# Patient Record
Sex: Female | Born: 1945
Health system: Southern US, Community
[De-identification: ages and names within clinical notes are randomized; demographics above are authoritative.]

## PROBLEM LIST (undated history)

## (undated) DIAGNOSIS — I10 Essential (primary) hypertension: Secondary | ICD-10-CM

## (undated) DIAGNOSIS — R112 Nausea with vomiting, unspecified: Secondary | ICD-10-CM

## (undated) DIAGNOSIS — M1990 Unspecified osteoarthritis, unspecified site: Secondary | ICD-10-CM

## (undated) DIAGNOSIS — H33319 Horseshoe tear of retina without detachment, unspecified eye: Secondary | ICD-10-CM

## (undated) DIAGNOSIS — E785 Hyperlipidemia, unspecified: Secondary | ICD-10-CM

## (undated) DIAGNOSIS — Z9889 Other specified postprocedural states: Secondary | ICD-10-CM

## (undated) HISTORY — PX: CATARACT EXTRACTION: SUR2

## (undated) HISTORY — DX: Hyperlipidemia, unspecified: E78.5

## (undated) HISTORY — DX: Other specified postprocedural states: Z98.890

## (undated) HISTORY — PX: RETINAL TEAR REPAIR CRYOTHERAPY: SHX5304

## (undated) HISTORY — DX: Essential (primary) hypertension: I10

## (undated) HISTORY — DX: Horseshoe tear of retina without detachment, unspecified eye: H33.319

## (undated) HISTORY — DX: Nausea with vomiting, unspecified: R11.2

## (undated) HISTORY — DX: Unspecified osteoarthritis, unspecified site: M19.90

## (undated) HISTORY — PX: POLYPECTOMY: SHX149

---
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
## 2000-06-03 ENCOUNTER — Other Ambulatory Visit: Admission: RE | Admit: 2000-06-03 | Discharge: 2000-06-03 | Payer: Self-pay | Admitting: Obstetrics and Gynecology

## 2000-06-04 ENCOUNTER — Other Ambulatory Visit: Admission: RE | Admit: 2000-06-04 | Discharge: 2000-06-04 | Payer: Self-pay | Admitting: Obstetrics and Gynecology

## 2003-02-05 ENCOUNTER — Emergency Department (HOSPITAL_COMMUNITY): Admission: EM | Admit: 2003-02-05 | Discharge: 2003-02-05 | Payer: Self-pay | Admitting: Emergency Medicine

## 2003-05-03 ENCOUNTER — Other Ambulatory Visit: Admission: RE | Admit: 2003-05-03 | Discharge: 2003-05-03 | Payer: Self-pay | Admitting: Obstetrics and Gynecology

## 2003-05-06 ENCOUNTER — Encounter: Payer: Self-pay | Admitting: Obstetrics and Gynecology

## 2003-05-06 ENCOUNTER — Ambulatory Visit (HOSPITAL_COMMUNITY): Admission: RE | Admit: 2003-05-06 | Discharge: 2003-05-06 | Payer: Self-pay | Admitting: Obstetrics and Gynecology

## 2003-06-04 ENCOUNTER — Encounter: Payer: Self-pay | Admitting: Obstetrics and Gynecology

## 2003-06-04 ENCOUNTER — Encounter: Admission: RE | Admit: 2003-06-04 | Discharge: 2003-06-04 | Payer: Self-pay | Admitting: Obstetrics and Gynecology

## 2003-06-24 ENCOUNTER — Ambulatory Visit (HOSPITAL_COMMUNITY): Admission: RE | Admit: 2003-06-24 | Discharge: 2003-06-24 | Payer: Self-pay | Admitting: Otolaryngology

## 2003-06-24 ENCOUNTER — Encounter (INDEPENDENT_AMBULATORY_CARE_PROVIDER_SITE_OTHER): Payer: Self-pay | Admitting: Specialist

## 2003-06-24 ENCOUNTER — Ambulatory Visit (HOSPITAL_BASED_OUTPATIENT_CLINIC_OR_DEPARTMENT_OTHER): Admission: RE | Admit: 2003-06-24 | Discharge: 2003-06-24 | Payer: Self-pay | Admitting: Otolaryngology

## 2004-07-07 ENCOUNTER — Encounter: Admission: RE | Admit: 2004-07-07 | Discharge: 2004-07-07 | Payer: Self-pay | Admitting: Obstetrics and Gynecology

## 2004-08-09 ENCOUNTER — Ambulatory Visit: Payer: Self-pay | Admitting: Sports Medicine

## 2004-12-20 ENCOUNTER — Ambulatory Visit: Payer: Self-pay | Admitting: Internal Medicine

## 2004-12-22 ENCOUNTER — Ambulatory Visit: Payer: Self-pay | Admitting: Internal Medicine

## 2005-01-05 ENCOUNTER — Ambulatory Visit: Payer: Self-pay | Admitting: Internal Medicine

## 2005-01-26 ENCOUNTER — Ambulatory Visit: Payer: Self-pay | Admitting: Cardiology

## 2005-06-18 ENCOUNTER — Ambulatory Visit: Payer: Self-pay | Admitting: Internal Medicine

## 2005-06-22 ENCOUNTER — Ambulatory Visit: Payer: Self-pay | Admitting: Internal Medicine

## 2005-09-27 ENCOUNTER — Ambulatory Visit: Payer: Self-pay | Admitting: Internal Medicine

## 2005-09-28 ENCOUNTER — Ambulatory Visit: Payer: Self-pay | Admitting: Internal Medicine

## 2005-10-19 ENCOUNTER — Encounter: Admission: RE | Admit: 2005-10-19 | Discharge: 2005-10-19 | Payer: Self-pay | Admitting: Obstetrics and Gynecology

## 2006-04-23 ENCOUNTER — Ambulatory Visit: Payer: Self-pay | Admitting: Internal Medicine

## 2006-04-26 ENCOUNTER — Ambulatory Visit: Payer: Self-pay | Admitting: Internal Medicine

## 2006-08-23 ENCOUNTER — Ambulatory Visit: Payer: Self-pay | Admitting: Internal Medicine

## 2006-08-23 LAB — CONVERTED CEMR LAB
ALT: 35 units/L (ref 0–40)
AST: 25 units/L (ref 0–37)
Albumin: 3.6 g/dL (ref 3.5–5.2)
Alkaline Phosphatase: 68 units/L (ref 39–117)
BUN: 22 mg/dL (ref 6–23)
CO2: 27 meq/L (ref 19–32)
Calcium: 9.2 mg/dL (ref 8.4–10.5)
Chloride: 101 meq/L (ref 96–112)
Chol/HDL Ratio, serum: 4.7
Cholesterol: 222 mg/dL (ref 0–200)
Creatinine, Ser: 0.8 mg/dL (ref 0.4–1.2)
GFR calc non Af Amer: 78 mL/min
Glomerular Filtration Rate, Af Am: 94 mL/min/{1.73_m2}
Glucose, Bld: 115 mg/dL — ABNORMAL HIGH (ref 70–99)
HDL: 47.4 mg/dL (ref >39.0)
LDL DIRECT: 148.9 mg/dL
Potassium: 4.3 meq/L (ref 3.5–5.1)
Sodium: 136 meq/L (ref 135–145)
Total Bilirubin: 0.6 mg/dL (ref 0.3–1.2)
Total Protein: 6.3 g/dL (ref 6.0–8.3)
Triglyceride fasting, serum: 161 mg/dL — ABNORMAL HIGH (ref 0–149)
VLDL: 32 mg/dL (ref 0–40)

## 2006-08-30 ENCOUNTER — Ambulatory Visit: Payer: Self-pay | Admitting: Internal Medicine

## 2006-09-17 DIAGNOSIS — H33319 Horseshoe tear of retina without detachment, unspecified eye: Secondary | ICD-10-CM

## 2006-09-17 HISTORY — DX: Horseshoe tear of retina without detachment, unspecified eye: H33.319

## 2007-01-30 ENCOUNTER — Ambulatory Visit: Payer: Self-pay | Admitting: Internal Medicine

## 2007-01-30 LAB — CONVERTED CEMR LAB
ALT: 40 units/L (ref 0–40)
AST: 23 units/L (ref 0–37)
Albumin: 3.5 g/dL (ref 3.5–5.2)
Alkaline Phosphatase: 63 units/L (ref 39–117)
BUN: 16 mg/dL (ref 6–23)
CO2: 29 meq/L (ref 19–32)
Calcium: 9 mg/dL (ref 8.4–10.5)
Chloride: 100 meq/L (ref 96–112)
Cholesterol: 233 mg/dL (ref 0–200)
Creatinine, Ser: 0.8 mg/dL (ref 0.4–1.2)
Direct LDL: 141 mg/dL
GFR calc Af Amer: 94 mL/min
GFR calc non Af Amer: 78 mL/min
Glucose, Bld: 111 mg/dL — ABNORMAL HIGH (ref 70–99)
HDL: 49.5 mg/dL (ref >39.0)
Potassium: 4.7 meq/L (ref 3.5–5.1)
Sodium: 136 meq/L (ref 135–145)
Total Bilirubin: 0.6 mg/dL (ref 0.3–1.2)
Total CHOL/HDL Ratio: 4.7
Total Protein: 6.6 g/dL (ref 6.0–8.3)
Triglycerides: 165 mg/dL — ABNORMAL HIGH (ref 0–149)
VLDL: 33 mg/dL (ref 0–40)

## 2007-01-31 ENCOUNTER — Ambulatory Visit: Payer: Self-pay | Admitting: Internal Medicine

## 2007-10-03 ENCOUNTER — Telehealth: Payer: Self-pay | Admitting: Internal Medicine

## 2007-10-04 ENCOUNTER — Telehealth: Payer: Self-pay | Admitting: Internal Medicine

## 2007-10-27 ENCOUNTER — Ambulatory Visit: Payer: Self-pay | Admitting: Internal Medicine

## 2007-10-27 DIAGNOSIS — E7800 Pure hypercholesterolemia, unspecified: Secondary | ICD-10-CM

## 2007-10-27 LAB — CONVERTED CEMR LAB: Vit D, 1,25-Dihydroxy: 35 (ref 30–89)

## 2007-10-28 ENCOUNTER — Ambulatory Visit (HOSPITAL_COMMUNITY): Admission: RE | Admit: 2007-10-28 | Discharge: 2007-10-28 | Payer: Self-pay | Admitting: Ophthalmology

## 2007-10-28 LAB — CONVERTED CEMR LAB
BUN: 16 mg/dL (ref 6–23)
CO2: 26 meq/L (ref 19–32)
Calcium: 10 mg/dL (ref 8.4–10.5)
Chloride: 103 meq/L (ref 96–112)
Cholesterol: 285 mg/dL (ref 0–200)
Creatinine, Ser: 0.7 mg/dL (ref 0.4–1.2)
Direct LDL: 196.5 mg/dL
GFR calc Af Amer: 109 mL/min
GFR calc non Af Amer: 90 mL/min
Glucose, Bld: 113 mg/dL — ABNORMAL HIGH (ref 70–99)
HDL: 53.7 mg/dL (ref >39.0)
Hgb A1c MFr Bld: 6 % (ref 4.6–6.0)
Potassium: 3.7 meq/L (ref 3.5–5.1)
Sodium: 138 meq/L (ref 135–145)
Total CHOL/HDL Ratio: 5.3
Triglycerides: 173 mg/dL — ABNORMAL HIGH (ref 0–149)
VLDL: 35 mg/dL (ref 0–40)

## 2007-10-31 ENCOUNTER — Ambulatory Visit: Payer: Self-pay | Admitting: Internal Medicine

## 2007-10-31 DIAGNOSIS — E785 Hyperlipidemia, unspecified: Secondary | ICD-10-CM

## 2007-10-31 DIAGNOSIS — I10 Essential (primary) hypertension: Secondary | ICD-10-CM | POA: Insufficient documentation

## 2007-10-31 DIAGNOSIS — H33039 Retinal detachment with giant retinal tear, unspecified eye: Secondary | ICD-10-CM | POA: Insufficient documentation

## 2008-04-28 ENCOUNTER — Ambulatory Visit: Payer: Self-pay | Admitting: Internal Medicine

## 2008-04-28 ENCOUNTER — Encounter: Payer: Self-pay | Admitting: Internal Medicine

## 2008-04-28 LAB — CONVERTED CEMR LAB
ALT: 43 units/L — ABNORMAL HIGH (ref 0–35)
AST: 32 units/L (ref 0–37)
Albumin: 3.9 g/dL (ref 3.5–5.2)
Alkaline Phosphatase: 65 units/L (ref 39–117)
BUN: 13 mg/dL (ref 6–23)
Basophils Absolute: 0 10*3/uL (ref 0.0–0.1)
Basophils Relative: 0.8 % (ref 0.0–3.0)
Bilirubin, Direct: 0.1 mg/dL (ref 0.0–0.3)
CO2: 29 meq/L (ref 19–32)
Calcium: 9.7 mg/dL (ref 8.4–10.5)
Chloride: 102 meq/L (ref 96–112)
Cholesterol: 286 mg/dL (ref 0–200)
Creatinine, Ser: 0.7 mg/dL (ref 0.4–1.2)
Direct LDL: 213.6 mg/dL
Eosinophils Absolute: 0.1 10*3/uL (ref 0.0–0.7)
Eosinophils Relative: 2 % (ref 0.0–5.0)
GFR calc Af Amer: 109 mL/min
GFR calc non Af Amer: 90 mL/min
Glucose, Bld: 110 mg/dL — ABNORMAL HIGH (ref 70–99)
HCT: 43 % (ref 36.0–46.0)
HDL: 52.6 mg/dL (ref >39.0)
Hemoglobin: 15 g/dL (ref 12.0–15.0)
Hgb A1c MFr Bld: 5.9 % (ref 4.6–6.0)
Lymphocytes Relative: 32.5 % (ref 12.0–46.0)
MCHC: 34.8 g/dL (ref 30.0–36.0)
MCV: 88 fL (ref 78.0–100.0)
Monocytes Absolute: 0.5 10*3/uL (ref 0.1–1.0)
Monocytes Relative: 12 % (ref 3.0–12.0)
Neutro Abs: 2.1 10*3/uL (ref 1.4–7.7)
Neutrophils Relative %: 52.7 % (ref 43.0–77.0)
Platelets: 234 10*3/uL (ref 150–400)
Potassium: 4.1 meq/L (ref 3.5–5.1)
RBC: 4.89 M/uL (ref 3.87–5.11)
RDW: 11.9 % (ref 11.5–14.6)
Sodium: 139 meq/L (ref 135–145)
Total Bilirubin: 0.7 mg/dL (ref 0.3–1.2)
Total CHOL/HDL Ratio: 5.4
Total Protein: 7 g/dL (ref 6.0–8.3)
Triglycerides: 122 mg/dL (ref 0–149)
VLDL: 24 mg/dL (ref 0–40)
Vitamin B-12: 294 pg/mL (ref 211–911)
WBC: 4 10*3/uL — ABNORMAL LOW (ref 4.5–10.5)

## 2008-04-30 ENCOUNTER — Ambulatory Visit: Payer: Self-pay | Admitting: Internal Medicine

## 2008-04-30 DIAGNOSIS — E559 Vitamin D deficiency, unspecified: Secondary | ICD-10-CM

## 2008-05-01 ENCOUNTER — Emergency Department (HOSPITAL_COMMUNITY): Admission: EM | Admit: 2008-05-01 | Discharge: 2008-05-01 | Payer: Self-pay | Admitting: Family Medicine

## 2008-05-03 LAB — CONVERTED CEMR LAB: Vit D, 1,25-Dihydroxy: 39 (ref 30–89)

## 2008-06-22 ENCOUNTER — Encounter: Payer: Self-pay | Admitting: Internal Medicine

## 2008-07-15 ENCOUNTER — Encounter: Payer: Self-pay | Admitting: Internal Medicine

## 2008-07-15 ENCOUNTER — Ambulatory Visit: Payer: Self-pay | Admitting: Internal Medicine

## 2008-07-15 DIAGNOSIS — J018 Other acute sinusitis: Secondary | ICD-10-CM | POA: Insufficient documentation

## 2008-07-15 DIAGNOSIS — R05 Cough: Secondary | ICD-10-CM

## 2008-08-06 ENCOUNTER — Ambulatory Visit: Payer: Self-pay | Admitting: Internal Medicine

## 2008-08-06 DIAGNOSIS — J209 Acute bronchitis, unspecified: Secondary | ICD-10-CM | POA: Insufficient documentation

## 2008-09-28 ENCOUNTER — Ambulatory Visit (HOSPITAL_COMMUNITY): Admission: RE | Admit: 2008-09-28 | Discharge: 2008-09-28 | Payer: Self-pay | Admitting: Ophthalmology

## 2008-12-06 ENCOUNTER — Ambulatory Visit: Payer: Self-pay | Admitting: Internal Medicine

## 2008-12-06 LAB — CONVERTED CEMR LAB
ALT: 28 units/L (ref 0–35)
AST: 22 units/L (ref 0–37)
Albumin: 3.6 g/dL (ref 3.5–5.2)
Alkaline Phosphatase: 47 units/L (ref 39–117)
BUN: 20 mg/dL (ref 6–23)
Basophils Absolute: 0 10*3/uL (ref 0.0–0.1)
Basophils Relative: 0.7 % (ref 0.0–3.0)
Bilirubin, Direct: 0.1 mg/dL (ref 0.0–0.3)
CO2: 29 meq/L (ref 19–32)
Calcium: 9.3 mg/dL (ref 8.4–10.5)
Chloride: 107 meq/L (ref 96–112)
Cholesterol: 272 mg/dL — ABNORMAL HIGH (ref 0–200)
Creatinine, Ser: 0.6 mg/dL (ref 0.4–1.2)
Direct LDL: 173.7 mg/dL
Eosinophils Absolute: 0 10*3/uL (ref 0.0–0.7)
Eosinophils Relative: 1 % (ref 0.0–5.0)
GFR calc non Af Amer: 107.37 mL/min (ref >60)
Glucose, Bld: 122 mg/dL — ABNORMAL HIGH (ref 70–99)
HCT: 40.5 % (ref 36.0–46.0)
HDL: 47.9 mg/dL (ref >39.00)
Hemoglobin: 14 g/dL (ref 12.0–15.0)
Hgb A1c MFr Bld: 5.9 % (ref 4.6–6.5)
Lymphocytes Relative: 33.8 % (ref 12.0–46.0)
Lymphs Abs: 1.6 10*3/uL (ref 0.7–4.0)
MCHC: 34.6 g/dL (ref 30.0–36.0)
MCV: 88.1 fL (ref 78.0–100.0)
Monocytes Absolute: 0.3 10*3/uL (ref 0.1–1.0)
Monocytes Relative: 6 % (ref 3.0–12.0)
Neutro Abs: 2.9 10*3/uL (ref 1.4–7.7)
Neutrophils Relative %: 58.5 % (ref 43.0–77.0)
Platelets: 229 10*3/uL (ref 150.0–400.0)
Potassium: 4.6 meq/L (ref 3.5–5.1)
RBC: 4.6 M/uL (ref 3.87–5.11)
RDW: 11.8 % (ref 11.5–14.6)
Sodium: 143 meq/L (ref 135–145)
Total Bilirubin: 0.5 mg/dL (ref 0.3–1.2)
Total CHOL/HDL Ratio: 6
Total Protein: 6.6 g/dL (ref 6.0–8.3)
Triglycerides: 200 mg/dL — ABNORMAL HIGH (ref 0.0–149.0)
VLDL: 40 mg/dL (ref 0.0–40.0)
WBC: 4.8 10*3/uL (ref 4.5–10.5)

## 2008-12-10 ENCOUNTER — Ambulatory Visit: Payer: Self-pay | Admitting: Internal Medicine

## 2009-07-13 ENCOUNTER — Telehealth: Payer: Self-pay | Admitting: Internal Medicine

## 2009-08-15 ENCOUNTER — Ambulatory Visit: Payer: Self-pay | Admitting: Internal Medicine

## 2009-08-15 LAB — CONVERTED CEMR LAB
ALT: 32 units/L (ref 0–35)
AST: 24 units/L (ref 0–37)
Albumin: 3.5 g/dL (ref 3.5–5.2)
Alkaline Phosphatase: 68 units/L (ref 39–117)
BUN: 17 mg/dL (ref 6–23)
Bilirubin, Direct: 0 mg/dL (ref 0.0–0.3)
CO2: 28 meq/L (ref 19–32)
Calcium: 9.1 mg/dL (ref 8.4–10.5)
Chloride: 104 meq/L (ref 96–112)
Cholesterol: 274 mg/dL — ABNORMAL HIGH (ref 0–200)
Creatinine, Ser: 0.7 mg/dL (ref 0.4–1.2)
Direct LDL: 209.1 mg/dL
GFR calc non Af Amer: 89.67 mL/min (ref >60)
Glucose, Bld: 108 mg/dL — ABNORMAL HIGH (ref 70–99)
HDL: 55.2 mg/dL (ref >39.00)
Hgb A1c MFr Bld: 5.8 % (ref 4.6–6.5)
Potassium: 4.4 meq/L (ref 3.5–5.1)
Sodium: 139 meq/L (ref 135–145)
TSH: 0.64 microintl units/mL (ref 0.35–5.50)
Total Bilirubin: 0.8 mg/dL (ref 0.3–1.2)
Total CHOL/HDL Ratio: 5
Total Protein: 6.5 g/dL (ref 6.0–8.3)
Triglycerides: 98 mg/dL (ref 0.0–149.0)
VLDL: 19.6 mg/dL (ref 0.0–40.0)

## 2009-08-19 ENCOUNTER — Ambulatory Visit: Payer: Self-pay | Admitting: Internal Medicine

## 2009-08-19 DIAGNOSIS — M79609 Pain in unspecified limb: Secondary | ICD-10-CM | POA: Insufficient documentation

## 2009-08-19 DIAGNOSIS — F4321 Adjustment disorder with depressed mood: Secondary | ICD-10-CM | POA: Insufficient documentation

## 2010-03-07 ENCOUNTER — Ambulatory Visit: Payer: Self-pay | Admitting: Internal Medicine

## 2010-03-07 LAB — CONVERTED CEMR LAB
ALT: 25 units/L (ref 0–35)
AST: 22 units/L (ref 0–37)
Albumin: 3.9 g/dL (ref 3.5–5.2)
Alkaline Phosphatase: 66 units/L (ref 39–117)
BUN: 14 mg/dL (ref 6–23)
Bilirubin, Direct: 0.1 mg/dL (ref 0.0–0.3)
CO2: 29 meq/L (ref 19–32)
Calcium: 9.1 mg/dL (ref 8.4–10.5)
Chloride: 108 meq/L (ref 96–112)
Creatinine, Ser: 0.7 mg/dL (ref 0.4–1.2)
GFR calc non Af Amer: 91.01 mL/min (ref >60)
Glucose, Bld: 102 mg/dL — ABNORMAL HIGH (ref 70–99)
Potassium: 4.5 meq/L (ref 3.5–5.1)
Sodium: 143 meq/L (ref 135–145)
TSH: 0.74 microintl units/mL (ref 0.35–5.50)
Total Bilirubin: 0.6 mg/dL (ref 0.3–1.2)
Total Protein: 6.8 g/dL (ref 6.0–8.3)

## 2010-03-10 ENCOUNTER — Ambulatory Visit: Payer: Self-pay | Admitting: Internal Medicine

## 2010-06-30 ENCOUNTER — Ambulatory Visit: Payer: Self-pay | Admitting: Internal Medicine

## 2010-10-19 NOTE — Assessment & Plan Note (Signed)
Summary: 6 mos f/u // #?cd   Vital Signs:  Patient profile:   65 year old female Height:      60 inches Weight:      139 pounds BMI:     27.24 O2 Sat:      95 % on Room air Temp:     97.6 degrees F oral Resp:     16 per minute BP sitting:   110 / 60  (left arm) Cuff size:   regular  Vitals Entered By: Lanier Prude, CMA(AAMA) (March 10, 2010 2:24 PM)  O2 Flow:  Room air CC: 6 mo f/u Comments pt is not taking Lipitor 10mg  or Pennsaid    CC:  6 mo f/u.  History of Present Illness: The patient presents for a follow up of hypertension, elev glu, hyperlipidemia   Current Medications (verified): 1)  Zestoretic 10-12.5 Mg  Tabs (Lisinopril-Hydrochlorothiazide) .Marland Kitchen.. 1 By Mouth Qd 2)  Caltrate 600+d 600-400 Mg-Unit  Tabs (Calcium Carbonate-Vitamin D) .... 2 By Mouth Qd 3)  Vitamin D 1000 Unit  Tabs (Cholecalciferol) .... Two Times A Day 4)  Fish Oil 1000 Mg  Caps (Omega-3 Fatty Acids) .... Two Times A Day 5)  B-12 250 Mcg  Tabs (Cyanocobalamin) .Marland Kitchen.. 1 By Mouth Qd 6)  Aspirin 81 Mg  Tbec (Aspirin) .... One By Mouth Every Day 7)  Lipitor 10 Mg Tabs (Atorvastatin Calcium) .Marland Kitchen.. 1 By Mouth Qd 8)  Pennsaid 1.5 % Soln (Diclofenac Sodium) .... 3-5 Gtt On Skin Three Times A Day For Pain 9)  Flax Seed Oil 1000 Mg Caps (Flaxseed (Linseed)) .Marland Kitchen.. 1 Once Daily  Allergies (verified): 1)  ! Fish Oil (Fish Oil) 2)  Mevacor 3)  Crestor (Rosuvastatin Calcium) 4)  Vytorin 5)  Lipitor  Past History:  Past Medical History: Last updated: 04/30/2008 Hyperlipidemia Hypertension Cataract R 2009 Dr Nile Riggs Retina tear B 2008 Dr Gregery Na Vit D def Low nl  Vit B12  Social History: Last updated: 07/15/2008 Occupation: Nutritionist Single Never Smoked Alcohol use-no Drug use-no Regular exercise-yes  Physical Exam  General:  Well-developed,well-nourished,in no acute distress; alert,appropriate and cooperative throughout examination Nose:  Erythematous throat mucosa and intranasal  erythema.  Mouth:  hoarse Lungs:  Normal respiratory effort, chest expands symmetrically. Lungs are clear to auscultation, no crackles or wheezes. Heart:  Normal rate and regular rhythm. S1 and S2 normal without gallop, murmur, click, rub or other extra sounds. Abdomen:  Bowel sounds positive,abdomen soft and non-tender without masses, organomegaly or hernias noted. Msk:  normal ROM.   Extremities:  No clubbing, cyanosis, edema, or deformity noted with normal full range of motion of all joints.   Neurologic:  No cranial nerve deficits noted. Station and gait are normal. Plantar reflexes are down-going bilaterally. DTRs are symmetrical throughout. Sensory, motor and coordinative functions appear intact. Skin:  turgor normal, color normal, no rashes, and no edema.   Psych:  Cognition and judgment appear intact. Alert and cooperative with normal attention span and concentration. No apparent delusions, illusions, hallucinations Not tearful.     Impression & Recommendations:  Problem # 1:  HYPERTENSION (ICD-401.9) Assessment Improved  Her updated medication list for this problem includes:    Zestoretic 10-12.5 Mg Tabs (Lisinopril-hydrochlorothiazide) .Marland Kitchen... 1 by mouth qd  BP today: 110/60 Prior BP: 144/90 (08/19/2009)  Prior 10 Yr Risk Heart Disease: Not enough information (07/15/2008)  Labs Reviewed: K+: 4.5 (03/07/2010) Creat: : 0.7 (03/07/2010)   Chol: 274 (08/15/2009)   HDL: 55.20 (08/15/2009)   LDL: DEL (  04/28/2008)   TG: 98.0 (08/15/2009)  Problem # 2:  ABNORMAL GLUCOSE NEC (ICD-790.29) Assessment: Unchanged  Problem # 3:  HYPERLIPIDEMIA (ICD-272.4) Assessment: Comment Only  Her updated medication list for this problem includes:    Lipitor 10 Mg Tabs (Atorvastatin calcium) .Marland Kitchen... 1 by mouth qd    Livalo 4 Mg Tabs (Pitavastatin calcium) .Marland Kitchen... 1 by mouth once daily to try  Complete Medication List: 1)  Zestoretic 10-12.5 Mg Tabs (Lisinopril-hydrochlorothiazide) .Marland Kitchen.. 1 by mouth  qd 2)  Caltrate 600+d 600-400 Mg-unit Tabs (Calcium carbonate-vitamin d) .... 2 by mouth qd 3)  Vitamin D 1000 Unit Tabs (Cholecalciferol) .... Two times a day 4)  Fish Oil 1000 Mg Caps (Omega-3 fatty acids) .... Two times a day 5)  B-12 250 Mcg Tabs (Cyanocobalamin) .Marland Kitchen.. 1 by mouth qd 6)  Aspirin 81 Mg Tbec (Aspirin) .... One by mouth every day 7)  Lipitor 10 Mg Tabs (Atorvastatin calcium) .Marland Kitchen.. 1 by mouth qd 8)  Pennsaid 1.5 % Soln (Diclofenac sodium) .... 3-5 gtt on skin three times a day for pain 9)  Flax Seed Oil 1000 Mg Caps (Flaxseed (linseed)) .Marland Kitchen.. 1 once daily 10)  Livalo 4 Mg Tabs (Pitavastatin calcium) .Marland Kitchen.. 1 by mouth qd  Other Orders: Zoster (Shingles) Vaccine Live [phone]) Admin 1st Vaccine (60454) Admin 1st Vaccine [facility]) [phone])  Patient Instructions: 1)  Please schedule a follow-up appointment in 4 months. 2)  BMP prior to visit, ICD-9: 3)  Hepatic Panel prior to visit, ICD-9: 4)  Lipid Panel prior to visit, ICD-9: 5)  HbgA1C prior to visit, ICD-9:790.29  272.0 Prescriptions: ZESTORETIC 10-12.5 MG  TABS (LISINOPRIL-HYDROCHLOROTHIAZIDE) 1 by mouth qd  #90 x 3   Entered and Authorized by:   Tresa Garter MD   Signed by:   Tresa Garter MD on 03/10/2010   Method used:   Electronically to        [facility]* (retail)       [address]       [address] Shipping/mailing       [place], [place]  [address]       Ph: [phone]       Fax: [phone]   RxID:   [id number]    Zostavax # 1    Vaccine Type: Zostavax    Site: left deltoid    Mfr: Merck    Dose: 0.5 ml    Route: Bison    Given by: Lanier Prude, CMA(AAMA)    Exp. Date: 02/24/2011    Lot #: [id number]    VIS given: 06/29/05 given March 10, 2010.

## 2011-01-01 LAB — BASIC METABOLIC PANEL
BUN: 16 mg/dL (ref 6–23)
CO2: 29 mEq/L (ref 19–32)
Calcium: 10 mg/dL (ref 8.4–10.5)
Chloride: 100 mEq/L (ref 96–112)
Creatinine, Ser: 0.6 mg/dL (ref 0.4–1.2)
GFR calc Af Amer: >60 mL/min (ref >60)
GFR calc non Af Amer: >60 mL/min (ref >60)
Glucose, Bld: 134 mg/dL — ABNORMAL HIGH (ref 70–99)
Potassium: 3.9 mEq/L (ref 3.5–5.1)
Sodium: 139 mEq/L (ref 135–145)

## 2011-01-01 LAB — GLUCOSE, CAPILLARY: Glucose-Capillary: 105 mg/dL — ABNORMAL HIGH (ref 70–99)

## 2011-01-01 LAB — HEMOGLOBIN AND HEMATOCRIT, BLOOD
HCT: 42.5 % (ref 36.0–46.0)
Hemoglobin: 14.3 g/dL (ref 12.0–15.0)

## 2011-02-02 NOTE — H&P (Signed)
Lisa [name], Lisa [name]                            ACCOUNT NO.:  [id number]   MEDICAL RECORD NO.:  [id number]                   PATIENT TYPE:  AMB   LOCATION:  DSC                                  FACILITY:  [facility]   PHYSICIAN:  Hermelinda Medicus, M.D.                DATE OF BIRTH:  02-09-1946   DATE OF ADMISSION:  06/24/2003  DATE OF DISCHARGE:                                HISTORY & PHYSICAL   HISTORY OF PRESENT ILLNESS:  This patient is a 65 year old female who has  had difficulty breathing through her nose for several years.  She has a  septal deviation.  She has also had associated epistaxis.  She has had some  easy bruising.  She has been evaluated and found to be in excellent  condition.  Her coagulation status showed a pro time of 12, an INR of 0.8,  and a PTT of 31.  With this bleeding, she would have crusting within her  nose.  I think that the angulation of the septum was aggravating this  considerably.  She now enters for a septum reconstruction and turbinate  reduction in an effort to minimize the bleeding from her nose and also to  maximize her breathing.   MEDICATIONS:  1. Lotensin HC 10/12.5 mg daily.  2. Crestor 20 mg daily.  3. Calcium 600 mg daily.  4. Vitamin C.   ALLERGIES:  She has an allergy to DENTAL ANESTHETIC, TOPICAL, causing  blisters.   PAST SURGICAL HISTORY:  1. Polypectomy of her cervix.  2. Appendectomy.   PHYSICAL EXAMINATION:  GENERAL APPEARANCE:  She is a well-nourished, well-  developed female in no acute distress.  VITAL SIGNS:  Blood pressure 117/70.  WEIGHT:  131 pounds.  HEIGHT:  5 feet 1 inch.  HEENT:  The ears are clear.  The oropharynx is clear.  The nose shows a  septal deviation where the septum was pushed over to her right and then as a  severe spur more posteriorly in the vomerine region, but has also the high  ethmoid septal deviation.  The turbinates are also hypertrophied.  The  larynx is clear.  The true cords, false cords,  epiglottis, and base of  tongue are clear.  True cord mobility, gag reflex, tongue, EOMs, and facial  nerve are symmetrical.  No salivary gland abnormalities.  The teeth and gums  are within normal limits.  NECK:  Free of any thyromegaly, cervical adenopathy, or mass.  CHEST:  Clear.  No rales, rhonchi, or wheezes.  CARDIOVASCULAR:  No murmurs, rubs, or gallops.  She does have a questionable  infarction on EKG, age undetermined.  ABDOMEN:  Unremarkable.  EXTREMITIES:  Unremarkable.   INITIAL DIAGNOSES:  1. Septal deviation.  2. Turbinate hypertrophy with nasal obstruction.  3. History of epistaxis.  4. History of undetermined timeframe for a septal infarction.  5. History of appendectomy.  6.  History of cervical polypectomy.  7. History of allergy to dental topical medication.                                                Hermelinda Medicus, M.D.    JC/MEDQ  D:  06/24/2003  T:  06/24/2003  Job:  [id number]   cc:   Georgina Quint. [name], M.D. [facility]

## 2011-02-02 NOTE — Op Note (Signed)
   [name], [name]                            ACCOUNT NO.:  [id number]   MEDICAL RECORD NO.:  [id number]                   PATIENT TYPE:  AMB   LOCATION:  DSC                                  FACILITY:  [facility]   PHYSICIAN:  Hermelinda Medicus, M.D.                DATE OF BIRTH:  1946/04/18   DATE OF PROCEDURE:  06/24/2003  DATE OF DISCHARGE:                                 OPERATIVE REPORT   PREOPERATIVE DIAGNOSIS:  Septal deviation, turbinate hypertrophy with  epistaxis with nasal obstruction.   POSTOPERATIVE DIAGNOSIS:  Septal deviation, turbinate hypertrophy with  epistaxis with nasal obstruction.   PROCEDURE:  Septal reconstruction, turbinate reduction.   SURGEON:  Hermelinda Medicus, M.D.   ANESTHESIA:  Local with MAC.   DESCRIPTION OF PROCEDURE:  The patient was placed in the supine position and  under local anesthesia (1% Xylocaine with epinephrine) injectable, and  topical cocaine 200 mg, the hemitransfixion incision was made on the left  side, carried around to the right side and carried back along the  quadrilateral cartilage to the high ethmoid septal deviation which was  corrected by removing a strip of cartilage.  A posterior aspect cartilage  was also taken and then the ethmoid septal deviation was also taken using  the open and closed Hansen-Middletons and Takahashi forceps.  There was a  severe vomerine septal deviation that was also taken down using the  [facility] forceps.  Once this was brought back into position, the septum was  established in the midline and then the closure was begun using 5-0 plain  catgut and the through and through septal suture using 4-0 plain x2 as a  hemostatic stitch as well as to minimize swelling.  The patient tolerated  the procedure very well and is doing well postoperatively.  Intranasal  dressing was placed using Maricell pack 8 cm and she is to return to see me  in the office tomorrow, then Monday, then in 2-1/2 weeks, and 6  weeks.                                               Hermelinda Medicus, M.D.    JC/MEDQ  D:  06/24/2003  T:  06/24/2003  Job:  [id number]   cc:   Georgina Quint. [name], M.D. [facility]

## 2011-02-05 ENCOUNTER — Other Ambulatory Visit: Payer: Self-pay | Admitting: Internal Medicine

## 2011-02-05 ENCOUNTER — Other Ambulatory Visit (INDEPENDENT_AMBULATORY_CARE_PROVIDER_SITE_OTHER): Payer: Commercial Managed Care - PPO | Admitting: Internal Medicine

## 2011-02-05 ENCOUNTER — Other Ambulatory Visit (INDEPENDENT_AMBULATORY_CARE_PROVIDER_SITE_OTHER): Payer: Commercial Managed Care - PPO

## 2011-02-05 DIAGNOSIS — Z0000 Encounter for general adult medical examination without abnormal findings: Secondary | ICD-10-CM

## 2011-02-05 DIAGNOSIS — Z13220 Encounter for screening for lipoid disorders: Secondary | ICD-10-CM

## 2011-02-05 LAB — BASIC METABOLIC PANEL
BUN: 16 mg/dL (ref 6–23)
CO2: 28 mEq/L (ref 19–32)
Calcium: 9.3 mg/dL (ref 8.4–10.5)
Chloride: 99 mEq/L (ref 96–112)
Creatinine, Ser: 0.6 mg/dL (ref 0.4–1.2)
GFR: 110.89 mL/min (ref >60.00)
Glucose, Bld: 95 mg/dL (ref 70–99)
Potassium: 4.5 mEq/L (ref 3.5–5.1)
Sodium: 136 mEq/L (ref 135–145)

## 2011-02-05 LAB — TSH: TSH: 0.9 u[IU]/mL (ref 0.35–5.50)

## 2011-02-05 LAB — URINALYSIS
Bilirubin Urine: NEGATIVE
Hgb urine dipstick: NEGATIVE
Ketones, ur: NEGATIVE
Leukocytes, UA: NEGATIVE
Nitrite: NEGATIVE
Specific Gravity, Urine: <=1.005 (ref 1.000–1.030)
Total Protein, Urine: NEGATIVE
Urine Glucose: NEGATIVE
Urobilinogen, UA: 0.2 (ref 0.0–1.0)
pH: 7.5 (ref 5.0–8.0)

## 2011-02-05 LAB — CBC WITH DIFFERENTIAL/PLATELET
Basophils Absolute: 0 10*3/uL (ref 0.0–0.1)
Basophils Relative: 0.2 % (ref 0.0–3.0)
Eosinophils Absolute: 0.1 10*3/uL (ref 0.0–0.7)
Eosinophils Relative: 1.3 % (ref 0.0–5.0)
HCT: 42 % (ref 36.0–46.0)
Hemoglobin: 14.3 g/dL (ref 12.0–15.0)
Lymphocytes Relative: 28.1 % (ref 12.0–46.0)
Lymphs Abs: 1.6 10*3/uL (ref 0.7–4.0)
MCHC: 34.1 g/dL (ref 30.0–36.0)
MCV: 89.4 fl (ref 78.0–100.0)
Monocytes Absolute: 0.5 10*3/uL (ref 0.1–1.0)
Monocytes Relative: 8 % (ref 3.0–12.0)
Neutro Abs: 3.6 10*3/uL (ref 1.4–7.7)
Neutrophils Relative %: 62.4 % (ref 43.0–77.0)
Platelets: 257 10*3/uL (ref 150.0–400.0)
RBC: 4.7 Mil/uL (ref 3.87–5.11)
RDW: 12.9 % (ref 11.5–14.6)
WBC: 5.8 10*3/uL (ref 4.5–10.5)

## 2011-02-05 LAB — HEPATIC FUNCTION PANEL
ALT: 22 U/L (ref 0–35)
AST: 17 U/L (ref 0–37)
Albumin: 3.7 g/dL (ref 3.5–5.2)
Alkaline Phosphatase: 57 U/L (ref 39–117)
Bilirubin, Direct: 0.1 mg/dL (ref 0.0–0.3)
Total Bilirubin: 0.5 mg/dL (ref 0.3–1.2)
Total Protein: 6.3 g/dL (ref 6.0–8.3)

## 2011-02-05 LAB — LIPID PANEL
Cholesterol: 245 mg/dL — ABNORMAL HIGH (ref 0–200)
HDL: 59.3 mg/dL (ref >39.00)
Total CHOL/HDL Ratio: 4
Triglycerides: 168 mg/dL — ABNORMAL HIGH (ref 0.0–149.0)
VLDL: 33.6 mg/dL (ref 0.0–40.0)

## 2011-02-05 LAB — LDL CHOLESTEROL, DIRECT: Direct LDL: 183.5 mg/dL

## 2011-02-09 ENCOUNTER — Encounter: Payer: Self-pay | Admitting: Internal Medicine

## 2011-02-09 ENCOUNTER — Ambulatory Visit (INDEPENDENT_AMBULATORY_CARE_PROVIDER_SITE_OTHER): Payer: 59 | Admitting: Internal Medicine

## 2011-02-09 DIAGNOSIS — Z0000 Encounter for general adult medical examination without abnormal findings: Secondary | ICD-10-CM

## 2011-02-09 DIAGNOSIS — E559 Vitamin D deficiency, unspecified: Secondary | ICD-10-CM

## 2011-02-09 DIAGNOSIS — I10 Essential (primary) hypertension: Secondary | ICD-10-CM

## 2011-02-09 DIAGNOSIS — E538 Deficiency of other specified B group vitamins: Secondary | ICD-10-CM

## 2011-02-09 DIAGNOSIS — R7309 Other abnormal glucose: Secondary | ICD-10-CM

## 2011-02-09 DIAGNOSIS — E785 Hyperlipidemia, unspecified: Secondary | ICD-10-CM

## 2011-02-09 MED ORDER — LISINOPRIL-HYDROCHLOROTHIAZIDE 10-12.5 MG PO TABS: 1.0000 | ORAL_TABLET | Freq: Every day | ORAL | Status: DC

## 2011-02-09 MED ORDER — METFORMIN HCL 500 MG PO TABS: 500.0000 mg | ORAL_TABLET | Freq: Two times a day (BID) | ORAL | Status: DC

## 2011-02-09 NOTE — Assessment & Plan Note (Signed)
On Rx 

## 2011-02-09 NOTE — Assessment & Plan Note (Signed)
On rx 

## 2011-02-09 NOTE — Assessment & Plan Note (Signed)
Use Metformin 1/2 or 1 qd

## 2011-02-09 NOTE — Progress Notes (Signed)
  Subjective:    Patient ID: Lisa [name], female    DOB: 01-13-1946, 65 y.o.   MRN: [id number]  HPI  The patient presents for a follow-up of  chronic hypertension, chronic dyslipidemia and elev glu controlled with diet some   Review of Systems  Constitutional: Negative for appetite change.  HENT: Negative for sneezing.   Respiratory: Negative for cough and choking.   Gastrointestinal: Negative for anal bleeding.  Genitourinary: Negative for decreased urine volume.  Musculoskeletal: Negative for back pain.  Skin: Negative for color change.  Neurological: Negative for numbness.  Psychiatric/Behavioral: Negative for dysphoric mood.       Wt Readings from Last 3 Encounters:  02/09/11 133 lb (60.328 kg)  03/10/10 139 lb (63.05 kg)  08/19/09 135 lb (61.236 kg)    Objective:   Physical Exam  Constitutional: She appears well-developed and well-nourished. No distress.  HENT:  Head: Normocephalic.  Right Ear: External ear normal.  Left Ear: External ear normal.  Nose: Nose normal.  Mouth/Throat: Oropharynx is clear and moist.  Eyes: Conjunctivae are normal. Pupils are equal, round, and reactive to light. Right eye exhibits no discharge. Left eye exhibits no discharge.  Neck: Normal range of motion. Neck supple. No JVD present. No tracheal deviation present. No thyromegaly present.  Cardiovascular: Normal rate, regular rhythm and normal heart sounds.   Pulmonary/Chest: No stridor. No respiratory distress. She has no wheezes.  Abdominal: Soft. Bowel sounds are normal. She exhibits no distension and no mass. There is no tenderness. There is no rebound and no guarding.  Musculoskeletal: She exhibits no edema and no tenderness.  Lymphadenopathy:    She has no cervical adenopathy.  Neurological: She displays normal reflexes. No cranial nerve deficit. She exhibits normal muscle tone. Coordination normal.  Skin: No rash noted. No erythema.  Psychiatric: She has a normal mood and affect.  Her behavior is normal. Judgment and thought content normal.        Lab Results  Component Value Date   WBC 5.8 02/05/2011   HGB 14.3 02/05/2011   HCT 42.0 02/05/2011   PLT 257.0 02/05/2011   CHOL 245* 02/05/2011   TRIG 168.0* 02/05/2011   HDL 59.30 02/05/2011   LDLDIRECT 183.5 02/05/2011   ALT 22 02/05/2011   AST 17 02/05/2011   NA 136 02/05/2011   K 4.5 02/05/2011   CL 99 02/05/2011   CREATININE 0.6 02/05/2011   BUN 16 02/05/2011   CO2 28 02/05/2011   TSH 0.90 02/05/2011   HGBA1C 5.8 08/15/2009      Assessment & Plan:  VITAMIN D DEFICIENCY On rx  ABNORMAL GLUCOSE NEC Use Metformin 1/2 or 1 qd  HYPERTENSION On Rx

## 2011-06-08 LAB — BASIC METABOLIC PANEL
BUN: 9
Chloride: 102
GFR calc Af Amer: >60
GFR calc non Af Amer: >60
Potassium: 4.6
Sodium: 137

## 2011-06-08 LAB — HEMOGLOBIN AND HEMATOCRIT, BLOOD
HCT: 43.4
Hemoglobin: 14.8

## 2011-07-30 ENCOUNTER — Ambulatory Visit: Payer: 59 | Admitting: Internal Medicine

## 2011-09-19 ENCOUNTER — Ambulatory Visit (INDEPENDENT_AMBULATORY_CARE_PROVIDER_SITE_OTHER): Payer: 59 | Admitting: Internal Medicine

## 2011-09-19 ENCOUNTER — Encounter: Payer: Self-pay | Admitting: Internal Medicine

## 2011-09-19 VITALS — BP 136/72 | HR 101 | Temp 98.5°F

## 2011-09-19 DIAGNOSIS — J209 Acute bronchitis, unspecified: Secondary | ICD-10-CM

## 2011-09-19 MED ORDER — HYDROCOD POLST-CHLORPHEN POLST 10-8 MG/5ML PO LQCR: 5.0000 mL | Freq: Two times a day (BID) | ORAL | Status: DC | PRN

## 2011-09-19 MED ORDER — AZITHROMYCIN 250 MG PO TABS: ORAL_TABLET | ORAL | Status: AC

## 2011-09-19 NOTE — Progress Notes (Signed)
  Subjective:    HPI  complains of cold symptoms  Onset >1 week ago, wax/wane symptoms  Initially associated with rhinorrhea, sneezing, sore throat, mild headache and low grade fever Also myalgias, sinus pressure and mild-mod chest congestion No relief with OTC meds Precipitated by sick contacts  Past Medical History  Diagnosis Date  . Hyperlipidemia   . Hypertension   . Cataract 2009    right-Dr. Nile Riggs  . Retinal tear 2008    B- Dr. Gregery Na  . Vitamin D deficiency     Review of Systems Constitutional: No fever or night sweats, no unexpected weight change Pulmonary: No pleurisy or hemoptysis Cardiovascular: No chest pain or palpitations     Objective:   Physical Exam BP 136/72  Pulse 101  Temp(Src) 98.5 F (36.9 C) (Oral)  SpO2 98% GEN: mildly ill appearing and audible head/chest congestion HENT: NCAT, mild sinus tenderness bilaterally, nares with clear discharge, oropharynx mild erythema, no exudate Eyes: Vision grossly intact, no conjunctivitis Lungs: Clear to auscultation, few rhonchi  But no wheeze, no increased work of breathing Cardiovascular: Regular rate and rhythm, no bilateral edema      Assessment & Plan:  Viral URI  Acute bronchitis Cough, postnasal drip related to above   Empiric antibiotics prescribed due to symptom duration greater than 7 days Prescription cough suppression - new prescriptions done Symptomatic care with Tylenol or Advil, hydration and rest -  salt gargle advised as needed

## 2011-09-19 NOTE — Patient Instructions (Signed)
It was good to see you today. Zpak antibiotics and Tussionex syrup for nighttime cough symptoms - Your prescription(s) have been submitted to your pharmacy. Please take as directed and contact our office if you believe you are having problem(s) with the medication(s). Also use Mucinex for cough, stay hydrated and rest!

## 2011-10-05 ENCOUNTER — Ambulatory Visit: Payer: 59 | Admitting: Internal Medicine

## 2011-11-20 ENCOUNTER — Ambulatory Visit (INDEPENDENT_AMBULATORY_CARE_PROVIDER_SITE_OTHER): Payer: 59 | Admitting: Internal Medicine

## 2011-11-20 ENCOUNTER — Encounter: Payer: Self-pay | Admitting: Internal Medicine

## 2011-11-20 VITALS — BP 136/88 | HR 106 | Temp 98.4°F | Resp 16 | Wt 129.8 lb

## 2011-11-20 DIAGNOSIS — R05 Cough: Secondary | ICD-10-CM

## 2011-11-20 MED ORDER — PREDNISONE 10 MG PO TABS: 10.0000 mg | ORAL_TABLET | Freq: Every day | ORAL | Status: DC

## 2011-11-20 MED ORDER — BENZONATATE 100 MG PO CAPS: 100.0000 mg | ORAL_CAPSULE | Freq: Three times a day (TID) | ORAL | Status: AC

## 2011-11-20 NOTE — Progress Notes (Signed)
  Subjective:    Patient ID: Lisa [name], female    DOB: 06/19/1946, 66 y.o.   MRN: [id number]  HPI Lisa [name] presents for persistent cough for 5 days with minimal production of mucus/sputum. She has had no fever or chills, no SOB/DOE. She was seen in Jan '13 diagnosed by Dr. Felicity Coyer with  Viral URI and bronchitis treated with z--pak and tussionex. She did improve but had an intermittent cough. Late last week she saw a patient who was sick and soon thereafter her symptoms got a lot worse with hacking cough that interferes with sleep, sinus pressure and post nasal drainage for which she has been taking benadryl at night.   Past Medical History  Diagnosis Date  . Hyperlipidemia   . Hypertension   . Cataract 2009    right-Dr. Nile Riggs  . Retinal tear 2008    B- Dr. Gregery Na  . Vitamin d deficiency    Past Surgical History  Procedure Date  . Cataract extraction     Dr. Nile Riggs  . Retinal tear repair cryotherapy     Dr. Gregery Na   Family History  Problem Relation Age of Onset  . COPD Father   . COPD Sister   . Hyperlipidemia Other    History   Social History  . Marital Status: Single    Spouse Name: N/A    Number of Children: N/A  . Years of Education: N/A   Occupational History  . nutritionist    Social History Main Topics  . Smoking status: Never Smoker   . Smokeless tobacco: Not on file  . Alcohol Use: No  . Drug Use: No  . Sexually Active: Not on file   Other Topics Concern  . Not on file   Social History Narrative   Regular exercise- Yes       Review of Systems System review is negative for any constitutional, cardiac, pulmonary, GI or neuro symptoms or complaints other than as described in the HPI.     Objective:   Physical Exam Filed Vitals:   11/20/11 1313  BP: 136/88  Pulse: 106  Temp: 98.4 F (36.9 C)  Resp: 16  O2 sat 97% Gen'l0- WNWD white woman in no distress but with a hacking cough HEENT - TMs normal, throat clear, minimal sinus  tenderness to percussion. PUlm - no increased WOB, no wheezing or rales on careful exam, no egophony or fremitus. Cor - RRR Neuro A&O x 3, CN II-XII ok.       Assessment & Plan:  Cough - no evidence of bacterial infection. Suspect viral URI with cyclical cough.  Plan - prom/cod 1 tsp q 4-6; tessalon perles tid;  Prednisone burst and taper (she prefers to start at second step - 20 mg qd x 3, then 10 mg qd x 6); mucinex 1200 mg bid; sudafed 30 mg bid.           Call for fever, purulent drainage, SOB

## 2011-11-20 NOTE — Patient Instructions (Signed)
Persistent cough - no evidence of bacterial infection. Suspect a cyclical cough with tracheal irritation.  Plan - promethazine with codeine 1 tsp q 4-6 hrs during the day, up to 2 tsp at bedtime; sudafed 30 mg two times a day; prednisone burst and taper for the tracheal irritation, mucinex 1200 mg twice a day; tessalon perles 100 mg three times a day for irritative cough. Call for fever, bitter/metallic tasting sputum, shortness of breath.

## 2011-12-24 ENCOUNTER — Other Ambulatory Visit (INDEPENDENT_AMBULATORY_CARE_PROVIDER_SITE_OTHER): Payer: 59

## 2011-12-24 DIAGNOSIS — E538 Deficiency of other specified B group vitamins: Secondary | ICD-10-CM

## 2011-12-24 DIAGNOSIS — Z0000 Encounter for general adult medical examination without abnormal findings: Secondary | ICD-10-CM

## 2011-12-24 LAB — CBC WITH DIFFERENTIAL/PLATELET
Basophils Absolute: 0 K/uL (ref 0.0–0.1)
Basophils Relative: 0.5 % (ref 0.0–3.0)
Eosinophils Absolute: 0.1 K/uL (ref 0.0–0.7)
Eosinophils Relative: 1.7 % (ref 0.0–5.0)
HCT: 40.8 % (ref 36.0–46.0)
Hemoglobin: 13.6 g/dL (ref 12.0–15.0)
Lymphocytes Relative: 35.7 % (ref 12.0–46.0)
Lymphs Abs: 1.5 K/uL (ref 0.7–4.0)
MCHC: 33.3 g/dL (ref 30.0–36.0)
MCV: 88.3 fl (ref 78.0–100.0)
Monocytes Absolute: 0.4 K/uL (ref 0.1–1.0)
Monocytes Relative: 8.5 % (ref 3.0–12.0)
Neutro Abs: 2.3 K/uL (ref 1.4–7.7)
Neutrophils Relative %: 53.6 % (ref 43.0–77.0)
Platelets: 242 K/uL (ref 150.0–400.0)
RBC: 4.62 Mil/uL (ref 3.87–5.11)
RDW: 13.1 % (ref 11.5–14.6)
WBC: 4.3 K/uL — ABNORMAL LOW (ref 4.5–10.5)

## 2011-12-24 LAB — URINALYSIS
Bilirubin Urine: NEGATIVE
Hgb urine dipstick: NEGATIVE
Ketones, ur: NEGATIVE
Leukocytes, UA: NEGATIVE
Nitrite: NEGATIVE
Specific Gravity, Urine: <=1.005
Total Protein, Urine: NEGATIVE
Urine Glucose: NEGATIVE
Urobilinogen, UA: 0.2
pH: 6 (ref 5.0–8.0)

## 2011-12-24 LAB — COMPREHENSIVE METABOLIC PANEL WITH GFR
ALT: 20 U/L (ref 0–35)
AST: 18 U/L (ref 0–37)
Albumin: 3.9 g/dL (ref 3.5–5.2)
Alkaline Phosphatase: 60 U/L (ref 39–117)
BUN: 17 mg/dL (ref 6–23)
CO2: 26 meq/L (ref 19–32)
Calcium: 9.2 mg/dL (ref 8.4–10.5)
Chloride: 105 meq/L (ref 96–112)
Creatinine, Ser: 0.6 mg/dL (ref 0.4–1.2)
GFR: 100.52 mL/min
Glucose, Bld: 93 mg/dL (ref 70–99)
Potassium: 4.2 meq/L (ref 3.5–5.1)
Sodium: 139 meq/L (ref 135–145)
Total Bilirubin: 0.2 mg/dL — ABNORMAL LOW (ref 0.3–1.2)
Total Protein: 6.8 g/dL (ref 6.0–8.3)

## 2011-12-24 LAB — LIPID PANEL
Cholesterol: 254 mg/dL — ABNORMAL HIGH (ref 0–200)
HDL: 57.3 mg/dL
Total CHOL/HDL Ratio: 4
Triglycerides: 182 mg/dL — ABNORMAL HIGH (ref 0.0–149.0)
VLDL: 36.4 mg/dL (ref 0.0–40.0)

## 2011-12-24 LAB — LDL CHOLESTEROL, DIRECT: Direct LDL: 168.7 mg/dL

## 2011-12-24 LAB — VITAMIN B12: Vitamin B-12: 587 pg/mL (ref 211–911)

## 2011-12-25 LAB — VITAMIN D 25 HYDROXY (VIT D DEFICIENCY, FRACTURES): Vit D, 25-Hydroxy: 49 ng/mL (ref 30–89)

## 2011-12-27 LAB — HEMOGLOBIN A1C: Hgb A1c MFr Bld: 6 % (ref 4.6–6.5)

## 2011-12-28 ENCOUNTER — Ambulatory Visit (INDEPENDENT_AMBULATORY_CARE_PROVIDER_SITE_OTHER): Payer: 59 | Admitting: Internal Medicine

## 2011-12-28 ENCOUNTER — Encounter: Payer: Self-pay | Admitting: Internal Medicine

## 2011-12-28 VITALS — BP 130/72 | HR 88 | Temp 98.2°F | Resp 16 | Wt 130.0 lb

## 2011-12-28 DIAGNOSIS — R7309 Other abnormal glucose: Secondary | ICD-10-CM

## 2011-12-28 DIAGNOSIS — I10 Essential (primary) hypertension: Secondary | ICD-10-CM

## 2011-12-28 DIAGNOSIS — E785 Hyperlipidemia, unspecified: Secondary | ICD-10-CM

## 2011-12-28 DIAGNOSIS — F4321 Adjustment disorder with depressed mood: Secondary | ICD-10-CM

## 2011-12-28 MED ORDER — LISINOPRIL-HYDROCHLOROTHIAZIDE 10-12.5 MG PO TABS: 1.0000 | ORAL_TABLET | Freq: Every day | ORAL | Status: DC

## 2011-12-28 NOTE — Assessment & Plan Note (Signed)
Continue with current prescription therapy as reflected on the Med list.  

## 2011-12-28 NOTE — Progress Notes (Signed)
Patient ID: Lisa [name], female   DOB: 01-Aug-1946, 66 y.o.   MRN: [id number]  Subjective:    Patient ID: Lisa [name], female    DOB: 06-09-1946, 66 y.o.   MRN: [id number]  HPI  The patient presents for a follow-up of  chronic hypertension, chronic dyslipidemia and elev glu controlled with diet some   Review of Systems  Constitutional: Negative for appetite change.  HENT: Negative for sneezing.   Respiratory: Negative for cough and choking.   Gastrointestinal: Negative for anal bleeding.  Genitourinary: Negative for decreased urine volume.  Musculoskeletal: Negative for back pain.  Skin: Negative for color change.  Neurological: Negative for numbness.  Psychiatric/Behavioral: Negative for dysphoric mood.       Wt Readings from Last 3 Encounters:  12/28/11 130 lb (58.968 kg)  11/20/11 129 lb 12 oz (58.854 kg)  02/09/11 133 lb (60.328 kg)   BP Readings from Last 3 Encounters:  12/28/11 130/72  11/20/11 136/88  09/19/11 136/72     Objective:   Physical Exam  Constitutional: She appears well-developed and well-nourished. No distress.  HENT:  Head: Normocephalic.  Right Ear: External ear normal.  Left Ear: External ear normal.  Nose: Nose normal.  Mouth/Throat: Oropharynx is clear and moist.  Eyes: Conjunctivae are normal. Pupils are equal, round, and reactive to light. Right eye exhibits no discharge. Left eye exhibits no discharge.  Neck: Normal range of motion. Neck supple. No JVD present. No tracheal deviation present. No thyromegaly present.  Cardiovascular: Normal rate, regular rhythm and normal heart sounds.   Pulmonary/Chest: No stridor. No respiratory distress. She has no wheezes.  Abdominal: Soft. Bowel sounds are normal. She exhibits no distension and no mass. There is no tenderness. There is no rebound and no guarding.  Musculoskeletal: She exhibits no edema and no tenderness.  Lymphadenopathy:    She has no cervical adenopathy.  Neurological: She displays  normal reflexes. No cranial nerve deficit. She exhibits normal muscle tone. Coordination normal.  Skin: No rash noted. No erythema.  Psychiatric: She has a normal mood and affect. Her behavior is normal. Judgment and thought content normal.        Lab Results  Component Value Date   WBC 4.3* 12/24/2011   HGB 13.6 12/24/2011   HCT 40.8 12/24/2011   PLT 242.0 12/24/2011   CHOL 254* 12/24/2011   TRIG 182.0* 12/24/2011   HDL 57.30 12/24/2011   LDLDIRECT 168.7 12/24/2011   ALT 20 12/24/2011   AST 18 12/24/2011   NA 139 12/24/2011   K 4.2 12/24/2011   CL 105 12/24/2011   CREATININE 0.6 12/24/2011   BUN 17 12/24/2011   CO2 26 12/24/2011   TSH 0.79 12/24/2011   HGBA1C 6.0 12/24/2011      Assessment & Plan:

## 2011-12-28 NOTE — Assessment & Plan Note (Signed)
Discussed.

## 2011-12-28 NOTE — Assessment & Plan Note (Signed)
Flax oil

## 2012-01-11 ENCOUNTER — Telehealth: Payer: Self-pay | Admitting: *Deleted

## 2012-01-11 DIAGNOSIS — Z0000 Encounter for general adult medical examination without abnormal findings: Secondary | ICD-10-CM

## 2012-01-11 NOTE — Telephone Encounter (Signed)
06/2012 CPE labs entered.  

## 2012-02-28 DIAGNOSIS — H35349 Macular cyst, hole, or pseudohole, unspecified eye: Secondary | ICD-10-CM | POA: Insufficient documentation

## 2012-02-28 DIAGNOSIS — Z961 Presence of intraocular lens: Secondary | ICD-10-CM | POA: Insufficient documentation

## 2012-03-19 ENCOUNTER — Ambulatory Visit (INDEPENDENT_AMBULATORY_CARE_PROVIDER_SITE_OTHER): Payer: 59 | Admitting: Internal Medicine

## 2012-03-19 ENCOUNTER — Encounter: Payer: Self-pay | Admitting: Internal Medicine

## 2012-03-19 VITALS — BP 140/72 | HR 76 | Temp 98.2°F | Resp 16 | Wt 135.0 lb

## 2012-03-19 DIAGNOSIS — M79609 Pain in unspecified limb: Secondary | ICD-10-CM

## 2012-03-19 DIAGNOSIS — M79605 Pain in left leg: Secondary | ICD-10-CM | POA: Insufficient documentation

## 2012-03-19 MED ORDER — PREDNISONE 10 MG PO TABS: 10.0000 mg | ORAL_TABLET | Freq: Every day | ORAL | Status: DC

## 2012-03-19 MED ORDER — TRAMADOL HCL 50 MG PO TABS: 50.0000 mg | ORAL_TABLET | Freq: Two times a day (BID) | ORAL | Status: AC | PRN

## 2012-03-19 NOTE — Assessment & Plan Note (Signed)
Prednisone 10 mg: take 4 tabs a day x 3 days; then 3 tabs a day x 4 days; then 2 tabs a day x 4 days, then 1 tab a day x 6 days, then stop. Take pc. Tramadol ROM exercises Xray if not better

## 2012-03-19 NOTE — Patient Instructions (Addendum)
ROM exersises

## 2012-03-19 NOTE — Progress Notes (Signed)
   Subjective:    Patient ID: Lisa [name], female    DOB: 12-22-1945, 66 y.o.   MRN: [id number]  Hip Pain  The incident occurred more than 1 week ago. The incident occurred at work. Injury mechanism: lifted a 5 gal bottle at work. The pain is present in the left leg, left hip and left thigh. The quality of the pain is described as aching. The pain is at a severity of 7/10. The pain is severe. The pain has been constant since onset. Pertinent negatives include no loss of sensation, muscle weakness, numbness or tingling. The symptoms are aggravated by movement. She has tried NSAIDs for the symptoms. The treatment provided mild relief.    The patient presents for a follow-up of  chronic hypertension, chronic dyslipidemia and elev glu controlled with diet some   Review of Systems  Constitutional: Negative for appetite change.  HENT: Negative for sneezing.   Respiratory: Negative for cough and choking.   Gastrointestinal: Negative for anal bleeding.  Genitourinary: Negative for decreased urine volume.  Musculoskeletal: Negative for back pain.  Skin: Negative for color change.  Neurological: Negative for tingling and numbness.  Psychiatric/Behavioral: Negative for dysphoric mood.       Wt Readings from Last 3 Encounters:  03/19/12 135 lb (61.236 kg)  12/28/11 130 lb (58.968 kg)  11/20/11 129 lb 12 oz (58.854 kg)   BP Readings from Last 3 Encounters:  03/19/12 140/72  12/28/11 130/72  11/20/11 136/88     Objective:   Physical Exam  Constitutional: She appears well-developed and well-nourished. No distress.  HENT:  Head: Normocephalic.  Right Ear: External ear normal.  Left Ear: External ear normal.  Nose: Nose normal.  Mouth/Throat: Oropharynx is clear and moist.  Eyes: Conjunctivae are normal. Pupils are equal, round, and reactive to light. Right eye exhibits no discharge. Left eye exhibits no discharge.  Neck: Normal range of motion. Neck supple. No JVD present. No tracheal  deviation present. No thyromegaly present.  Cardiovascular: Normal rate, regular rhythm and normal heart sounds.   Pulmonary/Chest: No stridor. No respiratory distress. She has no wheezes.  Abdominal: Soft. Bowel sounds are normal. She exhibits no distension and no mass. There is no tenderness. There is no rebound and no guarding.  Musculoskeletal: She exhibits no edema and no tenderness.  Lymphadenopathy:    She has no cervical adenopathy.  Neurological: She displays normal reflexes. No cranial nerve deficit. She exhibits normal muscle tone. Coordination normal.  Skin: No rash noted. No erythema.  Psychiatric: She has a normal mood and affect. Her behavior is normal. Judgment and thought content normal.  LS is tender w/ROM Str leg is +/- B      Lab Results  Component Value Date   WBC 4.3* 12/24/2011   HGB 13.6 12/24/2011   HCT 40.8 12/24/2011   PLT 242.0 12/24/2011   CHOL 254* 12/24/2011   TRIG 182.0* 12/24/2011   HDL 57.30 12/24/2011   LDLDIRECT 168.7 12/24/2011   ALT 20 12/24/2011   AST 18 12/24/2011   NA 139 12/24/2011   K 4.2 12/24/2011   CL 105 12/24/2011   CREATININE 0.6 12/24/2011   BUN 17 12/24/2011   CO2 26 12/24/2011   TSH 0.79 12/24/2011   HGBA1C 6.0 12/24/2011      Assessment & Plan:

## 2012-03-26 ENCOUNTER — Encounter: Payer: Self-pay | Admitting: Internal Medicine

## 2012-04-08 ENCOUNTER — Ambulatory Visit (INDEPENDENT_AMBULATORY_CARE_PROVIDER_SITE_OTHER): Payer: 59 | Admitting: Internal Medicine

## 2012-04-08 ENCOUNTER — Encounter: Payer: Self-pay | Admitting: Internal Medicine

## 2012-04-08 VITALS — BP 140/80 | HR 65 | Temp 98.4°F | Ht 61.5 in | Wt 135.1 lb

## 2012-04-08 DIAGNOSIS — R7309 Other abnormal glucose: Secondary | ICD-10-CM

## 2012-04-08 DIAGNOSIS — M5416 Radiculopathy, lumbar region: Secondary | ICD-10-CM | POA: Insufficient documentation

## 2012-04-08 DIAGNOSIS — IMO0002 Reserved for concepts with insufficient information to code with codable children: Secondary | ICD-10-CM

## 2012-04-08 DIAGNOSIS — I10 Essential (primary) hypertension: Secondary | ICD-10-CM

## 2012-04-08 MED ORDER — PREDNISONE 10 MG PO TABS: 10.0000 mg | ORAL_TABLET | Freq: Every day | ORAL | Status: DC

## 2012-04-08 NOTE — Assessment & Plan Note (Signed)
stable overall by hx and exam, most recent data reviewed with pt, and pt to continue medical treatment as before BP Readings from Last 3 Encounters:  04/08/12 140/80  03/19/12 140/72  12/28/11 130/72

## 2012-04-08 NOTE — Assessment & Plan Note (Signed)
stable overall by hx and exam, most recent data reviewed with pt, and pt to continue medical treatment as before Lab Results  Component Value Date   HGBA1C 6.0 12/24/2011   To call for onset polys or cbg> 200 on predpack

## 2012-04-08 NOTE — Assessment & Plan Note (Signed)
New onset, for predpack asd, cont ibuprofen prn per pt preference, MRI LS spine and NS referral,  to f/u any worsening symptoms or concerns

## 2012-04-08 NOTE — Progress Notes (Addendum)
Subjective:    Patient ID: Lisa [name], female    DOB: 10-15-1945, 66 y.o.   MRN: [id number]  HPI  Here after lifting a large object with pain to the back left > right with small pain to the left hip and prox left leg, tx with prednisone, ibuprofen, but unfortunately in the last 2-3 days pain is worse toothache like, ? Related to elevator out at work so walked down and fell down the last 6 steps 8 days ago, had mult bruising, went back to ibuprofen since then up to 600 mg qid prn, but often pain wakes her up at 4 am; pain now 6/10 - 7/10, better with heat and rest , but still with radiating pain now occas to left ankle, with intermittent numbness of left great toe, and ? Distal weakness, but no bowel or bladder change, fever, wt loss, gait change or falls. No prior MRI, no xray, surgury or PT.  Recent PT like excercises seemed to make worse, has been doing all household chores like laundry as well.  Tramadol did not seem to help, felt more comfortable with taking the ibuprofen. Pt denies chest pain, increased sob or doe, wheezing, orthopnea, PND, increased LE swelling, palpitations, dizziness or syncope.   Pt denies polydipsia, polyuria. Past Medical History  Diagnosis Date  . Hyperlipidemia   . Hypertension   . Cataract 2009    right-Dr. Nile Riggs  . Retinal tear 2008    B- Dr. Gregery Na  . Vitamin d deficiency    Past Surgical History  Procedure Date  . Cataract extraction     Dr. Nile Riggs  . Retinal tear repair cryotherapy     Dr. Gregery Na    reports that she has never smoked. She does not have any smokeless tobacco history on file. She reports that she does not drink alcohol or use illicit drugs. family history includes COPD in her father and sister and Hyperlipidemia in her other. Allergies  Allergen Reactions  . Atorvastatin     REACTION: achy  . Ezetimibe-Simvastatin     REACTION: myalgia  . Fish Oil     REACTION: bruising  . Livalo (Pitavastatin Calcium)     pain  . Lovastatin      REACTION: myalgias  . Rosuvastatin     REACTION: myalgia  . Zetia (Ezetimibe)     Side effects   Current Outpatient Prescriptions on File Prior to Visit  Medication Sig Dispense Refill  . aspirin 81 MG tablet Take 81 mg by mouth daily. 2-3/week      . Calcium Carbonate-Vitamin D (CALTRATE 600+D) 600-400 MG-UNIT per tablet Take 2 tablets by mouth daily. 2-3/week      . cholecalciferol (VITAMIN D) 1000 UNITS tablet Take 1,000 Units by mouth 2 (two) times daily.        . Diclofenac Sodium (PENNSAID) 1.5 % SOLN Place 3-5 drops onto the skin 3 (three) times daily.        Marland Kitchen lisinopril-hydrochlorothiazide (PRINZIDE,ZESTORETIC) 10-12.5 MG per tablet Take 1 tablet by mouth daily.  90 tablet  3  . Omega-3 Fatty Acids (FISH OIL) 1000 MG CAPS Take 1 capsule by mouth 2 (two) times daily.        . vitamin B-12 (CYANOCOBALAMIN) 250 MCG tablet Take 250 mcg by mouth daily. 2-3/week      . Flaxseed, Linseed, (FLAX SEED OIL) 1000 MG CAPS Take 1 capsule by mouth daily.        . metFORMIN (GLUCOPHAGE) 500 MG tablet Take 500  mg by mouth 2 (two) times daily with a meal.       Review of Systems Review of Systems  Constitutional: Negative for diaphoresis and unexpected weight change.  HENT: Negative for tinnitus.   Eyes: Negative for photophobia and visual disturbance.  Respiratory: Negative for choking and stridor.   Gastrointestinal: Negative for vomiting and blood in stool.  Genitourinary: Negative for hematuria and decreased urine volume.  Musculoskeletal: Negative for gait problem.  Skin: Negative for color change and wound.  Neurological: Negative for tremors    Objective:   Physical Exam BP 140/80  Pulse 65  Temp 98.4 F (36.9 C) (Oral)  Ht 5' 1.5" (1.562 m)  Wt 135 lb 2 oz (61.292 kg)  BMI 25.12 kg/m2  SpO2 97% Physical Exam  VS noted Constitutional: Pt appears well-developed and well-nourished.  HENT: Head: Normocephalic.  Right Ear: External ear normal.  Left Ear: External ear  normal.  Eyes: Conjunctivae and EOM are normal. Pupils are equal, round, and reactive to light.  Neck: Normal range of motion. Neck supple.  Cardiovascular: Normal rate and regular rhythm.   Pulmonary/Chest: Effort normal and breath sounds normal.  Abd:  Soft, NT, non-distended, + BS Neurological: Pt is alert. Not confused.  Motor/sens/dtr intact except for 4+/5 LLE distal weakness, decreased sens to LT, and diminished patellar and s1 reflex  Skin: Skin is warm. No erythema. No rash Spine nontender; has tender area over left SI joint Psychiatric: Pt behavior is normal. Thought content normal. 1+ nervous    Assessment & Plan:

## 2012-04-08 NOTE — Patient Instructions (Addendum)
You have a left lumbar radiculopathy by examination today Take all new medications as prescribed - the prednisone Continue all other medications as before, but you should take prilosec 20 mg per day with the ibuprofen You will be contacted regarding the referral for: MRI, and Neurosurgury

## 2012-04-08 NOTE — Progress Notes (Deleted)
  Subjective:    Patient ID: Lisa [name], female    DOB: 19-Feb-1946, 67 y.o.   MRN: [id number]  HPI    Review of Systems     Objective:   Physical Exam        Assessment & Plan:

## 2012-04-11 ENCOUNTER — Ambulatory Visit (HOSPITAL_COMMUNITY)
Admission: RE | Admit: 2012-04-11 | Discharge: 2012-04-11 | Disposition: A | Discharge status: Home / Self Care | Payer: 59 | Source: Ambulatory Visit | Attending: Internal Medicine | Admitting: Internal Medicine

## 2012-04-11 ENCOUNTER — Other Ambulatory Visit: Payer: Self-pay | Admitting: *Deleted

## 2012-04-11 ENCOUNTER — Encounter: Payer: Self-pay | Admitting: Internal Medicine

## 2012-04-11 DIAGNOSIS — M5416 Radiculopathy, lumbar region: Secondary | ICD-10-CM

## 2012-04-11 DIAGNOSIS — M5124 Other intervertebral disc displacement, thoracic region: Secondary | ICD-10-CM | POA: Insufficient documentation

## 2012-04-11 DIAGNOSIS — M47817 Spondylosis without myelopathy or radiculopathy, lumbosacral region: Secondary | ICD-10-CM | POA: Insufficient documentation

## 2012-04-11 DIAGNOSIS — M8448XA Pathological fracture, other site, initial encounter for fracture: Secondary | ICD-10-CM | POA: Insufficient documentation

## 2012-04-11 DIAGNOSIS — M545 Low back pain, unspecified: Secondary | ICD-10-CM | POA: Insufficient documentation

## 2012-04-11 DIAGNOSIS — R29898 Other symptoms and signs involving the musculoskeletal system: Secondary | ICD-10-CM | POA: Insufficient documentation

## 2012-04-11 DIAGNOSIS — Q7649 Other congenital malformations of spine, not associated with scoliosis: Secondary | ICD-10-CM | POA: Insufficient documentation

## 2012-04-11 MED ORDER — LISINOPRIL-HYDROCHLOROTHIAZIDE 10-12.5 MG PO TABS: 1.0000 | ORAL_TABLET | Freq: Every day | ORAL | Status: DC

## 2012-05-16 ENCOUNTER — Other Ambulatory Visit: Payer: Self-pay | Admitting: Internal Medicine

## 2012-07-09 ENCOUNTER — Other Ambulatory Visit (INDEPENDENT_AMBULATORY_CARE_PROVIDER_SITE_OTHER): Payer: 59

## 2012-07-09 DIAGNOSIS — Z0000 Encounter for general adult medical examination without abnormal findings: Secondary | ICD-10-CM

## 2012-07-09 LAB — HEPATIC FUNCTION PANEL
ALT: 24 U/L (ref 0–35)
AST: 19 U/L (ref 0–37)
Albumin: 3.5 g/dL (ref 3.5–5.2)

## 2012-07-09 LAB — URINALYSIS, ROUTINE W REFLEX MICROSCOPIC
Bilirubin Urine: NEGATIVE
Hgb urine dipstick: NEGATIVE
Leukocytes, UA: NEGATIVE
Nitrite: NEGATIVE
pH: 7.5 (ref 5.0–8.0)

## 2012-07-09 LAB — BASIC METABOLIC PANEL
BUN: 15 mg/dL (ref 6–23)
GFR: 86.02 mL/min (ref >60.00)
Glucose, Bld: 98 mg/dL (ref 70–99)
Potassium: 4.3 mEq/L (ref 3.5–5.1)

## 2012-07-09 LAB — LIPID PANEL
Cholesterol: 284 mg/dL — ABNORMAL HIGH (ref 0–200)
HDL: 49.1 mg/dL (ref >39.00)

## 2012-07-09 LAB — CBC WITH DIFFERENTIAL/PLATELET
Basophils Absolute: 0 10*3/uL (ref 0.0–0.1)
Eosinophils Absolute: 0.1 10*3/uL (ref 0.0–0.7)
Hemoglobin: 14.3 g/dL (ref 12.0–15.0)
Lymphocytes Relative: 34.8 % (ref 12.0–46.0)
MCHC: 33.4 g/dL (ref 30.0–36.0)
Neutro Abs: 2.7 10*3/uL (ref 1.4–7.7)
Neutrophils Relative %: 55.1 % (ref 43.0–77.0)
Platelets: 267 10*3/uL (ref 150.0–400.0)
RDW: 12.7 % (ref 11.5–14.6)

## 2012-07-11 ENCOUNTER — Encounter: Payer: Self-pay | Admitting: Internal Medicine

## 2012-07-11 ENCOUNTER — Ambulatory Visit (INDEPENDENT_AMBULATORY_CARE_PROVIDER_SITE_OTHER): Payer: 59 | Admitting: Internal Medicine

## 2012-07-11 VITALS — BP 140/90 | HR 84 | Temp 97.0°F | Resp 16 | Ht 61.5 in | Wt 135.0 lb

## 2012-07-11 DIAGNOSIS — I10 Essential (primary) hypertension: Secondary | ICD-10-CM

## 2012-07-11 DIAGNOSIS — Z23 Encounter for immunization: Secondary | ICD-10-CM

## 2012-07-11 DIAGNOSIS — E785 Hyperlipidemia, unspecified: Secondary | ICD-10-CM

## 2012-07-11 DIAGNOSIS — Z0000 Encounter for general adult medical examination without abnormal findings: Secondary | ICD-10-CM

## 2012-07-11 NOTE — Progress Notes (Signed)
   Subjective:    Patient ID: Lisa [name], female    DOB: October 12, 1945, 66 y.o.   MRN: [id number]  HPI The patient is here for a wellness exam. The patient has been doing well overall without major physical or psychological issues going on lately.  The patient presents for a follow-up of  chronic hypertension, chronic dyslipidemia and elev glu controlled with diet some   Review of Systems  Constitutional: Negative for appetite change.  HENT: Negative for sneezing.   Respiratory: Negative for cough and choking.   Gastrointestinal: Negative for anal bleeding.  Genitourinary: Negative for decreased urine volume.  Musculoskeletal: Negative for back pain.  Skin: Negative for color change.  Psychiatric/Behavioral: Negative for dysphoric mood.       Wt Readings from Last 3 Encounters:  07/11/12 135 lb (61.236 kg)  04/08/12 135 lb 2 oz (61.292 kg)  03/19/12 135 lb (61.236 kg)   BP Readings from Last 3 Encounters:  07/11/12 140/90  04/08/12 140/80  03/19/12 140/72     Objective:   Physical Exam  Constitutional: She appears well-developed and well-nourished. No distress.  HENT:  Head: Normocephalic.  Right Ear: External ear normal.  Left Ear: External ear normal.  Nose: Nose normal.  Mouth/Throat: Oropharynx is clear and moist.  Eyes: Conjunctivae normal are normal. Pupils are equal, round, and reactive to light. Right eye exhibits no discharge. Left eye exhibits no discharge.  Neck: Normal range of motion. Neck supple. No JVD present. No tracheal deviation present. No thyromegaly present.  Cardiovascular: Normal rate, regular rhythm and normal heart sounds.   Pulmonary/Chest: No stridor. No respiratory distress. She has no wheezes.  Abdominal: Soft. Bowel sounds are normal. She exhibits no distension and no mass. There is no tenderness. There is no rebound and no guarding.  Musculoskeletal: She exhibits no edema and no tenderness.  Lymphadenopathy:    She has no cervical  adenopathy.  Neurological: She displays normal reflexes. No cranial nerve deficit. She exhibits normal muscle tone. Coordination normal.  Skin: No rash noted. No erythema.  Psychiatric: She has a normal mood and affect. Her behavior is normal. Judgment and thought content normal.  LS is tender w/ROM Str leg is (-) B      Lab Results  Component Value Date   WBC 4.9 07/09/2012   HGB 14.3 07/09/2012   HCT 42.7 07/09/2012   PLT 267.0 07/09/2012   CHOL 284* 07/09/2012   TRIG 146.0 07/09/2012   HDL 49.10 07/09/2012   LDLDIRECT 211.6 07/09/2012   ALT 24 07/09/2012   AST 19 07/09/2012   NA 138 07/09/2012   K 4.3 07/09/2012   CL 104 07/09/2012   CREATININE 0.7 07/09/2012   BUN 15 07/09/2012   CO2 29 07/09/2012   TSH 0.79 12/24/2011   HGBA1C 6.0 12/24/2011      Assessment & Plan:

## 2012-07-13 ENCOUNTER — Encounter: Payer: Self-pay | Admitting: Internal Medicine

## 2012-07-13 DIAGNOSIS — Z0000 Encounter for general adult medical examination without abnormal findings: Secondary | ICD-10-CM | POA: Insufficient documentation

## 2012-07-13 NOTE — Assessment & Plan Note (Signed)
Continue with current prescription therapy as reflected on the Med list.  

## 2012-07-13 NOTE — Assessment & Plan Note (Signed)
Worse. Improve diet.

## 2012-07-13 NOTE — Assessment & Plan Note (Signed)
We discussed age appropriate health related issues, including available/recomended screening tests and vaccinations. We discussed a need for adhering to healthy diet and exercise. Labs/EKG were reviewed/ordered. All questions were answered.   

## 2013-01-08 ENCOUNTER — Other Ambulatory Visit: Payer: Self-pay | Admitting: Internal Medicine

## 2013-01-09 ENCOUNTER — Ambulatory Visit (INDEPENDENT_AMBULATORY_CARE_PROVIDER_SITE_OTHER): Payer: 59 | Admitting: Internal Medicine

## 2013-01-09 ENCOUNTER — Encounter: Payer: Self-pay | Admitting: Internal Medicine

## 2013-01-09 VITALS — BP 140/82 | HR 76 | Temp 97.9°F | Resp 16 | Wt 132.0 lb

## 2013-01-09 DIAGNOSIS — I10 Essential (primary) hypertension: Secondary | ICD-10-CM

## 2013-01-09 DIAGNOSIS — E785 Hyperlipidemia, unspecified: Secondary | ICD-10-CM

## 2013-01-09 NOTE — Progress Notes (Signed)
   Subjective:    HPI  The patient has been doing well overall without major physical or psychological issues going on lately.  The patient presents for a follow-up of  chronic hypertension, chronic dyslipidemia and elev glu controlled with diet some   Review of Systems  Constitutional: Negative for appetite change.  HENT: Negative for sneezing.   Respiratory: Negative for cough and choking.   Gastrointestinal: Negative for anal bleeding.  Genitourinary: Negative for decreased urine volume.  Musculoskeletal: Negative for back pain.  Skin: Negative for color change.  Psychiatric/Behavioral: Negative for dysphoric mood.       Wt Readings from Last 3 Encounters:  01/09/13 132 lb (59.875 kg)  07/11/12 135 lb (61.236 kg)  04/08/12 135 lb 2 oz (61.292 kg)   BP Readings from Last 3 Encounters:  01/09/13 140/82  07/11/12 140/90  04/08/12 140/80     Objective:   Physical Exam  Constitutional: She appears well-developed and well-nourished. No distress.  HENT:  Head: Normocephalic.  Right Ear: External ear normal.  Left Ear: External ear normal.  Nose: Nose normal.  Mouth/Throat: Oropharynx is clear and moist.  Eyes: Conjunctivae are normal. Pupils are equal, round, and reactive to light. Right eye exhibits no discharge. Left eye exhibits no discharge.  Neck: Normal range of motion. Neck supple. No JVD present. No tracheal deviation present. No thyromegaly present.  Cardiovascular: Normal rate, regular rhythm and normal heart sounds.   Pulmonary/Chest: No stridor. No respiratory distress. She has no wheezes.  Abdominal: Soft. Bowel sounds are normal. She exhibits no distension and no mass. There is no tenderness. There is no rebound and no guarding.  Musculoskeletal: She exhibits no edema and no tenderness.  Lymphadenopathy:    She has no cervical adenopathy.  Neurological: She displays normal reflexes. No cranial nerve deficit. She exhibits normal muscle tone. Coordination  normal.  Skin: No rash noted. No erythema.  Psychiatric: She has a normal mood and affect. Her behavior is normal. Judgment and thought content normal.       Lab Results  Component Value Date   WBC 4.9 07/09/2012   HGB 14.3 07/09/2012   HCT 42.7 07/09/2012   PLT 267.0 07/09/2012   CHOL 284* 07/09/2012   TRIG 146.0 07/09/2012   HDL 49.10 07/09/2012   LDLDIRECT 211.6 07/09/2012   ALT 24 07/09/2012   AST 19 07/09/2012   NA 138 07/09/2012   K 4.3 07/09/2012   CL 104 07/09/2012   CREATININE 0.7 07/09/2012   BUN 15 07/09/2012   CO2 29 07/09/2012   TSH 0.62 07/09/2012   HGBA1C 6.0 12/24/2011      Assessment & Plan:

## 2013-01-09 NOTE — Assessment & Plan Note (Signed)
Continue with current prescription therapy as reflected on the Med list.  

## 2013-01-09 NOTE — Assessment & Plan Note (Signed)
Chronic refractory

## 2013-01-14 ENCOUNTER — Telehealth: Payer: Self-pay | Admitting: *Deleted

## 2013-01-14 DIAGNOSIS — Z0000 Encounter for general adult medical examination without abnormal findings: Secondary | ICD-10-CM

## 2013-01-14 NOTE — Telephone Encounter (Signed)
Labs entered.

## 2013-01-14 NOTE — Telephone Encounter (Signed)
Message copied by Merrilyn Puma on Wed Jan 14, 2013  3:02 PM ------      Message from: Etheleen Sia      Created: Fri Jan 09, 2013  2:00 PM      Regarding: LAB       PHYSICAL LABS IN OCT  ------

## 2013-02-19 DIAGNOSIS — E119 Type 2 diabetes mellitus without complications: Secondary | ICD-10-CM | POA: Insufficient documentation

## 2013-07-14 ENCOUNTER — Encounter: Payer: 59 | Admitting: Internal Medicine

## 2013-09-01 ENCOUNTER — Encounter: Payer: 59 | Admitting: Internal Medicine

## 2013-10-14 ENCOUNTER — Telehealth: Payer: Self-pay | Admitting: *Deleted

## 2013-10-14 NOTE — Telephone Encounter (Signed)
What should I say in it? Thx

## 2013-10-14 NOTE — Telephone Encounter (Signed)
Pt left vm requesting a signed statement. Pt is being screened for employment at [employer]. [employer] needs patient's lift restrictions. Pt requested that you send a signed statement stating what her lift limits are, due to her back problems. Fax statement to Employee Health at [employer].  [phone]

## 2013-10-15 ENCOUNTER — Encounter: Payer: Self-pay | Admitting: Internal Medicine

## 2013-10-15 NOTE — Telephone Encounter (Signed)
Patient stated that letter most say what the heaviest amount that she can lift is.

## 2013-10-15 NOTE — Telephone Encounter (Signed)
Done. Pls fax Thx

## 2013-10-16 NOTE — Telephone Encounter (Signed)
Letter printed and faxed

## 2013-10-27 ENCOUNTER — Other Ambulatory Visit: Payer: Self-pay | Admitting: *Deleted

## 2013-10-27 ENCOUNTER — Encounter: Payer: Self-pay | Admitting: Internal Medicine

## 2013-10-27 ENCOUNTER — Other Ambulatory Visit (INDEPENDENT_AMBULATORY_CARE_PROVIDER_SITE_OTHER): Payer: Medicare Other

## 2013-10-27 ENCOUNTER — Ambulatory Visit (INDEPENDENT_AMBULATORY_CARE_PROVIDER_SITE_OTHER): Payer: Medicare Other | Admitting: Internal Medicine

## 2013-10-27 VITALS — BP 140/86 | HR 76 | Resp 16 | Ht 61.0 in | Wt 130.0 lb

## 2013-10-27 DIAGNOSIS — Z0000 Encounter for general adult medical examination without abnormal findings: Secondary | ICD-10-CM

## 2013-10-27 DIAGNOSIS — R739 Hyperglycemia, unspecified: Secondary | ICD-10-CM

## 2013-10-27 DIAGNOSIS — R7309 Other abnormal glucose: Secondary | ICD-10-CM | POA: Diagnosis not present

## 2013-10-27 LAB — HEPATIC FUNCTION PANEL
ALBUMIN: 4 g/dL (ref 3.5–5.2)
ALT: 27 U/L (ref 0–35)
AST: 21 U/L (ref 0–37)
Alkaline Phosphatase: 60 U/L (ref 39–117)
Bilirubin, Direct: 0 mg/dL (ref 0.0–0.3)
TOTAL PROTEIN: 7.3 g/dL (ref 6.0–8.3)
Total Bilirubin: 0.5 mg/dL (ref 0.3–1.2)

## 2013-10-27 LAB — BASIC METABOLIC PANEL
BUN: 12 mg/dL (ref 6–23)
CALCIUM: 9.5 mg/dL (ref 8.4–10.5)
CHLORIDE: 107 meq/L (ref 96–112)
CO2: 27 meq/L (ref 19–32)
CREATININE: 0.6 mg/dL (ref 0.4–1.2)
GFR: 103.75 mL/min (ref >60.00)
GLUCOSE: 99 mg/dL (ref 70–99)
Potassium: 4.3 mEq/L (ref 3.5–5.1)
Sodium: 141 mEq/L (ref 135–145)

## 2013-10-27 LAB — HEMOGLOBIN A1C: HEMOGLOBIN A1C: 5.9 % (ref 4.6–6.5)

## 2013-10-27 MED ORDER — LISINOPRIL-HYDROCHLOROTHIAZIDE 10-12.5 MG PO TABS: 1.0000 | ORAL_TABLET | Freq: Every day | ORAL | Status: DC

## 2013-10-27 NOTE — Progress Notes (Signed)
   Subjective:    HPI  The patient is here for a wellness exam. The patient has been doing well overall without major physical or psychological issues going on lately.   The patient has been doing well overall without major physical or psychological issues going on lately.  The patient presents for a follow-up of  chronic hypertension, chronic dyslipidemia and elev glu controlled with diet some   Review of Systems  Constitutional: Negative for appetite change.  HENT: Negative for sneezing.   Respiratory: Negative for cough and choking.   Gastrointestinal: Negative for anal bleeding.  Genitourinary: Negative for decreased urine volume.  Musculoskeletal: Negative for back pain.  Skin: Negative for color change.  Psychiatric/Behavioral: Negative for dysphoric mood.       Wt Readings from Last 3 Encounters:  10/27/13 130 lb (58.968 kg)  01/09/13 132 lb (59.875 kg)  07/11/12 135 lb (61.236 kg)   BP Readings from Last 3 Encounters:  10/27/13 140/86  01/09/13 140/82  07/11/12 140/90     Objective:   Physical Exam  Constitutional: She appears well-developed and well-nourished. No distress.  HENT:  Head: Normocephalic.  Right Ear: External ear normal.  Left Ear: External ear normal.  Nose: Nose normal.  Mouth/Throat: Oropharynx is clear and moist.  Eyes: Conjunctivae are normal. Pupils are equal, round, and reactive to light. Right eye exhibits no discharge. Left eye exhibits no discharge.  Neck: Normal range of motion. Neck supple. No JVD present. No tracheal deviation present. No thyromegaly present.  Cardiovascular: Normal rate, regular rhythm and normal heart sounds.   Pulmonary/Chest: No stridor. No respiratory distress. She has no wheezes.  Abdominal: Soft. Bowel sounds are normal. She exhibits no distension and no mass. There is no tenderness. There is no rebound and no guarding.  Musculoskeletal: She exhibits no edema and no tenderness.  Lymphadenopathy:    She has  no cervical adenopathy.  Neurological: She displays normal reflexes. No cranial nerve deficit. She exhibits normal muscle tone. Coordination normal.  Skin: No rash noted. No erythema.  Psychiatric: She has a normal mood and affect. Her behavior is normal. Judgment and thought content normal.       Lab Results  Component Value Date   WBC 4.9 07/09/2012   HGB 14.3 07/09/2012   HCT 42.7 07/09/2012   PLT 267.0 07/09/2012   CHOL 284* 07/09/2012   TRIG 146.0 07/09/2012   HDL 49.10 07/09/2012   LDLDIRECT 211.6 07/09/2012   ALT 24 07/09/2012   AST 19 07/09/2012   NA 138 07/09/2012   K 4.3 07/09/2012   CL 104 07/09/2012   CREATININE 0.7 07/09/2012   BUN 15 07/09/2012   CO2 29 07/09/2012   TSH 0.62 07/09/2012   HGBA1C 6.0 12/24/2011      Assessment & Plan:

## 2013-10-27 NOTE — Progress Notes (Signed)
Pre visit review using our clinic review tool, if applicable. No additional management support is needed unless otherwise documented below in the visit note.,  

## 2013-10-27 NOTE — Assessment & Plan Note (Signed)
Here for medicare wellness/physical  Diet: heart healthy  Physical activity: not sedentary  Depression/mood screen: negative  Hearing: intact to whispered voice  Visual acuity: grossly normal, performs annual eye exam  ADLs: capable  Fall risk: none  Home safety: good  Cognitive evaluation: intact to orientation, naming, recall and repetition  EOL planning: adv directives, full code/ I agree  I have personally reviewed and have noted  1. The patient's medical and social history  2. Their use of alcohol, tobacco or illicit drugs  3. Their current medications and supplements  4. The patient's functional ability including ADL's, fall risks, home safety risks and hearing or visual impairment.  5. Diet and physical activities  6. Evidence for depression or mood disorders    Today patient counseled on age appropriate routine health concerns for screening and prevention, each reviewed and up to date or declined. Immunizations reviewed and up to date or declined. Labs ordered and reviewed. Risk factors for depression reviewed and negative. Hearing function and visual acuity are intact. ADLs screened and addressed as needed. Functional ability and level of safety reviewed and appropriate. Education, counseling and referrals performed based on assessed risks today. Patient provided with a copy of personalized plan for preventive services.    Working part time

## 2014-02-09 DIAGNOSIS — S99919A Unspecified injury of unspecified ankle, initial encounter: Secondary | ICD-10-CM | POA: Diagnosis not present

## 2014-02-09 DIAGNOSIS — S8990XA Unspecified injury of unspecified lower leg, initial encounter: Secondary | ICD-10-CM | POA: Diagnosis not present

## 2014-02-09 DIAGNOSIS — S61409A Unspecified open wound of unspecified hand, initial encounter: Secondary | ICD-10-CM | POA: Diagnosis not present

## 2014-02-09 DIAGNOSIS — W268XXA Contact with other sharp object(s), not elsewhere classified, initial encounter: Secondary | ICD-10-CM | POA: Diagnosis not present

## 2014-02-09 DIAGNOSIS — W1809XA Striking against other object with subsequent fall, initial encounter: Secondary | ICD-10-CM | POA: Diagnosis not present

## 2014-02-09 DIAGNOSIS — I10 Essential (primary) hypertension: Secondary | ICD-10-CM | POA: Diagnosis not present

## 2014-02-09 DIAGNOSIS — S93409A Sprain of unspecified ligament of unspecified ankle, initial encounter: Secondary | ICD-10-CM | POA: Diagnosis not present

## 2014-02-09 DIAGNOSIS — S61209A Unspecified open wound of unspecified finger without damage to nail, initial encounter: Secondary | ICD-10-CM | POA: Diagnosis not present

## 2014-02-23 ENCOUNTER — Other Ambulatory Visit: Payer: Self-pay

## 2014-02-23 MED ORDER — LISINOPRIL-HYDROCHLOROTHIAZIDE 10-12.5 MG PO TABS: 1.0000 | ORAL_TABLET | Freq: Every day | ORAL | Status: DC

## 2014-02-23 NOTE — Telephone Encounter (Signed)
Patient called [facility] stating that she now has new insurance and need a refill for lisinopril HCTZ sen to [facility]. RX sent as requested to new pharmacy

## 2014-03-26 ENCOUNTER — Encounter (HOSPITAL_COMMUNITY): Payer: Self-pay | Admitting: Emergency Medicine

## 2014-03-26 ENCOUNTER — Emergency Department (HOSPITAL_COMMUNITY)
Admission: EM | Admit: 2014-03-26 | Discharge: 2014-03-27 | Disposition: A | Discharge status: Home / Self Care | Payer: Medicare Other | Attending: Emergency Medicine | Admitting: Emergency Medicine

## 2014-03-26 DIAGNOSIS — S61219A Laceration without foreign body of unspecified finger without damage to nail, initial encounter: Secondary | ICD-10-CM

## 2014-03-26 DIAGNOSIS — S61209A Unspecified open wound of unspecified finger without damage to nail, initial encounter: Secondary | ICD-10-CM | POA: Diagnosis not present

## 2014-03-26 DIAGNOSIS — S0990XA Unspecified injury of head, initial encounter: Secondary | ICD-10-CM | POA: Diagnosis not present

## 2014-03-26 DIAGNOSIS — S62609B Fracture of unspecified phalanx of unspecified finger, initial encounter for open fracture: Secondary | ICD-10-CM

## 2014-03-26 DIAGNOSIS — H05231 Hemorrhage of right orbit: Secondary | ICD-10-CM

## 2014-03-26 DIAGNOSIS — E559 Vitamin D deficiency, unspecified: Secondary | ICD-10-CM | POA: Insufficient documentation

## 2014-03-26 DIAGNOSIS — Z8639 Personal history of other endocrine, nutritional and metabolic disease: Secondary | ICD-10-CM | POA: Insufficient documentation

## 2014-03-26 DIAGNOSIS — Y9389 Activity, other specified: Secondary | ICD-10-CM | POA: Insufficient documentation

## 2014-03-26 DIAGNOSIS — Z862 Personal history of diseases of the blood and blood-forming organs and certain disorders involving the immune mechanism: Secondary | ICD-10-CM | POA: Insufficient documentation

## 2014-03-26 DIAGNOSIS — I10 Essential (primary) hypertension: Secondary | ICD-10-CM | POA: Insufficient documentation

## 2014-03-26 DIAGNOSIS — S62639A Displaced fracture of distal phalanx of unspecified finger, initial encounter for closed fracture: Secondary | ICD-10-CM | POA: Diagnosis not present

## 2014-03-26 DIAGNOSIS — IMO0002 Reserved for concepts with insufficient information to code with codable children: Secondary | ICD-10-CM | POA: Insufficient documentation

## 2014-03-26 DIAGNOSIS — S0510XA Contusion of eyeball and orbital tissues, unspecified eye, initial encounter: Secondary | ICD-10-CM | POA: Diagnosis not present

## 2014-03-26 DIAGNOSIS — W19XXXA Unspecified fall, initial encounter: Secondary | ICD-10-CM

## 2014-03-26 DIAGNOSIS — H05239 Hemorrhage of unspecified orbit: Secondary | ICD-10-CM | POA: Insufficient documentation

## 2014-03-26 DIAGNOSIS — W010XXA Fall on same level from slipping, tripping and stumbling without subsequent striking against object, initial encounter: Secondary | ICD-10-CM | POA: Insufficient documentation

## 2014-03-26 DIAGNOSIS — Y9289 Other specified places as the place of occurrence of the external cause: Secondary | ICD-10-CM | POA: Insufficient documentation

## 2014-03-26 DIAGNOSIS — S62609A Fracture of unspecified phalanx of unspecified finger, initial encounter for closed fracture: Secondary | ICD-10-CM | POA: Diagnosis not present

## 2014-03-26 NOTE — ED Notes (Signed)
The pt fell approx 30 minutes and struck her head on a drawer then caught her rt ring finger on  Some type object. Also.  No loc.  Bleeding controlled

## 2014-03-27 ENCOUNTER — Emergency Department (HOSPITAL_COMMUNITY): Payer: Medicare Other

## 2014-03-27 DIAGNOSIS — S62609A Fracture of unspecified phalanx of unspecified finger, initial encounter for closed fracture: Secondary | ICD-10-CM | POA: Diagnosis not present

## 2014-03-27 MED ORDER — CEPHALEXIN 500 MG PO CAPS: 500.0000 mg | ORAL_CAPSULE | Freq: Four times a day (QID) | ORAL | Status: DC

## 2014-03-27 NOTE — ED Notes (Signed)
MD/PA at bedside. 

## 2014-03-27 NOTE — ED Provider Notes (Signed)
CSN: [id number]     Arrival date & time 03/26/14  2307 History   First MD Initiated Contact with Patient 03/26/14 2324     Chief Complaint  Patient presents with  . Fall     (Consider location/radiation/quality/duration/timing/severity/associated sxs/prior Treatment) HPI  Patient presents with laceration to right index finger. States that she was in her "junk room" and slipped on some dried leaves that were on the wooden floor. States that she fell forward, striking her head on a file cabinet in her right hand got stuck in the file cabinet, cutting her finger. She has a small bruise over her right eyelid. Patient states that this does not hurt at all. Patient denies any difficulty with extraocular movements denies any change in her vision or eye pain. Patient denies headache, neck pain, back pain, any other symptoms. States that the only pain she has is in her finger. Denies weakness or numbness of the extremities.  She is not on blood thinners.  Pt is right handed.  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    Past Medical History  Diagnosis Date  . Hyperlipidemia   . Hypertension   . Cataract 2009    right-Dr. Gershon Crane  . Retinal tear 2008    B- Dr. Charolette Forward  . Vitamin D deficiency    Past Surgical History  Procedure Laterality Date  . Cataract extraction      Dr. Gershon Crane  . Retinal tear repair cryotherapy      Dr. Charolette Forward   Family History  Problem Relation Age of Onset  . COPD Father   . COPD Sister   . Hyperlipidemia Other    History  Substance Use Topics  . Smoking status: Never Smoker   . Smokeless tobacco: Not on file  . Alcohol Use: No   OB History   Grav Para Term Preterm Abortions TAB SAB Ect Mult Living                 Review of Systems  All other systems reviewed and are negative.     Allergies  Atorvastatin; Ezetimibe-simvastatin; Fish oil; Livalo; Lovastatin; Rosuvastatin; and Zetia  Home Medications   Prior to Admission medications    Medication Sig Start Date End Date Taking? Authorizing Provider  ascorbic acid (VITAMIN C) 500 MG tablet Take 500 mg by mouth daily.    Historical Provider, MD  aspirin 325 MG tablet Take 325 mg by mouth 2 (two) times a week.    Historical Provider, MD  Calcium Carbonate-Vitamin D (CALTRATE 600+D) 600-400 MG-UNIT per tablet Take 2 tablets by mouth daily. 2-3/week    Historical Provider, MD  calcium elemental as carbonate (BARIATRIC TUMS ULTRA) 400 MG tablet Chew 1,000 mg by mouth 3 (three) times daily.    Historical Provider, MD  cholecalciferol (VITAMIN D) 1000 UNITS tablet Take 1,000 Units by mouth 2 (two) times daily.      Historical Provider, MD  cyanocobalamin 1000 MCG tablet Take 1,000 mcg by mouth daily.    Historical Provider, MD  Diclofenac Sodium (PENNSAID) 1.5 % SOLN Place 3-5 drops onto the skin 3 (three) times daily.      Historical Provider, MD  Flaxseed, Linseed, (FLAX SEED OIL) 1000 MG CAPS Take 1 capsule by mouth daily.      Historical Provider, MD  lisinopril-hydrochlorothiazide (PRINZIDE,ZESTORETIC) 10-12.5 MG per tablet Take 1 tablet by mouth daily. 02/23/14   Aleksei [name] V, MD  metFORMIN (GLUCOPHAGE) 500 MG tablet TAKE 1 TABLET (500 MG  TOTAL) BY MOUTH 2 (TWO) TIMES DAILY WITH A MEAL. 05/16/12   Aleksei [name] V, MD  Omega-3 Fatty Acids (FISH OIL) 1000 MG CAPS Take 1 capsule by mouth 2 (two) times daily.      Historical Provider, MD   BP 166/68  Pulse 82  Temp(Src) 98.3 F (36.8 C)  Resp 18  Ht 5\' 1"  (1.549 m)  Wt 132 lb (59.875 kg)  BMI 24.95 kg/m2  SpO2 98% Physical Exam  Nursing note and vitals reviewed. Constitutional: She appears well-developed and well-nourished. No distress.  HENT:  Head: Normocephalic and atraumatic.    Neck: Neck supple.  Pulmonary/Chest: Effort normal.  Musculoskeletal:  Spine nontender, no crepitus, or stepoffs.   Neurological: She is alert. She has normal strength. No cranial nerve deficit or sensory deficit. Gait normal. GCS  eye subscore is 4. GCS verbal subscore is 5. GCS motor subscore is 6.  Skin: She is not diaphoretic.  Right 4th finger with laceration of volar aspect/tuft.  Sensation intact.  Light bleeding.  Full AROM of all fingers.      ED Course  Procedures (including critical care time) Labs Review Labs Reviewed - No data to display  Imaging Review Dg Hand Complete Right  03/27/2014   CLINICAL DATA:  Laceration to the ring finger on the right hand after injury.  EXAM: RIGHT HAND - COMPLETE 3+ VIEW  COMPARISON:  None.  FINDINGS: Displaced dorsal plate fracture off of the distal phalanx of the fourth finger with associated angulation of the distal interphalangeal joint. Overlying gauze obscures soft tissue detail but there appears to be a large laceration along the volar surface of the distal fourth finger. No additional fractures are soft tissue changes are demonstrated. Mild degenerative changes.  IMPRESSION: Volar plate fracture along the distal phalanx of the fourth finger with large soft tissue laceration suggested.   Electronically Signed   By: Lucienne Capers M.D.   On: 03/27/2014 01:01     EKG Interpretation None      Dr Sharol Given made aware of the patient.   1:49 AM Discussed pt with Dr Sharol Given.  Will consult hand surgery.   LACERATION REPAIR Performed by: Clayton Bibles Authorized by: Clayton Bibles Consent: Verbal consent obtained. Risks and benefits: risks, benefits and alternatives were discussed Consent given by: patient Patient identity confirmed: provided demographic data Prepped and Draped in normal sterile fashion Wound explored  Laceration Location: 4th finger, right hand   Laceration Length: 4cm  No Foreign Bodies seen or palpated  Anesthesia: digital block  Local anesthetic: lidocaine 2% no epinephrine  Anesthetic total: 4 ml  Irrigation method: syringe Amount of cleaning: standard  Skin closure: 5-0 ethilon  Number of sutures: 11  Technique: simple interrupted,  loosely approximated  Patient tolerance: Patient tolerated the procedure well with no immediate complications.    MDM   Final diagnoses:  Finger laceration, initial encounter  Phalanx (hand) fracture, open, initial encounter  Periorbital hematoma, right  Fall, initial encounter    Pt with mechanical slip and fall with small hematoma to right eye.  No eye pain, visual changes, headache, pain anywhere other than the finger.  Neurologically intact.  Full AROM of finger and sensation intact.  Found to have dorsal plate fracture off distal phalanx.  Consulted hand surgery, concern for open fracture.  Pt to be seen by Dr Burney Gauze in the office on Tuesday.  Laceration approximated in ED, placed in splint in full extension per Dr Burney Gauze.  D/C home with keflex.  Pt declined pain medication.  Discussed result, findings, treatment, and follow up  with patient.  Pt given return precautions.  Pt verbalizes understanding and agrees with plan.        Clayton Bibles, PA-C 03/27/14 0230

## 2014-03-27 NOTE — ED Provider Notes (Signed)
Medical screening examination/treatment/procedure(s) were conducted as a shared visit with non-physician practitioner(s) and myself.  I personally evaluated the patient during the encounter.   EKG Interpretation None     Pt seen with PA.  Pt s/p mechanical fall, has right middle finger fingertip laceration and dorsal plate fracture.  Consult to hand completed, has f/u on Tuesday.  Wound cleaned thoroughly and repaired per PA West.    Kalman Drape, MD 03/27/14 [phone]

## 2014-03-27 NOTE — Discharge Instructions (Signed)
Read the information below.  Use the prescribed medication as directed.  Please discuss all new medications with your pharmacist.  You Regala return to the Emergency Department at any time for worsening condition or any new symptoms that concern you.  Keep your wound clean and covered and in complete extension in the brace.  If you develop redness, swelling, pus draining from the wound, or fevers greater than 100.4, return to the ER immediately for a recheck.      Laceration Care, Adult A laceration is a cut or lesion that goes through all layers of the skin and into the tissue just beneath the skin. TREATMENT  Some lacerations Belfiore not require closure. Some lacerations Crites not be able to be closed due to an increased risk of infection. It is important to see your caregiver as soon as possible after an injury to minimize the risk of infection and maximize the opportunity for successful closure. If closure is appropriate, pain medicines Millett be given, if needed. The wound will be cleaned to help prevent infection. Your caregiver will use stitches (sutures), staples, wound glue (adhesive), or skin adhesive strips to repair the laceration. These tools bring the skin edges together to allow for faster healing and a better cosmetic outcome. However, all wounds will heal with a scar. Once the wound has healed, scarring can be minimized by covering the wound with sunscreen during the day for 1 full year. HOME CARE INSTRUCTIONS  For sutures or staples:  Keep the wound clean and dry.  If you were given a bandage (dressing), you should change it at least once a day. Also, change the dressing if it becomes wet or dirty, or as directed by your caregiver.  Wash the wound with soap and water 2 times a day. Rinse the wound off with water to remove all soap. Pat the wound dry with a clean towel.  After cleaning, apply a thin layer of the antibiotic ointment as recommended by your caregiver. This will help prevent  infection and keep the dressing from sticking.  You Custer shower as usual after the first 24 hours. Do not soak the wound in water until the sutures are removed.  Only take over-the-counter or prescription medicines for pain, discomfort, or fever as directed by your caregiver.  Get your sutures or staples removed as directed by your caregiver. For skin adhesive strips:  Keep the wound clean and dry.  Do not get the skin adhesive strips wet. You Hubbs bathe carefully, using caution to keep the wound dry.  If the wound gets wet, pat it dry with a clean towel.  Skin adhesive strips will fall off on their own. You Nocera trim the strips as the wound heals. Do not remove skin adhesive strips that are still stuck to the wound. They will fall off in time. For wound adhesive:  You Silber briefly wet your wound in the shower or bath. Do not soak or scrub the wound. Do not swim. Avoid periods of heavy perspiration until the skin adhesive has fallen off on its own. After showering or bathing, gently pat the wound dry with a clean towel.  Do not apply liquid medicine, cream medicine, or ointment medicine to your wound while the skin adhesive is in place. This Burcher loosen the film before your wound is healed.  If a dressing is placed over the wound, be careful not to apply tape directly over the skin adhesive. This Myles cause the adhesive to be pulled off before  the wound is healed.  Avoid prolonged exposure to sunlight or tanning lamps while the skin adhesive is in place. Exposure to ultraviolet light in the first year will darken the scar.  The skin adhesive will usually remain in place for 5 to 10 days, then naturally fall off the skin. Do not pick at the adhesive film. You Bona need a tetanus shot if:  You cannot remember when you had your last tetanus shot.  You have never had a tetanus shot. If you get a tetanus shot, your arm Billig swell, get red, and feel warm to the touch. This is common and not a  problem. If you need a tetanus shot and you choose not to have one, there is a rare chance of getting tetanus. Sickness from tetanus can be serious. SEEK MEDICAL CARE IF:   You have redness, swelling, or increasing pain in the wound.  You see a red line that goes away from the wound.  You have yellowish-white fluid (pus) coming from the wound.  You have a fever.  You notice a bad smell coming from the wound or dressing.  Your wound breaks open before or after sutures have been removed.  You notice something coming out of the wound such as wood or glass.  Your wound is on your hand or foot and you cannot move a finger or toe. SEEK IMMEDIATE MEDICAL CARE IF:   Your pain is not controlled with prescribed medicine.  You have severe swelling around the wound causing pain and numbness or a change in color in your arm, hand, leg, or foot.  Your wound splits open and starts bleeding.  You have worsening numbness, weakness, or loss of function of any joint around or beyond the wound.  You develop painful lumps near the wound or on the skin anywhere on your body. MAKE SURE YOU:   Understand these instructions.  Will watch your condition.  Will get help right away if you are not doing well or get worse. Document Released: 09/03/2005 Document Revised: 11/26/2011 Document Reviewed: 02/27/2011 [facility] Patient Information 2015 [place], [place]. This information is not intended to replace advice given to you by your health care provider. Make sure you discuss any questions you have with your health care provider.  Finger Fracture Fractures of fingers are breaks in the bones of the fingers. There are many types of fractures. There are different ways of treating these fractures. Your health care provider will discuss the best way to treat your fracture. CAUSES Traumatic injury is the main cause of broken fingers. These include:  Injuries while playing sports.  Workplace  injuries.  Falls. RISK FACTORS Activities that can increase your risk of finger fractures include:  Sports.  Workplace activities that involve machinery.  A condition called osteoporosis, which can make your bones less dense and cause them to fracture more easily. SIGNS AND SYMPTOMS The main symptoms of a broken finger are pain and swelling within 15 minutes after the injury. Other symptoms include:  Bruising of your finger.  Stiffness of your finger.  Numbness of your finger.  Exposed bones (compound fracture) if the fracture is severe. DIAGNOSIS  The best way to diagnose a broken bone is with X-ray imaging. Additionally, your health care provider will use this X-ray image to evaluate the position of the broken finger bones.  TREATMENT  Finger fractures can be treated with:   Nonreduction--This means the bones are in place. The finger is splinted without changing the positions of  the bone pieces. The splint is usually left on for about a week to 10 days. This will depend on your fracture and what your health care provider thinks.  Closed reduction--The bones are put back into position without using surgery. The finger is then splinted.  Open reduction and internal fixation--The fracture site is opened. Then the bone pieces are fixed into place with pins or some type of hardware. This is seldom required. It depends on the severity of the fracture. HOME CARE INSTRUCTIONS   Follow your health care provider's instructions regarding activities, exercises, and physical therapy.  Only take over-the-counter or prescription medicines for pain, discomfort, or fever as directed by your health care provider. SEEK MEDICAL CARE IF: You have pain or swelling that limits the motion or use of your fingers. SEEK IMMEDIATE MEDICAL CARE IF:  Your finger becomes numb. MAKE SURE YOU:   Understand these instructions.  Will watch your condition.  Will get help right away if you are not doing  well or get worse. Document Released: 12/16/2000 Document Revised: 06/24/2013 Document Reviewed: 04/15/2013 [facility] Patient Information 2015 [place], [place]. This information is not intended to replace advice given to you by your health care provider. Make sure you discuss any questions you have with your health care provider.

## 2014-03-30 DIAGNOSIS — M20019 Mallet finger of unspecified finger(s): Secondary | ICD-10-CM | POA: Diagnosis not present

## 2014-04-08 DIAGNOSIS — M20019 Mallet finger of unspecified finger(s): Secondary | ICD-10-CM | POA: Diagnosis not present

## 2014-04-14 DIAGNOSIS — M20019 Mallet finger of unspecified finger(s): Secondary | ICD-10-CM | POA: Diagnosis not present

## 2014-04-20 ENCOUNTER — Ambulatory Visit (INDEPENDENT_AMBULATORY_CARE_PROVIDER_SITE_OTHER): Payer: Medicare Other | Admitting: Internal Medicine

## 2014-04-20 ENCOUNTER — Encounter: Payer: Self-pay | Admitting: Internal Medicine

## 2014-04-20 VITALS — BP 144/80 | HR 69 | Temp 98.3°F | Ht 61.0 in | Wt 131.8 lb

## 2014-04-20 DIAGNOSIS — M2420 Disorder of ligament, unspecified site: Secondary | ICD-10-CM

## 2014-04-20 DIAGNOSIS — G5700 Lesion of sciatic nerve, unspecified lower limb: Secondary | ICD-10-CM

## 2014-04-20 DIAGNOSIS — M7632 Iliotibial band syndrome, left leg: Secondary | ICD-10-CM

## 2014-04-20 DIAGNOSIS — R296 Repeated falls: Secondary | ICD-10-CM

## 2014-04-20 DIAGNOSIS — G5702 Lesion of sciatic nerve, left lower limb: Secondary | ICD-10-CM

## 2014-04-20 DIAGNOSIS — I10 Essential (primary) hypertension: Secondary | ICD-10-CM

## 2014-04-20 DIAGNOSIS — Z9181 History of falling: Secondary | ICD-10-CM | POA: Diagnosis not present

## 2014-04-20 DIAGNOSIS — M629 Disorder of muscle, unspecified: Secondary | ICD-10-CM

## 2014-04-20 DIAGNOSIS — M7630 Iliotibial band syndrome, unspecified leg: Secondary | ICD-10-CM | POA: Insufficient documentation

## 2014-04-20 NOTE — Progress Notes (Signed)
Pre visit review using our clinic review tool, if applicable. No additional management support is needed unless otherwise documented below in the visit note. 

## 2014-04-20 NOTE — Patient Instructions (Signed)
Spiky ball

## 2014-04-20 NOTE — Assessment & Plan Note (Signed)
8/15 LLE Masage

## 2014-04-20 NOTE — Assessment & Plan Note (Signed)
Continue with current prescription therapy as reflected on the Med list.  

## 2014-04-20 NOTE — Assessment & Plan Note (Signed)
Summer 2015 The patient fell trice this summer landing on LLE - all accidental. No LOC. Says she has a good balance, doing yoga. Denies any neurologic sx's. No ETOH.  Sports Med Ref

## 2014-04-20 NOTE — Progress Notes (Signed)
   Subjective:    HPI  The patient fell trice this summer landing on LLE - all accidental. No LOC. Says she has a good balance, doing yoga. Denies any neurologic sx's. No ETOH.  The patient presents for a follow-up of  chronic hypertension, chronic dyslipidemia and elev glu controlled with diet some   Review of Systems  Constitutional: Negative for appetite change.  HENT: Negative for sneezing.   Respiratory: Negative for cough and choking.   Gastrointestinal: Negative for anal bleeding.  Genitourinary: Negative for decreased urine volume.  Musculoskeletal: Negative for back pain.  Skin: Negative for color change.  Psychiatric/Behavioral: Negative for dysphoric mood.       Wt Readings from Last 3 Encounters:  04/20/14 131 lb 12.8 oz (59.784 kg)  03/26/14 132 lb (59.875 kg)  10/27/13 130 lb (58.968 kg)   BP Readings from Last 3 Encounters:  04/20/14 144/80  03/27/14 141/80  10/27/13 140/86     Objective:   Physical Exam  Constitutional: She appears well-developed and well-nourished. No distress.  HENT:  Head: Normocephalic.  Right Ear: External ear normal.  Left Ear: External ear normal.  Nose: Nose normal.  Mouth/Throat: Oropharynx is clear and moist.  Eyes: Conjunctivae are normal. Pupils are equal, round, and reactive to light. Right eye exhibits no discharge. Left eye exhibits no discharge.  Neck: Normal range of motion. Neck supple. No JVD present. No tracheal deviation present. No thyromegaly present.  Cardiovascular: Normal rate, regular rhythm and normal heart sounds.   Pulmonary/Chest: No stridor. No respiratory distress. She has no wheezes.  Abdominal: Soft. Bowel sounds are normal. She exhibits no distension and no mass. There is no tenderness. There is no rebound and no guarding.  Musculoskeletal: She exhibits no edema and no tenderness.  Lymphadenopathy:    She has no cervical adenopathy.  Neurological: She displays normal reflexes. No cranial nerve  deficit. She exhibits normal muscle tone. Coordination normal.  Skin: No rash noted. No erythema.  Psychiatric: She has a normal mood and affect. Her behavior is normal. Judgment and thought content normal.  Str leg elev neg B Hips WNL L IT is a little tender L buttock palp is painful, sx's are reproducible      Lab Results  Component Value Date   WBC 4.9 07/09/2012   HGB 14.3 07/09/2012   HCT 42.7 07/09/2012   PLT 267.0 07/09/2012   CHOL 284* 07/09/2012   TRIG 146.0 07/09/2012   HDL 49.10 07/09/2012   LDLDIRECT 211.6 07/09/2012   ALT 27 10/27/2013   AST 21 10/27/2013   NA 141 10/27/2013   K 4.3 10/27/2013   CL 107 10/27/2013   CREATININE 0.6 10/27/2013   BUN 12 10/27/2013   CO2 27 10/27/2013   TSH 0.62 07/09/2012   HGBA1C 5.9 10/27/2013      Assessment & Plan:

## 2014-04-20 NOTE — Assessment & Plan Note (Addendum)
8/15 s/p repeat falls. The patient fell trice this summer landing on LLE - all accidental. No LOC. Says she has a good balance, doing yoga. Denies any neurologic sx's. No ETOH.  Will consult Dr Tamala Julian Massage, Ibuprofen prn, stretch

## 2014-04-21 ENCOUNTER — Telehealth: Payer: Self-pay | Admitting: Internal Medicine

## 2014-04-21 NOTE — Telephone Encounter (Signed)
Relevant patient education assigned to patient using Emmi. ° °

## 2014-04-22 DIAGNOSIS — M20019 Mallet finger of unspecified finger(s): Secondary | ICD-10-CM | POA: Diagnosis not present

## 2014-04-27 ENCOUNTER — Ambulatory Visit (INDEPENDENT_AMBULATORY_CARE_PROVIDER_SITE_OTHER): Payer: Medicare Other | Admitting: Internal Medicine

## 2014-04-27 ENCOUNTER — Encounter: Payer: Self-pay | Admitting: Internal Medicine

## 2014-04-27 ENCOUNTER — Other Ambulatory Visit (INDEPENDENT_AMBULATORY_CARE_PROVIDER_SITE_OTHER): Payer: Medicare Other

## 2014-04-27 VITALS — BP 156/74 | HR 72 | Temp 97.8°F | Wt 134.0 lb

## 2014-04-27 DIAGNOSIS — G5700 Lesion of sciatic nerve, unspecified lower limb: Secondary | ICD-10-CM

## 2014-04-27 DIAGNOSIS — E785 Hyperlipidemia, unspecified: Secondary | ICD-10-CM

## 2014-04-27 DIAGNOSIS — M79605 Pain in left leg: Secondary | ICD-10-CM

## 2014-04-27 DIAGNOSIS — I10 Essential (primary) hypertension: Secondary | ICD-10-CM | POA: Diagnosis not present

## 2014-04-27 DIAGNOSIS — R7309 Other abnormal glucose: Secondary | ICD-10-CM | POA: Diagnosis not present

## 2014-04-27 DIAGNOSIS — M629 Disorder of muscle, unspecified: Secondary | ICD-10-CM | POA: Diagnosis not present

## 2014-04-27 DIAGNOSIS — M7632 Iliotibial band syndrome, left leg: Secondary | ICD-10-CM

## 2014-04-27 DIAGNOSIS — M79609 Pain in unspecified limb: Secondary | ICD-10-CM

## 2014-04-27 DIAGNOSIS — G5702 Lesion of sciatic nerve, left lower limb: Secondary | ICD-10-CM

## 2014-04-27 LAB — LIPID PANEL
CHOL/HDL RATIO: 5
Cholesterol: 289 mg/dL — ABNORMAL HIGH (ref 0–200)
HDL: 54.5 mg/dL (ref >39.00)
NONHDL: 234.5
Triglycerides: 231 mg/dL — ABNORMAL HIGH (ref 0.0–149.0)
VLDL: 46.2 mg/dL — ABNORMAL HIGH (ref 0.0–40.0)

## 2014-04-27 LAB — BASIC METABOLIC PANEL
BUN: 12 mg/dL (ref 6–23)
CHLORIDE: 102 meq/L (ref 96–112)
CO2: 24 meq/L (ref 19–32)
Calcium: 9.3 mg/dL (ref 8.4–10.5)
Creatinine, Ser: 0.6 mg/dL (ref 0.4–1.2)
GFR: 116.74 mL/min (ref >60.00)
Glucose, Bld: 91 mg/dL (ref 70–99)
POTASSIUM: 3.7 meq/L (ref 3.5–5.1)
Sodium: 138 mEq/L (ref 135–145)

## 2014-04-27 LAB — CBC WITH DIFFERENTIAL/PLATELET
BASOS PCT: 0.7 % (ref 0.0–3.0)
Basophils Absolute: 0 10*3/uL (ref 0.0–0.1)
Eosinophils Absolute: 0.1 10*3/uL (ref 0.0–0.7)
Eosinophils Relative: 2 % (ref 0.0–5.0)
HEMATOCRIT: 43.3 % (ref 36.0–46.0)
Hemoglobin: 14.6 g/dL (ref 12.0–15.0)
LYMPHS ABS: 1.8 10*3/uL (ref 0.7–4.0)
Lymphocytes Relative: 31.6 % (ref 12.0–46.0)
MCHC: 33.6 g/dL (ref 30.0–36.0)
MCV: 88.3 fl (ref 78.0–100.0)
MONO ABS: 0.4 10*3/uL (ref 0.1–1.0)
Monocytes Relative: 7 % (ref 3.0–12.0)
NEUTROS ABS: 3.3 10*3/uL (ref 1.4–7.7)
Neutrophils Relative %: 58.7 % (ref 43.0–77.0)
Platelets: 290 10*3/uL (ref 150.0–400.0)
RBC: 4.91 Mil/uL (ref 3.87–5.11)
RDW: 12.5 % (ref 11.5–15.5)
WBC: 5.6 10*3/uL (ref 4.0–10.5)

## 2014-04-27 LAB — LDL CHOLESTEROL, DIRECT: LDL DIRECT: 214.3 mg/dL

## 2014-04-27 LAB — HEMOGLOBIN A1C: HEMOGLOBIN A1C: 5.9 % (ref 4.6–6.5)

## 2014-04-27 NOTE — Assessment & Plan Note (Signed)
Cont w/stretching

## 2014-04-27 NOTE — Assessment & Plan Note (Signed)
Continue with current prescription therapy as reflected on the Med list.  

## 2014-04-27 NOTE — Assessment & Plan Note (Signed)
Better  F/u w/Dr Tamala Julian is pending

## 2014-04-27 NOTE — Progress Notes (Signed)
   Subjective:    HPI  F/u L buttock and L leg pain - better   The patient presents for a follow-up of  chronic hypertension, chronic dyslipidemia and elev glu controlled with diet some   Review of Systems  Constitutional: Negative for appetite change.  HENT: Negative for sneezing.   Respiratory: Negative for cough and choking.   Gastrointestinal: Negative for anal bleeding.  Genitourinary: Negative for decreased urine volume.  Musculoskeletal: Negative for back pain.  Skin: Negative for color change.  Psychiatric/Behavioral: Negative for dysphoric mood.       Wt Readings from Last 3 Encounters:  04/27/14 134 lb (60.782 kg)  04/20/14 131 lb 12.8 oz (59.784 kg)  03/26/14 132 lb (59.875 kg)   BP Readings from Last 3 Encounters:  04/27/14 156/74  04/20/14 144/80  03/27/14 141/80     Objective:   Physical Exam  Constitutional: She appears well-developed and well-nourished. No distress.  HENT:  Head: Normocephalic.  Right Ear: External ear normal.  Left Ear: External ear normal.  Nose: Nose normal.  Mouth/Throat: Oropharynx is clear and moist.  Eyes: Conjunctivae are normal. Pupils are equal, round, and reactive to light. Right eye exhibits no discharge. Left eye exhibits no discharge.  Neck: Normal range of motion. Neck supple. No JVD present. No tracheal deviation present. No thyromegaly present.  Cardiovascular: Normal rate, regular rhythm and normal heart sounds.   Pulmonary/Chest: No stridor. No respiratory distress. She has no wheezes.  Abdominal: Soft. Bowel sounds are normal. She exhibits no distension and no mass. There is no tenderness. There is no rebound and no guarding.  Musculoskeletal: She exhibits no edema and no tenderness.  Lymphadenopathy:    She has no cervical adenopathy.  Neurological: She displays normal reflexes. No cranial nerve deficit. She exhibits normal muscle tone. Coordination normal.  Skin: No rash noted. No erythema.  Psychiatric: She  has a normal mood and affect. Her behavior is normal. Judgment and thought content normal.       Lab Results  Component Value Date   WBC 4.9 07/09/2012   HGB 14.3 07/09/2012   HCT 42.7 07/09/2012   PLT 267.0 07/09/2012   CHOL 284* 07/09/2012   TRIG 146.0 07/09/2012   HDL 49.10 07/09/2012   LDLDIRECT 211.6 07/09/2012   ALT 27 10/27/2013   AST 21 10/27/2013   NA 141 10/27/2013   K 4.3 10/27/2013   CL 107 10/27/2013   CREATININE 0.6 10/27/2013   BUN 12 10/27/2013   CO2 27 10/27/2013   TSH 0.62 07/09/2012   HGBA1C 5.9 10/27/2013      Assessment & Plan:

## 2014-04-27 NOTE — Progress Notes (Signed)
Pre visit review using our clinic review tool, if applicable. No additional management support is needed unless otherwise documented below in the visit note. 

## 2014-04-27 NOTE — Assessment & Plan Note (Signed)
A1c

## 2014-04-29 ENCOUNTER — Encounter: Payer: Self-pay | Admitting: Family Medicine

## 2014-04-29 ENCOUNTER — Ambulatory Visit (INDEPENDENT_AMBULATORY_CARE_PROVIDER_SITE_OTHER): Payer: Medicare Other | Admitting: Family Medicine

## 2014-04-29 VITALS — BP 132/82 | HR 63 | Ht 61.0 in | Wt 133.0 lb

## 2014-04-29 DIAGNOSIS — G5700 Lesion of sciatic nerve, unspecified lower limb: Secondary | ICD-10-CM | POA: Diagnosis not present

## 2014-04-29 DIAGNOSIS — IMO0002 Reserved for concepts with insufficient information to code with codable children: Secondary | ICD-10-CM

## 2014-04-29 DIAGNOSIS — G5702 Lesion of sciatic nerve, left lower limb: Secondary | ICD-10-CM

## 2014-04-29 DIAGNOSIS — M5416 Radiculopathy, lumbar region: Secondary | ICD-10-CM

## 2014-04-29 MED ORDER — PREDNISONE 20 MG PO TABS: 40.0000 mg | ORAL_TABLET | Freq: Every day | ORAL | Status: DC

## 2014-04-29 NOTE — Assessment & Plan Note (Signed)
I'm concerned and patient's back pain is actually secondary to lumbar radiculopathy more than the piriformis syndrome itself. We are going to treat with prednisone for five-day burs. 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 We discussed an icing regimen and was given home exercise program that she will alternate with the piriformis exercises. We discussed the importance of core strengthening and hip abductors too. Patient will try these interventions and come back and see me again in 3 weeks. Patient continues to have trouble we Hirt need to consider further imaging such as a repeat of the MRI as well as x-rays.

## 2014-04-29 NOTE — Patient Instructions (Addendum)
Good to meet you Ice 20 minutes 2 times daily. Usually after activity and before bed. Exercises 3 times a week. Alternate between back and piriformis.  Tennis  Ball in back left pocket with sitting Prednisone 40mg  daily for 5 days.  Exercises on wall.  Heel and butt touching.  Raise leg 6 inches and hold 2 seconds.  Down slow for count of 4 seconds.  1 set of 30 reps daily on both sides.  Vitamin D 2000 IU daily.  Turmeric 500mg  twice daily.  Fish oil 2 grams daily.  Come back in 3 weeks.

## 2014-04-29 NOTE — Assessment & Plan Note (Signed)

## 2014-04-29 NOTE — Progress Notes (Signed)
[facility] [place], [place] [address] Phone: [phone] Subjective:    I'm seeing this patient by the request  of:  Walker Kehr, MD   CC: Left hip and buttocks pain  [id number] Lisa [name] is a 68 y.o. female coming in with complaint of left hip and buttocks pain. 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 Patient states she is having more of a left-sided buttocks pain for multiple months. Patient has actually had 3 falls over the course of the last 3 months. Patient states that it is a dull aching pain that can radiate towards lateral aspect of her leg. Patient was seen by primary care provider and was given some stretches as well as going to physical therapy with some mild improvement. Patient is able to do daily activities but certain range of motion and twisting sensations can cause severe pain. Patient describes the pain as more of a dull aching and a burning sensation when it occurs. Braces severity a 7/10. Patient states that the pain can wake her up at night.     Past medical history, social, surgical and family history all reviewed in electronic medical record.   Review of Systems: No headache, visual changes, nausea, vomiting, diarrhea, constipation, dizziness, abdominal pain, skin rash, fevers, chills, night sweats, weight loss, swollen lymph nodes, body aches, joint swelling, muscle aches, chest pain, shortness of breath, mood changes.   Objective Blood pressure 132/82, pulse 63, height 5\' 1"  (1.549 m), weight 133 lb (60.328 kg), SpO2 98.00%.  General: No apparent distress alert and oriented x3 mood and affect normal, dressed appropriately.  HEENT: Pupils equal, extraocular movements intact  Respiratory: Patient's speak in full sentences and does not appear short of breath  Cardiovascular: No lower extremity edema, non tender, no erythema    Skin: Warm dry intact with no signs of infection or rash on extremities or on axial skeleton.  Abdomen: Soft nontender  Neuro: Cranial nerves II through XII are intact, neurovascularly intact in all extremities with 2+ DTRs and 2+ pulses.  Lymph: No lymphadenopathy of posterior or anterior cervical chain or axillae bilaterally.  Gait normal with good balance and coordination.  MSK:  Non tender with full range of motion and good stability and symmetric strength and tone of shoulders, elbows, wrist,  knee and ankles bilaterally.  Back Exam:  Inspection: Unremarkable  Motion: Flexion 45 deg, Extension 25 deg, Side Bending to 35 deg bilaterally,  Rotation to 35 deg bilaterally  SLR laying: Positive left side  XSLR laying: Negative  Palpable tenderness: Positive piriformis.  FABER: Positive left Sensory change: Gross sensation intact to all lumbar and sacral dermatomes.  Reflexes: 2+ at both patellar tendons, 2+ at achilles tendons, Babinski's downgoing.  Strength at foot  Plantar-flexion: 5/5 Dorsi-flexion: 5/5 Eversion: 5/5 Inversion: 5/5  Leg strength  Quad: 5/5 Hamstring: 5/5 Hip flexor: 5/5 Hip abductors: 3+/5  Gait unremarkable. Hip: Left ROM IR: 25 Deg, ER: 45 Deg, Flexion: 120 Deg, Extension: 100 Deg, Abduction: 45 Deg, Adduction: 45 Deg Strength IR: 5/5, ER: 5/5, Flexion: 5/5, Extension: 5/5, Abduction: 5/5, Adduction: 5/5 Pelvic alignment unremarkable to inspection and palpation. Standing hip rotation and gait without trendelenburg sign / unsteadiness. Greater trochanter with moderate tenderness No pain  FADIR. No SI joint tenderness and normal minimal SI movement. Contralateral hip unremarkable    Impression and Recommendations:  This case required medical decision making of moderate complexity.

## 2014-05-02 ENCOUNTER — Encounter: Payer: Self-pay | Admitting: Internal Medicine

## 2014-05-02 MED ORDER — METHYLPREDNISOLONE ACETATE 40 MG/ML IJ SUSP: 40.0000 mg | Freq: Once | INTRAMUSCULAR | Status: DC

## 2014-05-06 DIAGNOSIS — M20019 Mallet finger of unspecified finger(s): Secondary | ICD-10-CM | POA: Diagnosis not present

## 2014-05-10 DIAGNOSIS — M20019 Mallet finger of unspecified finger(s): Secondary | ICD-10-CM | POA: Diagnosis not present

## 2014-05-13 DIAGNOSIS — M20019 Mallet finger of unspecified finger(s): Secondary | ICD-10-CM | POA: Diagnosis not present

## 2014-05-20 ENCOUNTER — Encounter: Payer: Self-pay | Admitting: Family Medicine

## 2014-05-20 ENCOUNTER — Ambulatory Visit (INDEPENDENT_AMBULATORY_CARE_PROVIDER_SITE_OTHER): Payer: Medicare Other | Admitting: Family Medicine

## 2014-05-20 ENCOUNTER — Ambulatory Visit (INDEPENDENT_AMBULATORY_CARE_PROVIDER_SITE_OTHER)
Admission: RE | Admit: 2014-05-20 | Discharge: 2014-05-20 | Disposition: A | Discharge status: Home / Self Care | Payer: Medicare Other | Source: Ambulatory Visit | Attending: Family Medicine | Admitting: Family Medicine

## 2014-05-20 VITALS — BP 134/74 | HR 84 | Ht 61.0 in | Wt 131.0 lb

## 2014-05-20 DIAGNOSIS — M5137 Other intervertebral disc degeneration, lumbosacral region: Secondary | ICD-10-CM | POA: Diagnosis not present

## 2014-05-20 DIAGNOSIS — M5416 Radiculopathy, lumbar region: Secondary | ICD-10-CM

## 2014-05-20 DIAGNOSIS — IMO0002 Reserved for concepts with insufficient information to code with codable children: Secondary | ICD-10-CM

## 2014-05-20 DIAGNOSIS — M4310 Spondylolisthesis, site unspecified: Secondary | ICD-10-CM | POA: Diagnosis not present

## 2014-05-20 MED ORDER — MELOXICAM 7.5 MG PO TABS: 7.5000 mg | ORAL_TABLET | Freq: Every day | ORAL | Status: DC

## 2014-05-20 NOTE — Progress Notes (Signed)
[name] [name], [place] [address] Phone: [phone] Subjective:     CC: Left hip and buttocks pain followup  [id number] Lisa [name] is a 68 y.o. female coming in with complaint of left hip and buttocks pain. 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   Patient did have more  piriformis syndrome but there was a concern for possible lumbar radiculopathy. Patient was given prednisone as well as home exercises for these 2 problems. Patient states unfortunately she has not made any significant improvement. Patient states that actually if anything the pain has started to worsen over the course of time. Patient states that she is having some radicular symptoms also down the right leg which is new. Patient was doing more exercises as well as bowling a more frequent basis. Patient has been in pain sometimes severe enough to make her stop doing any activity. Patient denies any weakness or numbness the leg. Patient states that the pain can be so severe that it does wake her up at night. Patient is adamant that she would like to not take anything for pain medicine lines but has noticed she has been taking Aleve a more regular basis.     Past medical history, social, surgical and family history all reviewed in electronic medical record.   Review of Systems: No headache, visual changes, nausea, vomiting, diarrhea, constipation, dizziness, abdominal pain, skin rash, fevers, chills, night sweats, weight loss, swollen lymph nodes, body aches, joint swelling, muscle aches, chest pain, shortness of breath, mood changes.   Objective Blood pressure 134/74, pulse 84, height 5\' 1"  (1.549 m), weight 131 lb (59.421 kg), SpO2 97.00%.  General: No apparent distress alert and oriented x3 mood and affect normal, dressed appropriately.  HEENT: Pupils equal, extraocular movements intact    Respiratory: Patient's speak in full sentences and does not appear short of breath  Cardiovascular: No lower extremity edema, non tender, no erythema  Skin: Warm dry intact with no signs of infection or rash on extremities or on axial skeleton.  Abdomen: Soft nontender  Neuro: Cranial nerves II through XII are intact, neurovascularly intact in all extremities with 2+ DTRs and 2+ pulses.  Lymph: No lymphadenopathy of posterior or anterior cervical chain or axillae bilaterally.  Gait normal with good balance and coordination.  MSK:  Non tender with full range of motion and good stability and symmetric strength and tone of shoulders, elbows, wrist,  knee and ankles bilaterally.  Back Exam:  Inspection: Unremarkable  Motion: Flexion 35 deg, Extension 25 deg, Side Bending to 35 deg bilaterally,  Rotation to 35 deg bilaterally  SLR laying: Positive left side  XSLR laying: Negative  Palpable tenderness: Positive piriformis tenderness and moderately worse than previous exam.  FABER: Positive left Sensory change: Gross sensation intact to all lumbar and sacral dermatomes.  Reflexes: 2+ at both patellar tendons, 2+ at achilles tendons, Babinski's downgoing.  Strength at foot  Plantar-flexion: 5/5 Dorsi-flexion: 5/5 Eversion: 5/5 Inversion: 5/5  Leg strength  Quad: 5/5 Hamstring: 5/5 Hip flexor: 5/5 Hip abductors: 3+/5 with no improvement from previous exam Gait unremarkable. Hip: Left ROM IR: 25 Deg, ER: 45 Deg, Flexion: 120 Deg, Extension: 100 Deg, Abduction: 45 Deg, Adduction: 45 Deg Strength IR: 5/5, ER: 5/5, Flexion: 5/5, Extension: 5/5, Abduction: 5/5, Adduction: 5/5 Pelvic alignment unremarkable to inspection and palpation. Standing hip rotation and gait without  trendelenburg sign / unsteadiness. Greater trochanter with moderate tenderness No pain  FADIR. No SI joint tenderness and normal minimal SI movement. Contralateral hip unremarkable    Impression and Recommendations:      This case required medical decision making of moderate complexity.

## 2014-05-20 NOTE — Patient Instructions (Signed)
Good to see you We will get xrays downstairs today and I will release them to you.  Ice is always your friend.  meloxicam daily for 5 days then as needed.  Physical therapy will be calling.  Continue the exercises.  Continue the vitamin D and the Turmeric and fish oil Consider glucosamine 1500mg  daily.  Come back in 3-4 weeks to make sure you are doing better.

## 2014-05-20 NOTE — Assessment & Plan Note (Signed)
Patient continues to have lumbar radicular symptoms. I do not think that this is secondary to patient's hip. Patient does have some piriformis syndrome and we discussed the possibility of formal physical therapy which patient declined. Patient actually states that formal physical therapy worsened it previously. Patient though was in enough pain to agree to a new prescription medication was given gabapentin to try a night. Patient was given other home exercises for more range of motion exercises that I think will be beneficial. We'll start him to try a topical medication and prescription was sent in. Warned of potential side effects. Patient will try these interventions and come back again in 3-4 weeks. X-rays are pending. If patient has any worsening pain we [facility] need to consider further imaging.  Spent greater than 25 minutes with patient face-to-face and had greater than 50% of counseling including as described above in assessment and plan.

## 2014-05-27 ENCOUNTER — Ambulatory Visit: Payer: Medicare Other | Attending: Family Medicine | Admitting: Physical Therapy

## 2014-05-27 DIAGNOSIS — IMO0001 Reserved for inherently not codable concepts without codable children: Secondary | ICD-10-CM | POA: Insufficient documentation

## 2014-05-27 DIAGNOSIS — M25579 Pain in unspecified ankle and joints of unspecified foot: Secondary | ICD-10-CM | POA: Insufficient documentation

## 2014-05-27 DIAGNOSIS — M25559 Pain in unspecified hip: Secondary | ICD-10-CM | POA: Diagnosis not present

## 2014-05-27 DIAGNOSIS — R293 Abnormal posture: Secondary | ICD-10-CM | POA: Insufficient documentation

## 2014-05-27 DIAGNOSIS — M6281 Muscle weakness (generalized): Secondary | ICD-10-CM | POA: Diagnosis not present

## 2014-05-27 DIAGNOSIS — M545 Low back pain, unspecified: Secondary | ICD-10-CM | POA: Diagnosis not present

## 2014-05-31 DIAGNOSIS — M20019 Mallet finger of unspecified finger(s): Secondary | ICD-10-CM | POA: Diagnosis not present

## 2014-06-07 ENCOUNTER — Ambulatory Visit: Payer: Medicare Other | Admitting: Physical Therapy

## 2014-06-07 DIAGNOSIS — M6281 Muscle weakness (generalized): Secondary | ICD-10-CM | POA: Diagnosis not present

## 2014-06-07 DIAGNOSIS — R293 Abnormal posture: Secondary | ICD-10-CM | POA: Diagnosis not present

## 2014-06-07 DIAGNOSIS — M25579 Pain in unspecified ankle and joints of unspecified foot: Secondary | ICD-10-CM | POA: Diagnosis not present

## 2014-06-07 DIAGNOSIS — IMO0001 Reserved for inherently not codable concepts without codable children: Secondary | ICD-10-CM | POA: Diagnosis not present

## 2014-06-07 DIAGNOSIS — M25559 Pain in unspecified hip: Secondary | ICD-10-CM | POA: Diagnosis not present

## 2014-06-07 DIAGNOSIS — M545 Low back pain, unspecified: Secondary | ICD-10-CM | POA: Diagnosis not present

## 2014-06-09 ENCOUNTER — Ambulatory Visit: Payer: Medicare Other | Admitting: Physical Therapy

## 2014-06-09 DIAGNOSIS — M545 Low back pain, unspecified: Secondary | ICD-10-CM | POA: Diagnosis not present

## 2014-06-09 DIAGNOSIS — M25559 Pain in unspecified hip: Secondary | ICD-10-CM | POA: Diagnosis not present

## 2014-06-09 DIAGNOSIS — R293 Abnormal posture: Secondary | ICD-10-CM | POA: Diagnosis not present

## 2014-06-09 DIAGNOSIS — M25579 Pain in unspecified ankle and joints of unspecified foot: Secondary | ICD-10-CM | POA: Diagnosis not present

## 2014-06-09 DIAGNOSIS — IMO0001 Reserved for inherently not codable concepts without codable children: Secondary | ICD-10-CM | POA: Diagnosis not present

## 2014-06-09 DIAGNOSIS — M6281 Muscle weakness (generalized): Secondary | ICD-10-CM | POA: Diagnosis not present

## 2014-06-10 DIAGNOSIS — M20019 Mallet finger of unspecified finger(s): Secondary | ICD-10-CM | POA: Diagnosis not present

## 2014-06-14 ENCOUNTER — Ambulatory Visit: Payer: Medicare Other | Admitting: Physical Therapy

## 2014-06-14 DIAGNOSIS — IMO0001 Reserved for inherently not codable concepts without codable children: Secondary | ICD-10-CM | POA: Diagnosis not present

## 2014-06-14 DIAGNOSIS — M25579 Pain in unspecified ankle and joints of unspecified foot: Secondary | ICD-10-CM | POA: Diagnosis not present

## 2014-06-14 DIAGNOSIS — M25559 Pain in unspecified hip: Secondary | ICD-10-CM | POA: Diagnosis not present

## 2014-06-14 DIAGNOSIS — M545 Low back pain, unspecified: Secondary | ICD-10-CM | POA: Diagnosis not present

## 2014-06-14 DIAGNOSIS — M6281 Muscle weakness (generalized): Secondary | ICD-10-CM | POA: Diagnosis not present

## 2014-06-14 DIAGNOSIS — R293 Abnormal posture: Secondary | ICD-10-CM | POA: Diagnosis not present

## 2014-06-16 ENCOUNTER — Ambulatory Visit (INDEPENDENT_AMBULATORY_CARE_PROVIDER_SITE_OTHER): Payer: Medicare Other | Admitting: Family Medicine

## 2014-06-16 ENCOUNTER — Encounter: Payer: Self-pay | Admitting: Family Medicine

## 2014-06-16 VITALS — BP 140/80 | HR 69 | Temp 97.9°F | Wt 132.1 lb

## 2014-06-16 DIAGNOSIS — IMO0002 Reserved for concepts with insufficient information to code with codable children: Secondary | ICD-10-CM

## 2014-06-16 DIAGNOSIS — M5416 Radiculopathy, lumbar region: Secondary | ICD-10-CM

## 2014-06-16 MED ORDER — MELOXICAM 7.5 MG PO TABS: 7.5000 mg | ORAL_TABLET | Freq: Every day | ORAL | Status: DC

## 2014-06-16 NOTE — Progress Notes (Signed)
Pre visit review using our clinic review tool, if applicable. No additional management support is needed unless otherwise documented below in the visit note. 

## 2014-06-16 NOTE — Patient Instructions (Addendum)
Great to see you and you are doing great Conitnue with the therapy  Ice is still your friend.  Continue the medications except the meloxicam which is now as needed.  Continue the vitamins Continue my exercises 3 times a week Work on lifting position See me again in 6 weeks.

## 2014-06-16 NOTE — Assessment & Plan Note (Signed)
Patient does have some lumbar radiculopathy and I do think that patient's MRI with the L3-L4 nerve root compression could be consistent with her findings. Patient though is responding well to conservative therapy including formal physical therapy at this time. We discussed continuing the supplementation as well as home exercises that she is doing. Patient was given phase II exercises today that I think will also be beneficial for strengthening. Encourage her to avoid significant extension as well as we discussed proper lifting techniques. Patient still has some weakness in the legs that she needs to try to monitor. Patient is then work with physical therapy at this time and then follow up with me again in 6 weeks for further evaluation and treatment.  Spent greater than 25 minutes with patient face-to-face and had greater than 50% of counseling including as described above in assessment and plan.

## 2014-06-16 NOTE — Progress Notes (Signed)
  [facility] [place] [place], [place] [address] Phone: [phone] Subjective:     CC: Left hip and buttocks pain followup  [id number] Lisa [name] is a 68 y.o. female coming in with complaint of left hip and buttocks pain. 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 Patient states that she continues with conservative therapy as well as the over-the-counter medications. Patient states that overall she continues to improve. Discussed icing protocol. Patient also states that she is working with a therapist for a regular basis and has noticed some improvement as well.       Past medical history, social, surgical and family history all reviewed in electronic medical record.   Review of Systems: No headache, visual changes, nausea, vomiting, diarrhea, constipation, dizziness, abdominal pain, skin rash, fevers, chills, night sweats, weight loss, swollen lymph nodes, body aches, joint swelling, muscle aches, chest pain, shortness of breath, mood changes.   Objective Blood pressure 140/80, pulse 69, temperature 97.9 F (36.6 C), temperature source Oral, weight 132 lb 1.9 oz (59.929 kg), SpO2 97.00%.  General: No apparent distress alert and oriented x3 mood and affect normal, dressed appropriately.  HEENT: Pupils equal, extraocular movements intact  Respiratory: Patient's speak in full sentences and does not appear short of breath  Cardiovascular: No lower extremity edema, non tender, no erythema  Skin: Warm dry intact with no signs of infection or rash on extremities or on axial skeleton.  Abdomen: Soft nontender  Neuro: Cranial nerves II through XII are intact, neurovascularly intact in all extremities with 2+ DTRs and 2+ pulses.  Lymph: No lymphadenopathy of posterior or anterior cervical chain or axillae bilaterally.  Gait normal with good balance and coordination.    MSK:  Non tender with full range of motion and good stability and symmetric strength and tone of shoulders, elbows, wrist,  knee and ankles bilaterally.  Back Exam:  Inspection: Unremarkable  Motion: Flexion 35 deg, Extension 25 deg, Side Bending to 35 deg bilaterally,  Rotation to 35 deg bilaterally  SLR laying: Positive left side  XSLR laying: Negative  Palpable tenderness: Mild piriformis tenderness but significantly improved from previous exam. FABER: Positive left but less than previous exam Sensory change: Gross sensation intact to all lumbar and sacral dermatomes.  Reflexes: 2+ at both patellar tendons, 2+ at achilles tendons, Babinski's downgoing.  Strength at foot  Plantar-flexion: 5/5 Dorsi-flexion: 5/5 Eversion: 5/5 Inversion: 5/5  Leg strength  Quad: 5/5 Hamstring: 5/5 Hip flexor: 5/5 Hip abductors: 3+/5 with no improvement from previous exam Gait unremarkable. Hip: Left ROM IR: 25 Deg, ER: 45 Deg, Flexion: 120 Deg, Extension: 100 Deg, Abduction: 45 Deg, Adduction: 45 Deg Strength IR: 5/5, ER: 5/5, Flexion: 5/5, Extension: 5/5, Abduction: 5/5, Adduction: 5/5 Pelvic alignment unremarkable to inspection and palpation. Standing hip rotation and gait without trendelenburg sign / unsteadiness. Greater trochanter with moderate tenderness No pain  FADIR. No SI joint tenderness and normal minimal SI movement. Contralateral hip unremarkable    Impression and Recommendations:     This case required medical decision making of moderate complexity.

## 2014-06-17 ENCOUNTER — Ambulatory Visit: Payer: Medicare Other | Attending: Family Medicine | Admitting: Physical Therapy

## 2014-06-17 ENCOUNTER — Other Ambulatory Visit: Payer: Self-pay

## 2014-06-17 DIAGNOSIS — M545 Low back pain: Secondary | ICD-10-CM | POA: Diagnosis not present

## 2014-06-17 DIAGNOSIS — M25571 Pain in right ankle and joints of right foot: Secondary | ICD-10-CM | POA: Diagnosis not present

## 2014-06-17 DIAGNOSIS — Z1239 Encounter for other screening for malignant neoplasm of breast: Secondary | ICD-10-CM

## 2014-06-17 DIAGNOSIS — Z1231 Encounter for screening mammogram for malignant neoplasm of breast: Secondary | ICD-10-CM

## 2014-06-17 DIAGNOSIS — R293 Abnormal posture: Secondary | ICD-10-CM | POA: Insufficient documentation

## 2014-06-17 DIAGNOSIS — Z5189 Encounter for other specified aftercare: Secondary | ICD-10-CM | POA: Diagnosis not present

## 2014-06-17 DIAGNOSIS — M25552 Pain in left hip: Secondary | ICD-10-CM | POA: Insufficient documentation

## 2014-06-17 DIAGNOSIS — M6281 Muscle weakness (generalized): Secondary | ICD-10-CM | POA: Diagnosis not present

## 2014-06-21 ENCOUNTER — Encounter: Payer: Medicare Other | Admitting: Physical Therapy

## 2014-06-23 ENCOUNTER — Ambulatory Visit: Payer: Medicare Other | Admitting: Physical Therapy

## 2014-06-23 DIAGNOSIS — Z5189 Encounter for other specified aftercare: Secondary | ICD-10-CM | POA: Diagnosis not present

## 2014-06-29 ENCOUNTER — Encounter: Payer: Medicare Other | Admitting: Rehabilitation

## 2014-07-01 ENCOUNTER — Ambulatory Visit: Payer: Medicare Other

## 2014-07-01 DIAGNOSIS — Z5189 Encounter for other specified aftercare: Secondary | ICD-10-CM | POA: Diagnosis not present

## 2014-07-06 ENCOUNTER — Other Ambulatory Visit: Payer: Medicare Other

## 2014-07-06 ENCOUNTER — Ambulatory Visit: Payer: Medicare Other | Admitting: Physical Therapy

## 2014-07-06 DIAGNOSIS — Z1231 Encounter for screening mammogram for malignant neoplasm of breast: Secondary | ICD-10-CM

## 2014-07-06 DIAGNOSIS — Z5189 Encounter for other specified aftercare: Secondary | ICD-10-CM | POA: Diagnosis not present

## 2014-07-08 ENCOUNTER — Ambulatory Visit
Admission: RE | Admit: 2014-07-08 | Discharge: 2014-07-08 | Disposition: A | Discharge status: Home / Self Care | Payer: Medicare Other | Source: Ambulatory Visit

## 2014-07-08 DIAGNOSIS — Z1231 Encounter for screening mammogram for malignant neoplasm of breast: Secondary | ICD-10-CM

## 2014-07-20 DIAGNOSIS — M20011 Mallet finger of right finger(s): Secondary | ICD-10-CM | POA: Diagnosis not present

## 2014-07-20 DIAGNOSIS — M79644 Pain in right finger(s): Secondary | ICD-10-CM | POA: Diagnosis not present

## 2014-07-28 ENCOUNTER — Encounter: Payer: Self-pay | Admitting: Family Medicine

## 2014-07-28 ENCOUNTER — Ambulatory Visit (INDEPENDENT_AMBULATORY_CARE_PROVIDER_SITE_OTHER): Payer: Medicare Other | Admitting: Family Medicine

## 2014-07-28 VITALS — BP 124/78 | HR 74 | Ht 61.0 in | Wt 129.0 lb

## 2014-07-28 DIAGNOSIS — G5702 Lesion of sciatic nerve, left lower limb: Secondary | ICD-10-CM

## 2014-07-28 NOTE — Progress Notes (Signed)
[name] [address] [place], [place] [address] Phone: [phone] Subjective:     CC: Left hip and buttocks pain followup  [id number] Lisa [name] is a 68 y.o. female coming in with complaint of left hip and buttocks pain. 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 Patient states that she continues with conservative therapy as well as the over-the-counter medications. Patient states that overall she continues to improve. Discussed icing protocol. Patient also states that she is working with a therapist for a regular basis and has noticed some improvement as well. Patient states that overall the pain has decreased significantly. 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 Patient has actually decreased using the meloxicam and is not taking any medications other than the natural supplementation. Denies any new symptoms.        Past medical history, social, surgical and family history all reviewed in electronic medical record.   Review of Systems: No headache, visual changes, nausea, vomiting, diarrhea, constipation, dizziness, abdominal pain, skin rash, fevers, chills, night sweats, weight loss, swollen lymph nodes, body aches, joint swelling, muscle aches, chest pain, shortness of breath, mood changes.   Objective Blood pressure 124/78, pulse 74, height 5\' 1"  (1.549 m), weight 129 lb (58.514 kg), SpO2 96 %.  General: No apparent distress alert and oriented x3 mood and affect normal, dressed appropriately.  HEENT: Pupils equal, extraocular movements intact  Respiratory: Patient's speak in full sentences and does not appear short of breath  Cardiovascular: No lower extremity edema, non tender, no erythema  Skin: Warm dry intact with no signs of  infection or rash on extremities or on axial skeleton.  Abdomen: Soft nontender  Neuro: Cranial nerves II through XII are intact, neurovascularly intact in all extremities with 2+ DTRs and 2+ pulses.  Lymph: No lymphadenopathy of posterior or anterior cervical chain or axillae bilaterally.  Gait normal with good balance and coordination.  MSK:  Non tender with full range of motion and good stability and symmetric strength and tone of shoulders, elbows, wrist,  knee and ankles bilaterally.  Back Exam:  Inspection: Unremarkable  Motion: Flexion 35 deg, Extension 25 deg, Side Bending to 35 deg bilaterally,  Rotation to 35 deg bilaterally  SLR laying: Positive left side  XSLR laying: Negative  Palpable tenderness: minimal tenderness over the piriformis FABER: Positive left but less than previous exam Sensory change: Gross sensation intact to all lumbar and sacral dermatomes.  Reflexes: 2+ at both patellar tendons, 2+ at achilles tendons, Babinski's downgoing.  Strength at foot  Plantar-flexion: 5/5 Dorsi-flexion: 5/5 Eversion: 5/5 Inversion: 5/5  Leg strength  Quad: 5/5 Hamstring: 5/5 Hip flexor: 5/5 Hip abductors: 3+/5 with no improvement from previous exam Gait unremarkable. Hip: Left ROM IR: 25 Deg, ER: 45 Deg, Flexion: 120 Deg, Extension: 100 Deg, Abduction: 45 Deg, Adduction: 45 Deg Strength IR: 5/5, ER: 5/5, Flexion: 5/5, Extension: 5/5, Abduction: 5/5, Adduction: 5/5 Pelvic alignment unremarkable to inspection and palpation. Standing hip rotation and gait without trendelenburg sign / unsteadiness. Greater trochanter nontender which is an improvement from previous exam No pain  FADIR. No SI joint tenderness and normal minimal SI movement. Contralateral hip unremarkable    Impression and Recommendations:     This case required medical decision making  of moderate complexity.

## 2014-07-28 NOTE — Assessment & Plan Note (Signed)
Patient is doing remarkably well at this time. Patient does have some moderate spinal stenosis I will likely give her some exacerbation from time to time that his lungs patient continues to respond to conservative therapy will continue. Patient will continue to go to formal physical therapy and we discussed getting patient into a more routine regimen of going to the gym 3 times a week. We discussed continuing the natural supplementations and when having pain to take meloxicam daily for 3 days to try to decrease any inflammation that can occur. Otherwise patient will follow-up with me in 6 weeks to make sure that she continues to improve.  Spent greater than 25 minutes with patient face-to-face and had greater than 50% of counseling including as described above in assessment and plan.

## 2014-07-28 NOTE — Patient Instructions (Signed)
It is great to see you.  You are doing amazing.  Ice still after activity Try to eat witihin 30 minutes of working out.  Continue the exercises and would start silver sneakers When sitting, straighten leg and do pulses focusing on strengthening the quads.  If feeling good Exercises on wall.  Heel and butt touching.  Raise leg 6 inches and hold 2 seconds.  Down slow for count of 4 seconds.  1 set of 30 reps daily on both sides.  Not letting you go yet, would love to see you in 2 months.

## 2014-10-06 ENCOUNTER — Encounter: Payer: Self-pay | Admitting: Family Medicine

## 2014-10-06 ENCOUNTER — Ambulatory Visit (INDEPENDENT_AMBULATORY_CARE_PROVIDER_SITE_OTHER): Payer: Medicare Other | Admitting: Family Medicine

## 2014-10-06 VITALS — BP 144/72 | HR 92 | Ht 61.0 in | Wt 127.0 lb

## 2014-10-06 DIAGNOSIS — M5416 Radiculopathy, lumbar region: Secondary | ICD-10-CM

## 2014-10-06 NOTE — Progress Notes (Signed)
  [facility] [address] [place], [place] [address] Phone: [phone] Subjective:     CC: Left hip and buttocks pain followup  [id number] Lisa [name] is a 69 y.o. female coming in with complaint of left hip and buttocks pain. 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 Patient states that she continues with conservative therapy as well as the over-the-counter medications. Patient has been doing very well. Patient continues to work out on her regular basis and do the exercises regularly. Patient states that they radicular symptoms from her back has seemed to improve. Patient is very happy with the results at this time. Continuing the chart supplementations and does use meloxicam as needed.        Past medical history, social, surgical and family history all reviewed in electronic medical record.   Review of Systems: No headache, visual changes, nausea, vomiting, diarrhea, constipation, dizziness, abdominal pain, skin rash, fevers, chills, night sweats, weight loss, swollen lymph nodes, body aches, joint swelling, muscle aches, chest pain, shortness of breath, mood changes.   Objective Blood pressure 144/72, pulse 92, height 5\' 1"  (1.549 m), weight 127 lb (57.607 kg), SpO2 98 %.  General: No apparent distress alert and oriented x3 mood and affect normal, dressed appropriately.  HEENT: Pupils equal, extraocular movements intact  Respiratory: Patient's speak in full sentences and does not appear short of breath  Cardiovascular: No lower extremity edema, non tender, no erythema  Skin: Warm dry intact with no signs of infection or rash on extremities or on axial skeleton.  Abdomen: Soft nontender  Neuro: Cranial nerves II through XII are intact, neurovascularly intact in all extremities with 2+ DTRs and 2+ pulses.  Lymph: No lymphadenopathy of posterior or anterior cervical  chain or axillae bilaterally.  Gait normal with good balance and coordination.  MSK:  Non tender with full range of motion and good stability and symmetric strength and tone of shoulders, elbows, wrist,  knee and ankles bilaterally.  Back Exam:  Inspection: Unremarkable  Motion: Flexion 35 deg, Extension 25 deg, Side Bending to 35 deg bilaterally,  Rotation to 35 deg bilaterally  SLR laying: Positive left side  XSLR laying: Negative  Palpable tenderness: minimal tenderness over the piriformis FABER: Positive left but continued improvement Sensory change: Gross sensation intact to all lumbar and sacral dermatomes.  Reflexes: 2+ at both patellar tendons, 2+ at achilles tendons, Babinski's downgoing.  Strength at foot  Plantar-flexion: 5/5 Dorsi-flexion: 5/5 Eversion: 5/5 Inversion: 5/5  Leg strength  Quad: 5/5 Hamstring: 5/5 Hip flexor: 5/5 Hip abductors: 3+/5 with no improvement from previous exam Gait unremarkable. Hip: Left ROM IR: 25 Deg, ER: 45 Deg, Flexion: 120 Deg, Extension: 100 Deg, Abduction: 45 Deg, Adduction: 45 Deg Strength IR: 5/5, ER: 5/5, Flexion: 5/5, Extension: 5/5, Abduction: 5/5, Adduction: 5/5 Pelvic alignment unremarkable to inspection and palpation. Standing hip rotation and gait without trendelenburg sign / unsteadiness. Greater trochanter nontender No pain  FADIR. No SI joint tenderness and normal minimal SI movement. Contralateral hip unremarkable No significant change from previous exam but mild improvement   Impression and Recommendations:     This case required medical decision making of moderate complexity.

## 2014-10-06 NOTE — Assessment & Plan Note (Signed)
Patient's underlying problem of the spinal stenosis and L3-L4 left-sided compression is likely contributing to most of her pain. Patient was doing very well with conservative therapy. Differential also includes the piriformis syndrome. Patient will continue with the exercises that she already has. She'll continue with the medications. Patient will continue to try to use meloxicam sparingly. Patient will follow-up with me again in 6 weeks for further evaluation and treatment.

## 2014-10-06 NOTE — Patient Instructions (Addendum)
You are amazing! Ice is still your friend Meloxicam daily for 3 days when you need it.  Try the pennsaid twice daily.  Keep up the natural supplements.  Keep being active.  You have a little calcified area behind your knee. Consider a compression sleeve. Lets follow up again in 8 weeks.

## 2014-11-02 ENCOUNTER — Encounter: Payer: Self-pay | Admitting: Internal Medicine

## 2014-11-02 ENCOUNTER — Ambulatory Visit (INDEPENDENT_AMBULATORY_CARE_PROVIDER_SITE_OTHER): Payer: Medicare Other | Admitting: Internal Medicine

## 2014-11-02 VITALS — BP 138/62 | HR 88 | Temp 98.0°F | Ht 61.0 in | Wt 127.0 lb

## 2014-11-02 DIAGNOSIS — Z23 Encounter for immunization: Secondary | ICD-10-CM | POA: Diagnosis not present

## 2014-11-02 DIAGNOSIS — I10 Essential (primary) hypertension: Secondary | ICD-10-CM

## 2014-11-02 DIAGNOSIS — G5702 Lesion of sciatic nerve, left lower limb: Secondary | ICD-10-CM

## 2014-11-02 DIAGNOSIS — E559 Vitamin D deficiency, unspecified: Secondary | ICD-10-CM

## 2014-11-02 DIAGNOSIS — Z0000 Encounter for general adult medical examination without abnormal findings: Secondary | ICD-10-CM | POA: Diagnosis not present

## 2014-11-02 DIAGNOSIS — E785 Hyperlipidemia, unspecified: Secondary | ICD-10-CM

## 2014-11-02 MED ORDER — LISINOPRIL-HYDROCHLOROTHIAZIDE 10-12.5 MG PO TABS: 1.0000 | ORAL_TABLET | Freq: Every day | ORAL | Status: DC

## 2014-11-02 NOTE — Assessment & Plan Note (Signed)
Continue with current prescription therapy as reflected on the Med list.  

## 2014-11-02 NOTE — Assessment & Plan Note (Signed)

## 2014-11-02 NOTE — Assessment & Plan Note (Signed)
Will try Cholestoff OTC

## 2014-11-02 NOTE — Assessment & Plan Note (Signed)
Better  

## 2014-11-02 NOTE — Patient Instructions (Addendum)
[place]   Preventive Care for Adults A healthy lifestyle and preventive care can promote health and wellness. Preventive health guidelines for women include the following key practices.  A routine yearly physical is a good way to check with your health care provider about your health and preventive screening. It is a chance to share any concerns and updates on your health and to receive a thorough exam.  Visit your dentist for a routine exam and preventive care every 6 months. Brush your teeth twice a day and floss once a day. Good oral hygiene prevents tooth decay and gum disease.  The frequency of eye exams is based on your age, health, family medical history, use of contact lenses, and other factors. Follow your health care provider's recommendations for frequency of eye exams.  Eat a healthy diet. Foods like vegetables, fruits, whole grains, low-fat dairy products, and lean protein foods contain the nutrients Lisa need without too many calories. Decrease your intake of foods high in solid fats, added sugars, and salt. Eat the right amount of calories for Lisa.Get information about a proper diet from your health care provider, if necessary.  Regular physical exercise is one of the most important things Lisa can do for your health. Most adults should get at least 150 minutes of moderate-intensity exercise (any activity that increases your heart rate and causes Lisa to sweat) each week. In addition, most adults need muscle-strengthening exercises on 2 or more days a week.  Maintain a healthy weight. The body mass index (BMI) is a screening tool to identify possible weight problems. It provides an estimate of body fat based on height and weight. Your health care provider can find your BMI and can help Lisa achieve or maintain a healthy weight.For adults 20 years and older:  A BMI below 18.5 is considered underweight.  A BMI of 18.5 to 24.9 is normal.  A BMI of 25 to 29.9 is considered  overweight.  A BMI of 30 and above is considered obese.  Maintain normal blood lipids and cholesterol levels by exercising and minimizing your intake of saturated fat. Eat a balanced diet with plenty of fruit and vegetables. Blood tests for lipids and cholesterol should begin at age 29 and be repeated every 5 years. If your lipid or cholesterol levels are high, Lisa are over 50, or Lisa are at high risk for heart disease, Lisa Lisa [name] need your cholesterol levels checked more frequently.Ongoing high lipid and cholesterol levels should be treated with medicines if diet and exercise are not working.  If Lisa smoke, find out from your health care provider how to quit. If you do not use tobacco, do not start.  Lung cancer screening is recommended for adults aged 44-80 years who are at high risk for developing lung cancer because of a history of smoking. A yearly low-dose CT scan of the lungs is recommended for people who have at least a 30-pack-year history of smoking and are a current smoker or have quit within the past 15 years. A pack year of smoking is smoking an average of 1 pack of cigarettes a day for 1 year (for example: 1 pack a day for 30 years or 2 packs a day for 15 years). Yearly screening should continue until the smoker has stopped smoking for at least 15 years. Yearly screening should be stopped for people who develop a health problem that would prevent them from having lung cancer treatment.  If Lisa are pregnant, do not drink alcohol. If  Lisa are breastfeeding, be very cautious about drinking alcohol. If Lisa are not pregnant and choose to drink alcohol, do not have more than 1 drink per day. One drink is considered to be 12 ounces (355 mL) of beer, 5 ounces (148 mL) of wine, or 1.5 ounces (44 mL) of liquor.  Avoid use of street drugs. Do not share needles with anyone. Ask for help if Lisa need support or instructions about stopping the use of drugs.  High blood pressure causes heart disease and  increases the risk of stroke. Your blood pressure should be checked at least every 1 to 2 years. Ongoing high blood pressure should be treated with medicines if weight loss and exercise do not work.  If Lisa are 63-1 years old, ask your health care provider if Lisa should take aspirin to prevent strokes.  Diabetes screening involves taking a blood sample to check your fasting blood sugar level. This should be done once every 3 years, after age 72, if Lisa are within normal weight and without risk factors for diabetes. Testing should be considered at a younger age or be carried out more frequently if Lisa are overweight and have at least 1 risk factor for diabetes.  Breast cancer screening is essential preventive care for women. Lisa should practice "breast self-awareness." This means understanding the normal appearance and feel of your breasts and Lisa [name] include breast self-examination. Any changes detected, no matter how small, should be reported to a health care provider. Women in their 66s and 30s should have a clinical breast exam (CBE) by a health care provider as part of a regular health exam every 1 to 3 years. After age 44, women should have a CBE every year. Starting at age 38, women should consider having a mammogram (breast X-ray test) every year. Women who have a family history of breast cancer should talk to their health care provider about genetic screening. Women at a high risk of breast cancer should talk to their health care providers about having an MRI and a mammogram every year.  Breast cancer gene (BRCA)-related cancer risk assessment is recommended for women who have family members with BRCA-related cancers. BRCA-related cancers include breast, ovarian, tubal, and peritoneal cancers. Having family members with these cancers Lisa [name] be associated with an increased risk for harmful changes (mutations) in the breast cancer genes BRCA1 and BRCA2. Results of the assessment will determine the need for  genetic counseling and BRCA1 and BRCA2 testing.  Routine pelvic exams to screen for cancer are no longer recommended for nonpregnant women who are considered low risk for cancer of the pelvic organs (ovaries, uterus, and vagina) and who do not have symptoms. Ask your health care provider if a screening pelvic exam is right for Lisa.  If Lisa have had past treatment for cervical cancer or a condition that could lead to cancer, Lisa need Pap tests and screening for cancer for at least 20 years after your treatment. If Pap tests have been discontinued, your risk factors (such as having a new sexual partner) need to be reassessed to determine if screening should be resumed. Some women have medical problems that increase the chance of getting cervical cancer. In these cases, your health care provider Lisa [name] recommend more frequent screening and Pap tests.  The HPV test is an additional test that Lisa [name] be used for cervical cancer screening. The HPV test looks for the virus that can cause the cell changes on the cervix. The cells collected during the Pap  test can be tested for HPV. The HPV test could be used to screen women aged 38 years and older, and should be used in women of any age who have unclear Pap test results. After the age of 4, women should have HPV testing at the same frequency as a Pap test.  Colorectal cancer can be detected and often prevented. Most routine colorectal cancer screening begins at the age of 49 years and continues through age 28 years. However, your health care provider Lisa [name] recommend screening at an earlier age if Lisa have risk factors for colon cancer. On a yearly basis, your health care provider Lisa [name] provide home test kits to check for hidden blood in the stool. Use of a small camera at the end of a tube, to directly examine the colon (sigmoidoscopy or colonoscopy), can detect the earliest forms of colorectal cancer. Talk to your health care provider about this at age 12, when routine  screening begins. Direct exam of the colon should be repeated every 5-10 years through age 86 years, unless early forms of pre-cancerous polyps or small growths are found.  People who are at an increased risk for hepatitis B should be screened for this virus. Lisa are considered at high risk for hepatitis B if:  Lisa were born in a country where hepatitis B occurs often. Talk with your health care provider about which countries are considered high risk.  Your parents were born in a high-risk country and Lisa have not received a shot to protect against hepatitis B (hepatitis B vaccine).  Lisa have HIV or AIDS.  Lisa use needles to inject street drugs.  Lisa live with, or have sex with, someone who has hepatitis B.  Lisa get hemodialysis treatment.  Lisa take certain medicines for conditions like cancer, organ transplantation, and autoimmune conditions.  Hepatitis C blood testing is recommended for all people born from [birth year] through 1965 and any individual with known risks for hepatitis C.  Practice safe sex. Use condoms and avoid high-risk sexual practices to reduce the spread of sexually transmitted infections (STIs). STIs include gonorrhea, chlamydia, syphilis, trichomonas, herpes, HPV, and human immunodeficiency virus (HIV). Herpes, HIV, and HPV are viral illnesses that have no cure. They can result in disability, cancer, and death.  Lisa should be screened for sexually transmitted illnesses (STIs) including gonorrhea and chlamydia if:  Lisa are sexually active and are younger than 24 years.  Lisa are older than 24 years and your health care provider tells Lisa that Lisa are at risk for this type of infection.  Your sexual activity has changed since Lisa were last screened and Lisa are at an increased risk for chlamydia or gonorrhea. Ask your health care provider if Lisa are at risk.  If Lisa are at risk of being infected with HIV, it is recommended that Lisa take a prescription medicine daily to  prevent HIV infection. This is called preexposure prophylaxis (PrEP). Lisa are considered at risk if:  Lisa are a heterosexual woman, are sexually active, and are at increased risk for HIV infection.  Lisa take drugs by injection.  Lisa are sexually active with a partner who has HIV.  Talk with your health care provider about whether Lisa are at high risk of being infected with HIV. If Lisa choose to begin PrEP, Lisa should first be tested for HIV. Lisa should then be tested every 3 months for as long as Lisa are taking PrEP.  Osteoporosis is a disease in which the bones lose  minerals and strength with aging. This can result in serious bone fractures or breaks. The risk of osteoporosis can be identified using a bone density scan. Women ages 2 years and over and women at risk for fractures or osteoporosis should discuss screening with their health care providers. Ask your health care provider whether Lisa should take a calcium supplement or vitamin D to reduce the rate of osteoporosis.  Menopause can be associated with physical symptoms and risks. Hormone replacement therapy is available to decrease symptoms and risks. Lisa should talk to your health care provider about whether hormone replacement therapy is right for Lisa.  Use sunscreen. Apply sunscreen liberally and repeatedly throughout the day. Lisa should seek shade when your shadow is shorter than Lisa. Protect yourself by wearing long sleeves, pants, a wide-brimmed hat, and sunglasses year round, whenever Lisa are outdoors.  Once a month, do a whole body skin exam, using a mirror to look at the skin on your back. Tell your health care provider of new moles, moles that have irregular borders, moles that are larger than a pencil eraser, or moles that have changed in shape or color.  Stay current with required vaccines (immunizations).  Influenza vaccine. All adults should be immunized every year.  Tetanus, diphtheria, and acellular pertussis (Td, Tdap)  vaccine. Pregnant women should receive 1 dose of Tdap vaccine during each pregnancy. The dose should be obtained regardless of the length of time since the last dose. Immunization is preferred during the 27th-36th week of gestation. An adult who has not previously received Tdap or who does not know her vaccine status should receive 1 dose of Tdap. This initial dose should be followed by tetanus and diphtheria toxoids (Td) booster doses every 10 years. Adults with an unknown or incomplete history of completing a 3-dose immunization series with Td-containing vaccines should begin or complete a primary immunization series including a Tdap dose. Adults should receive a Td booster every 10 years.  Varicella vaccine. An adult without evidence of immunity to varicella should receive 2 doses or a second dose if she has previously received 1 dose. Pregnant females who do not have evidence of immunity should receive the first dose after pregnancy. This first dose should be obtained before leaving the health care facility. The second dose should be obtained 4-8 weeks after the first dose.  Human papillomavirus (HPV) vaccine. Females aged 13-26 years who have not received the vaccine previously should obtain the 3-dose series. The vaccine is not recommended for use in pregnant females. However, pregnancy testing is not needed before receiving a dose. If a female is found to be pregnant after receiving a dose, no treatment is needed. In that case, the remaining doses should be delayed until after the pregnancy. Immunization is recommended for any person with an immunocompromised condition through the age of 31 years if she did not get any or all doses earlier. During the 3-dose series, the second dose should be obtained 4-8 weeks after the first dose. The third dose should be obtained 24 weeks after the first dose and 16 weeks after the second dose.  Zoster vaccine. One dose is recommended for adults aged 16 years or older  unless certain conditions are present.  Measles, mumps, and rubella (MMR) vaccine. Adults born before [birth year] generally are considered immune to measles and mumps. Adults born in [birth year] or later should have 1 or more doses of MMR vaccine unless there is a contraindication to the vaccine or there is laboratory evidence  of immunity to each of the three diseases. A routine second dose of MMR vaccine should be obtained at least 28 days after the first dose for students attending postsecondary schools, health care workers, or international travelers. People who received inactivated measles vaccine or an unknown type of measles vaccine during 1963-1967 should receive 2 doses of MMR vaccine. People who received inactivated mumps vaccine or an unknown type of mumps vaccine before 1979 and are at high risk for mumps infection should consider immunization with 2 doses of MMR vaccine. For females of childbearing age, rubella immunity should be determined. If there is no evidence of immunity, females who are not pregnant should be vaccinated. If there is no evidence of immunity, females who are pregnant should delay immunization until after pregnancy. Unvaccinated health care workers born before [birth year] who lack laboratory evidence of measles, mumps, or rubella immunity or laboratory confirmation of disease should consider measles and mumps immunization with 2 doses of MMR vaccine or rubella immunization with 1 dose of MMR vaccine.  Pneumococcal 13-valent conjugate (PCV13) vaccine. When indicated, a person who is uncertain of her immunization history and has no record of immunization should receive the PCV13 vaccine. An adult aged 19 years or older who has certain medical conditions and has not been previously immunized should receive 1 dose of PCV13 vaccine. This PCV13 should be followed with a dose of pneumococcal polysaccharide (PPSV23) vaccine. The PPSV23 vaccine dose should be obtained at least 8 weeks after the dose of PCV13  vaccine. An adult aged 31 years or older who has certain medical conditions and previously received 1 or more doses of PPSV23 vaccine should receive 1 dose of PCV13. The PCV13 vaccine dose should be obtained 1 or more years after the last PPSV23 vaccine dose.  Pneumococcal polysaccharide (PPSV23) vaccine. When PCV13 is also indicated, PCV13 should be obtained first. All adults aged 92 years and older should be immunized. An adult younger than age 67 years who has certain medical conditions should be immunized. Any person who resides in a nursing home or long-term care facility should be immunized. An adult smoker should be immunized. People with an immunocompromised condition and certain other conditions should receive both PCV13 and PPSV23 vaccines. People with human immunodeficiency virus (HIV) infection should be immunized as soon as possible after diagnosis. Immunization during chemotherapy or radiation therapy should be avoided. Routine use of PPSV23 vaccine is not recommended for American Indians, [place] Natives, or people younger than 65 years unless there are medical conditions that require PPSV23 vaccine. When indicated, people who have unknown immunization and have no record of immunization should receive PPSV23 vaccine. One-time revaccination 5 years after the first dose of PPSV23 is recommended for people aged 19-64 years who have chronic kidney failure, nephrotic syndrome, asplenia, or immunocompromised conditions. People who received 1-2 doses of PPSV23 before age 27 years should receive another dose of PPSV23 vaccine at age 51 years or later if at least 5 years have passed since the previous dose. Doses of PPSV23 are not needed for people immunized with PPSV23 at or after age 35 years.  Meningococcal vaccine. Adults with asplenia or persistent complement component deficiencies should receive 2 doses of quadrivalent meningococcal conjugate (MenACWY-D) vaccine. The doses should be obtained at least  2 months apart. Microbiologists working with certain meningococcal bacteria, [place] recruits, people at risk during an outbreak, and people who travel to or live in countries with a high rate of meningitis should be immunized. A Market researcher  up through age 32 years who is living in a residence hall should receive a dose if she did not receive a dose on or after her 16th birthday. Adults who have certain high-risk conditions should receive one or more doses of vaccine.  Hepatitis A vaccine. Adults who wish to be protected from this disease, have certain high-risk conditions, work with hepatitis A-infected animals, work in hepatitis A research labs, or travel to or work in countries with a high rate of hepatitis A should be immunized. Adults who were previously unvaccinated and who anticipate close contact with an international adoptee during the first 60 days after arrival in the [place] from a country with a high rate of hepatitis A should be immunized.  Hepatitis B vaccine. Adults who wish to be protected from this disease, have certain high-risk conditions, Lisa [name] be exposed to blood or other infectious body fluids, are household contacts or sex partners of hepatitis B positive people, are clients or workers in certain care facilities, or travel to or work in countries with a high rate of hepatitis B should be immunized.  Haemophilus influenzae type b (Hib) vaccine. A previously unvaccinated person with asplenia or sickle cell disease or having a scheduled splenectomy should receive 1 dose of Hib vaccine. Regardless of previous immunization, a recipient of a hematopoietic stem cell transplant should receive a 3-dose series 6-12 months after her successful transplant. Hib vaccine is not recommended for adults with HIV infection. Preventive Services / Frequency Ages 13 to 48 years  Blood pressure check.** / Every 1 to 2 years.  Lipid and cholesterol check.** / Every 5 years beginning  at age 35.  Clinical breast exam.** / Every 3 years for women in their 72s and 47s.  BRCA-related cancer risk assessment.** / For women who have family members with a BRCA-related cancer (breast, ovarian, tubal, or peritoneal cancers).  Pap test.** / Every 2 years from ages 68 through 9. Every 3 years starting at age 66 through age 26 or 52 with a history of 3 consecutive normal Pap tests.  HPV screening.** / Every 3 years from ages 19 through ages 2 to 45 with a history of 3 consecutive normal Pap tests.  Hepatitis C blood test.** / For any individual with known risks for hepatitis C.  Skin self-exam. / Monthly.  Influenza vaccine. / Every year.  Tetanus, diphtheria, and acellular pertussis (Tdap, Td) vaccine.** / Consult your health care provider. Pregnant women should receive 1 dose of Tdap vaccine during each pregnancy. 1 dose of Td every 10 years.  Varicella vaccine.** / Consult your health care provider. Pregnant females who do not have evidence of immunity should receive the first dose after pregnancy.  HPV vaccine. / 3 doses over 6 months, if 98 and younger. The vaccine is not recommended for use in pregnant females. However, pregnancy testing is not needed before receiving a dose.  Measles, mumps, rubella (MMR) vaccine.** / Lisa need at least 1 dose of MMR if Lisa were born in 1957 or later. Lisa Lisa [name] also need a 2nd dose. For females of childbearing age, rubella immunity should be determined. If there is no evidence of immunity, females who are not pregnant should be vaccinated. If there is no evidence of immunity, females who are pregnant should delay immunization until after pregnancy.  Pneumococcal 13-valent conjugate (PCV13) vaccine.** / Consult your health care provider.  Pneumococcal polysaccharide (PPSV23) vaccine.** / 1 to 2 doses if Lisa smoke cigarettes or if Lisa have certain conditions.  Meningococcal vaccine.** / 1 dose if Lisa are age 69 to 74 years and a Occupational psychologist living in a residence hall, or have one of several medical conditions, Lisa need to get vaccinated against meningococcal disease. Lisa Lisa [name] also need additional booster doses.  Hepatitis A vaccine.** / Consult your health care provider.  Hepatitis B vaccine.** / Consult your health care provider.  Haemophilus influenzae type b (Hib) vaccine.** / Consult your health care provider. Ages 59 to 56 years  Blood pressure check.** / Every 1 to 2 years.  Lipid and cholesterol check.** / Every 5 years beginning at age 45 years.  Lung cancer screening. / Every year if Lisa are aged 75-80 years and have a 30-pack-year history of smoking and currently smoke or have quit within the past 15 years. Yearly screening is stopped once Lisa have quit smoking for at least 15 years or develop a health problem that would prevent Lisa from having lung cancer treatment.  Clinical breast exam.** / Every year after age 1 years.  BRCA-related cancer risk assessment.** / For women who have family members with a BRCA-related cancer (breast, ovarian, tubal, or peritoneal cancers).  Mammogram.** / Every year beginning at age 11 years and continuing for as long as Lisa are in good health. Consult with your health care provider.  Pap test.** / Every 3 years starting at age 35 years through age 88 or 59 years with a history of 3 consecutive normal Pap tests.  HPV screening.** / Every 3 years from ages 63 years through ages 77 to 42 years with a history of 3 consecutive normal Pap tests.  Fecal occult blood test (FOBT) of stool. / Every year beginning at age 83 years and continuing until age 13 years. Lisa Lisa [name] not need to do this test if Lisa get a colonoscopy every 10 years.  Flexible sigmoidoscopy or colonoscopy.** / Every 5 years for a flexible sigmoidoscopy or every 10 years for a colonoscopy beginning at age 64 years and continuing until age 40 years.  Hepatitis C blood test.** / For all people born from [birth year]  through 1965 and any individual with known risks for hepatitis C.  Skin self-exam. / Monthly.  Influenza vaccine. / Every year.  Tetanus, diphtheria, and acellular pertussis (Tdap/Td) vaccine.** / Consult your health care provider. Pregnant women should receive 1 dose of Tdap vaccine during each pregnancy. 1 dose of Td every 10 years.  Varicella vaccine.** / Consult your health care provider. Pregnant females who do not have evidence of immunity should receive the first dose after pregnancy.  Zoster vaccine.** / 1 dose for adults aged 81 years or older.  Measles, mumps, rubella (MMR) vaccine.** / Lisa need at least 1 dose of MMR if Lisa were born in 1957 or later. Lisa Lisa [name] also need a 2nd dose. For females of childbearing age, rubella immunity should be determined. If there is no evidence of immunity, females who are not pregnant should be vaccinated. If there is no evidence of immunity, females who are pregnant should delay immunization until after pregnancy.  Pneumococcal 13-valent conjugate (PCV13) vaccine.** / Consult your health care provider.  Pneumococcal polysaccharide (PPSV23) vaccine.** / 1 to 2 doses if Lisa smoke cigarettes or if Lisa have certain conditions.  Meningococcal vaccine.** / Consult your health care provider.  Hepatitis A vaccine.** / Consult your health care provider.  Hepatitis B vaccine.** / Consult your health care provider.  Haemophilus influenzae type b (Hib) vaccine.** / Consult your health care  provider. Ages 70 years and over  Blood pressure check.** / Every 1 to 2 years.  Lipid and cholesterol check.** / Every 5 years beginning at age 68 years.  Lung cancer screening. / Every year if Lisa are aged 46-80 years and have a 30-pack-year history of smoking and currently smoke or have quit within the past 15 years. Yearly screening is stopped once Lisa have quit smoking for at least 15 years or develop a health problem that would prevent Lisa from having lung cancer  treatment.  Clinical breast exam.** / Every year after age 3 years.  BRCA-related cancer risk assessment.** / For women who have family members with a BRCA-related cancer (breast, ovarian, tubal, or peritoneal cancers).  Mammogram.** / Every year beginning at age 25 years and continuing for as long as Lisa are in good health. Consult with your health care provider.  Pap test.** / Every 3 years starting at age 61 years through age 11 or 17 years with 3 consecutive normal Pap tests. Testing can be stopped between 65 and 70 years with 3 consecutive normal Pap tests and no abnormal Pap or HPV tests in the past 10 years.  HPV screening.** / Every 3 years from ages 26 years through ages 46 or 53 years with a history of 3 consecutive normal Pap tests. Testing can be stopped between 65 and 70 years with 3 consecutive normal Pap tests and no abnormal Pap or HPV tests in the past 10 years.  Fecal occult blood test (FOBT) of stool. / Every year beginning at age 33 years and continuing until age 65 years. Lisa [name] not need to do this test if Lisa get a colonoscopy every 10 years.  Flexible sigmoidoscopy or colonoscopy.** / Every 5 years for a flexible sigmoidoscopy or every 10 years for a colonoscopy beginning at age 30 years and continuing until age 51 years.  Hepatitis C blood test.** / For all people born from [birth year] through 1965 and any individual with known risks for hepatitis C.  Osteoporosis screening.** / A one-time screening for women ages 61 years and over and women at risk for fractures or osteoporosis.  Skin self-exam. / Monthly.  Influenza vaccine. / Every year.  Tetanus, diphtheria, and acellular pertussis (Tdap/Td) vaccine.** / 1 dose of Td every 10 years.  Varicella vaccine.** / Consult your health care provider.  Zoster vaccine.** / 1 dose for adults aged 1 years or older.  Pneumococcal 13-valent conjugate (PCV13) vaccine.** / Consult your health care provider.  Pneumococcal  polysaccharide (PPSV23) vaccine.** / 1 dose for all adults aged 48 years and older.  Meningococcal vaccine.** / Consult your health care provider.  Hepatitis A vaccine.** / Consult your health care provider.  Hepatitis B vaccine.** / Consult your health care provider.  Haemophilus influenzae type b (Hib) vaccine.** / Consult your health care provider. ** Family history and personal history of risk and conditions [place] change your health care provider's recommendations. Document Released: 10/30/2001 Document Revised: 01/18/2014 Document Reviewed: 01/29/2011 [facility] Patient Information 2015 [place], [place]. This information is not intended to replace advice given to Lisa by your health care provider. Make sure Lisa discuss any questions Lisa have with your health care provider.

## 2014-11-02 NOTE — Progress Notes (Signed)
   Subjective:    HPI  The patient is here for a wellness exam. The patient has been doing well overall without major physical or psychological issues going on lately.   The patient has been doing well overall without major physical or psychological issues going on lately.  The patient presents for a follow-up of  chronic hypertension, chronic dyslipidemia and elev glu controlled with diet some   Review of Systems  Constitutional: Negative for appetite change.  HENT: Negative for sneezing.   Respiratory: Negative for cough and choking.   Gastrointestinal: Negative for anal bleeding.  Genitourinary: Negative for decreased urine volume.  Musculoskeletal: Negative for back pain.  Skin: Negative for color change.  Psychiatric/Behavioral: Negative for dysphoric mood.  Occ LLE pain     Wt Readings from Last 3 Encounters:  11/02/14 127 lb (57.607 kg)  10/06/14 127 lb (57.607 kg)  07/28/14 129 lb (58.514 kg)   BP Readings from Last 3 Encounters:  11/02/14 138/62  10/06/14 144/72  07/28/14 124/78     Objective:   Physical Exam  Constitutional: She appears well-developed and well-nourished. No distress.  HENT:  Head: Normocephalic.  Right Ear: External ear normal.  Left Ear: External ear normal.  Nose: Nose normal.  Mouth/Throat: Oropharynx is clear and moist.  Eyes: Conjunctivae are normal. Pupils are equal, round, and reactive to light. Right eye exhibits no discharge. Left eye exhibits no discharge.  Neck: Normal range of motion. Neck supple. No JVD present. No tracheal deviation present. No thyromegaly present.  Cardiovascular: Normal rate, regular rhythm and normal heart sounds.   Pulmonary/Chest: No stridor. No respiratory distress. She has no wheezes.  Abdominal: Soft. Bowel sounds are normal. She exhibits no distension and no mass. There is no tenderness. There is no rebound and no guarding.  Musculoskeletal: She exhibits no edema or tenderness.  Lymphadenopathy:   She has no cervical adenopathy.  Neurological: She displays normal reflexes. No cranial nerve deficit. She exhibits normal muscle tone. Coordination normal.  Skin: No rash noted. No erythema.  Psychiatric: She has a normal mood and affect. Her behavior is normal. Judgment and thought content normal.       Lab Results  Component Value Date   WBC 5.6 04/27/2014   HGB 14.6 04/27/2014   HCT 43.3 04/27/2014   PLT 290.0 04/27/2014   CHOL 289* 04/27/2014   TRIG 231.0* 04/27/2014   HDL 54.50 04/27/2014   LDLDIRECT 214.3 04/27/2014   ALT 27 10/27/2013   AST 21 10/27/2013   NA 138 04/27/2014   K 3.7 04/27/2014   CL 102 04/27/2014   CREATININE 0.6 04/27/2014   BUN 12 04/27/2014   CO2 24 04/27/2014   TSH 0.62 07/09/2012   HGBA1C 5.9 04/27/2014      Assessment & Plan:

## 2014-11-02 NOTE — Progress Notes (Signed)
Pre visit review using our clinic review tool, if applicable. No additional management support is needed unless otherwise documented below in the visit note. 

## 2014-12-01 ENCOUNTER — Ambulatory Visit: Payer: Medicare Other | Admitting: Family Medicine

## 2015-03-02 ENCOUNTER — Ambulatory Visit: Payer: Medicare Other | Admitting: Family Medicine

## 2015-04-28 DIAGNOSIS — H35343 Macular cyst, hole, or pseudohole, bilateral: Secondary | ICD-10-CM | POA: Diagnosis not present

## 2015-04-28 DIAGNOSIS — E119 Type 2 diabetes mellitus without complications: Secondary | ICD-10-CM | POA: Diagnosis not present

## 2015-04-28 DIAGNOSIS — Z961 Presence of intraocular lens: Secondary | ICD-10-CM | POA: Diagnosis not present

## 2015-05-02 ENCOUNTER — Other Ambulatory Visit (INDEPENDENT_AMBULATORY_CARE_PROVIDER_SITE_OTHER): Payer: Medicare Other

## 2015-05-02 DIAGNOSIS — Z0000 Encounter for general adult medical examination without abnormal findings: Secondary | ICD-10-CM | POA: Diagnosis not present

## 2015-05-02 DIAGNOSIS — E785 Hyperlipidemia, unspecified: Secondary | ICD-10-CM | POA: Diagnosis not present

## 2015-05-02 DIAGNOSIS — E559 Vitamin D deficiency, unspecified: Secondary | ICD-10-CM | POA: Diagnosis not present

## 2015-05-02 LAB — URINALYSIS
BILIRUBIN URINE: NEGATIVE
Hgb urine dipstick: NEGATIVE
KETONES UR: NEGATIVE
Leukocytes, UA: NEGATIVE
Nitrite: NEGATIVE
Specific Gravity, Urine: <=1.005 — AB (ref 1.000–1.030)
Total Protein, Urine: NEGATIVE
URINE GLUCOSE: NEGATIVE
UROBILINOGEN UA: 0.2 (ref 0.0–1.0)
pH: 6.5 (ref 5.0–8.0)

## 2015-05-02 LAB — LIPID PANEL
Cholesterol: 258 mg/dL — ABNORMAL HIGH (ref 0–200)
HDL: 53.7 mg/dL (ref >39.00)
LDL Cholesterol: 165 mg/dL — ABNORMAL HIGH (ref 0–99)
NONHDL: 204.04
TRIGLYCERIDES: 194 mg/dL — AB (ref 0.0–149.0)
Total CHOL/HDL Ratio: 5
VLDL: 38.8 mg/dL (ref 0.0–40.0)

## 2015-05-02 LAB — HEPATIC FUNCTION PANEL
ALT: 19 U/L (ref 0–35)
AST: 18 U/L (ref 0–37)
Albumin: 3.9 g/dL (ref 3.5–5.2)
Alkaline Phosphatase: 64 U/L (ref 39–117)
Bilirubin, Direct: 0.1 mg/dL (ref 0.0–0.3)
TOTAL PROTEIN: 6.7 g/dL (ref 6.0–8.3)
Total Bilirubin: 0.4 mg/dL (ref 0.2–1.2)

## 2015-05-02 LAB — CBC WITH DIFFERENTIAL/PLATELET
BASOS ABS: 0 10*3/uL (ref 0.0–0.1)
Basophils Relative: 0.8 % (ref 0.0–3.0)
Eosinophils Absolute: 0.1 10*3/uL (ref 0.0–0.7)
Eosinophils Relative: 2.4 % (ref 0.0–5.0)
HEMATOCRIT: 42.2 % (ref 36.0–46.0)
Hemoglobin: 14.2 g/dL (ref 12.0–15.0)
LYMPHS PCT: 35.2 % (ref 12.0–46.0)
Lymphs Abs: 2 10*3/uL (ref 0.7–4.0)
MCHC: 33.7 g/dL (ref 30.0–36.0)
MCV: 87.1 fl (ref 78.0–100.0)
MONOS PCT: 8.7 % (ref 3.0–12.0)
Monocytes Absolute: 0.5 10*3/uL (ref 0.1–1.0)
Neutro Abs: 3 10*3/uL (ref 1.4–7.7)
Neutrophils Relative %: 52.9 % (ref 43.0–77.0)
Platelets: 266 10*3/uL (ref 150.0–400.0)
RBC: 4.84 Mil/uL (ref 3.87–5.11)
RDW: 13.1 % (ref 11.5–15.5)
WBC: 5.6 10*3/uL (ref 4.0–10.5)

## 2015-05-02 LAB — BASIC METABOLIC PANEL
BUN: 13 mg/dL (ref 6–23)
CHLORIDE: 104 meq/L (ref 96–112)
CO2: 28 mEq/L (ref 19–32)
CREATININE: 0.67 mg/dL (ref 0.40–1.20)
Calcium: 9.4 mg/dL (ref 8.4–10.5)
GFR: 92.69 mL/min (ref >60.00)
Glucose, Bld: 105 mg/dL — ABNORMAL HIGH (ref 70–99)
POTASSIUM: 4.8 meq/L (ref 3.5–5.1)
Sodium: 139 mEq/L (ref 135–145)

## 2015-05-02 LAB — TSH: TSH: 0.77 u[IU]/mL (ref 0.35–4.50)

## 2015-05-03 ENCOUNTER — Ambulatory Visit (INDEPENDENT_AMBULATORY_CARE_PROVIDER_SITE_OTHER): Payer: Medicare Other | Admitting: Internal Medicine

## 2015-05-03 ENCOUNTER — Encounter: Payer: Self-pay | Admitting: Internal Medicine

## 2015-05-03 VITALS — BP 140/70 | HR 71 | Wt 130.0 lb

## 2015-05-03 DIAGNOSIS — M5416 Radiculopathy, lumbar region: Secondary | ICD-10-CM | POA: Diagnosis not present

## 2015-05-03 DIAGNOSIS — E785 Hyperlipidemia, unspecified: Secondary | ICD-10-CM | POA: Diagnosis not present

## 2015-05-03 DIAGNOSIS — Z0000 Encounter for general adult medical examination without abnormal findings: Secondary | ICD-10-CM | POA: Diagnosis not present

## 2015-05-03 DIAGNOSIS — E559 Vitamin D deficiency, unspecified: Secondary | ICD-10-CM

## 2015-05-03 DIAGNOSIS — I10 Essential (primary) hypertension: Secondary | ICD-10-CM | POA: Diagnosis not present

## 2015-05-03 DIAGNOSIS — R296 Repeated falls: Secondary | ICD-10-CM

## 2015-05-03 DIAGNOSIS — R739 Hyperglycemia, unspecified: Secondary | ICD-10-CM

## 2015-05-03 MED ORDER — LISINOPRIL-HYDROCHLOROTHIAZIDE 10-12.5 MG PO TABS: 1.0000 | ORAL_TABLET | Freq: Every day | ORAL | Status: DC

## 2015-05-03 NOTE — Progress Notes (Signed)
Pre visit review using our clinic review tool, if applicable. No additional management support is needed unless otherwise documented below in the visit note. 

## 2015-05-03 NOTE — Patient Instructions (Signed)
Preventive Care for Adults A healthy lifestyle and preventive care can promote health and wellness. Preventive health guidelines for women include the following key practices.  A routine yearly physical is a good way to check with your health care provider about your health and preventive screening. It is a chance to share any concerns and updates on your health and to receive a thorough exam.  Visit your dentist for a routine exam and preventive care every 6 months. Brush your teeth twice a day and floss once a day. Good oral hygiene prevents tooth decay and gum disease.  The frequency of eye exams is based on your age, health, family medical history, use of contact lenses, and other factors. Follow your health care provider's recommendations for frequency of eye exams.  Eat a healthy diet. Foods like vegetables, fruits, whole grains, low-fat dairy products, and lean protein foods contain the nutrients you need without too many calories. Decrease your intake of foods high in solid fats, added sugars, and salt. Eat the right amount of calories for you.Get information about a proper diet from your health care provider, if necessary.  Regular physical exercise is one of the most important things you can do for your health. Most adults should get at least 150 minutes of moderate-intensity exercise (any activity that increases your heart rate and causes you to sweat) each week. In addition, most adults need muscle-strengthening exercises on 2 or more days a week.  Maintain a healthy weight. The body mass index (BMI) is a screening tool to identify possible weight problems. It provides an estimate of body fat based on height and weight. Your health care provider can find your BMI and can help you achieve or maintain a healthy weight.For adults 20 years and older:  A BMI below 18.5 is considered underweight.  A BMI of 18.5 to 24.9 is normal.  A BMI of 25 to 29.9 is considered overweight.  A BMI of  30 and above is considered obese.  Maintain normal blood lipids and cholesterol levels by exercising and minimizing your intake of saturated fat. Eat a balanced diet with plenty of fruit and vegetables. Blood tests for lipids and cholesterol should begin at age 76 and be repeated every 5 years. If your lipid or cholesterol levels are high, you are over 50, or you are at high risk for heart disease, you Shoe need your cholesterol levels checked more frequently.Ongoing high lipid and cholesterol levels should be treated with medicines if diet and exercise are not working.  If you smoke, find out from your health care provider how to quit. If you do not use tobacco, do not start.  Lung cancer screening is recommended for adults aged 22-80 years who are at high risk for developing lung cancer because of a history of smoking. A yearly low-dose CT scan of the lungs is recommended for people who have at least a 30-pack-year history of smoking and are a current smoker or have quit within the past 15 years. A pack year of smoking is smoking an average of 1 pack of cigarettes a day for 1 year (for example: 1 pack a day for 30 years or 2 packs a day for 15 years). Yearly screening should continue until the smoker has stopped smoking for at least 15 years. Yearly screening should be stopped for people who develop a health problem that would prevent them from having lung cancer treatment.  If you are pregnant, do not drink alcohol. If you are breastfeeding,  be very cautious about drinking alcohol. If you are not pregnant and choose to drink alcohol, do not have more than 1 drink per day. One drink is considered to be 12 ounces (355 mL) of beer, 5 ounces (148 mL) of wine, or 1.5 ounces (44 mL) of liquor.  Avoid use of street drugs. Do not share needles with anyone. Ask for help if you need support or instructions about stopping the use of drugs.  High blood pressure causes heart disease and increases the risk of  stroke. Your blood pressure should be checked at least every 1 to 2 years. Ongoing high blood pressure should be treated with medicines if weight loss and exercise do not work.  If you are 3-86 years old, ask your health care provider if you should take aspirin to prevent strokes.  Diabetes screening involves taking a blood sample to check your fasting blood sugar level. This should be done once every 3 years, after age 67, if you are within normal weight and without risk factors for diabetes. Testing should be considered at a younger age or be carried out more frequently if you are overweight and have at least 1 risk factor for diabetes.  Breast cancer screening is essential preventive care for women. You should practice "breast self-awareness." This means understanding the normal appearance and feel of your breasts and Wubben include breast self-examination. Any changes detected, no matter how small, should be reported to a health care provider. Women in their 8s and 30s should have a clinical breast exam (CBE) by a health care provider as part of a regular health exam every 1 to 3 years. After age 70, women should have a CBE every year. Starting at age 25, women should consider having a mammogram (breast X-ray test) every year. Women who have a family history of breast cancer should talk to their health care provider about genetic screening. Women at a high risk of breast cancer should talk to their health care providers about having an MRI and a mammogram every year.  Breast cancer gene (BRCA)-related cancer risk assessment is recommended for women who have family members with BRCA-related cancers. BRCA-related cancers include breast, ovarian, tubal, and peritoneal cancers. Having family members with these cancers Kranz be associated with an increased risk for harmful changes (mutations) in the breast cancer genes BRCA1 and BRCA2. Results of the assessment will determine the need for genetic counseling and  BRCA1 and BRCA2 testing.  Routine pelvic exams to screen for cancer are no longer recommended for nonpregnant women who are considered low risk for cancer of the pelvic organs (ovaries, uterus, and vagina) and who do not have symptoms. Ask your health care provider if a screening pelvic exam is right for you.  If you have had past treatment for cervical cancer or a condition that could lead to cancer, you need Pap tests and screening for cancer for at least 20 years after your treatment. If Pap tests have been discontinued, your risk factors (such as having a new sexual partner) need to be reassessed to determine if screening should be resumed. Some women have medical problems that increase the chance of getting cervical cancer. In these cases, your health care provider Lokken recommend more frequent screening and Pap tests.  The HPV test is an additional test that Dettman be used for cervical cancer screening. The HPV test looks for the virus that can cause the cell changes on the cervix. The cells collected during the Pap test can be  tested for HPV. The HPV test could be used to screen women aged 30 years and older, and should be used in women of any age who have unclear Pap test results. After the age of 30, women should have HPV testing at the same frequency as a Pap test.  Colorectal cancer can be detected and often prevented. Most routine colorectal cancer screening begins at the age of 50 years and continues through age 75 years. However, your health care provider Dickard recommend screening at an earlier age if you have risk factors for colon cancer. On a yearly basis, your health care provider Winget provide home test kits to check for hidden blood in the stool. Use of a small camera at the end of a tube, to directly examine the colon (sigmoidoscopy or colonoscopy), can detect the earliest forms of colorectal cancer. Talk to your health care provider about this at age 50, when routine screening begins. Direct  exam of the colon should be repeated every 5-10 years through age 75 years, unless early forms of pre-cancerous polyps or small growths are found.  People who are at an increased risk for hepatitis B should be screened for this virus. You are considered at high risk for hepatitis B if:  You were born in a country where hepatitis B occurs often. Talk with your health care provider about which countries are considered high risk.  Your parents were born in a high-risk country and you have not received a shot to protect against hepatitis B (hepatitis B vaccine).  You have HIV or AIDS.  You use needles to inject street drugs.  You live with, or have sex with, someone who has hepatitis B.  You get hemodialysis treatment.  You take certain medicines for conditions like cancer, organ transplantation, and autoimmune conditions.  Hepatitis C blood testing is recommended for all people born from 1945 through 1965 and any individual with known risks for hepatitis C.  Practice safe sex. Use condoms and avoid high-risk sexual practices to reduce the spread of sexually transmitted infections (STIs). STIs include gonorrhea, chlamydia, syphilis, trichomonas, herpes, HPV, and human immunodeficiency virus (HIV). Herpes, HIV, and HPV are viral illnesses that have no cure. They can result in disability, cancer, and death.  You should be screened for sexually transmitted illnesses (STIs) including gonorrhea and chlamydia if:  You are sexually active and are younger than 24 years.  You are older than 24 years and your health care provider tells you that you are at risk for this type of infection.  Your sexual activity has changed since you were last screened and you are at an increased risk for chlamydia or gonorrhea. Ask your health care provider if you are at risk.  If you are at risk of being infected with HIV, it is recommended that you take a prescription medicine daily to prevent HIV infection. This is  called preexposure prophylaxis (PrEP). You are considered at risk if:  You are a heterosexual woman, are sexually active, and are at increased risk for HIV infection.  You take drugs by injection.  You are sexually active with a partner who has HIV.  Talk with your health care provider about whether you are at high risk of being infected with HIV. If you choose to begin PrEP, you should first be tested for HIV. You should then be tested every 3 months for as long as you are taking PrEP.  Osteoporosis is a disease in which the bones lose minerals and strength   with aging. This can result in serious bone fractures or breaks. The risk of osteoporosis can be identified using a bone density scan. Women ages 65 years and over and women at risk for fractures or osteoporosis should discuss screening with their health care providers. Ask your health care provider whether you should take a calcium supplement or vitamin D to reduce the rate of osteoporosis.  Menopause can be associated with physical symptoms and risks. Hormone replacement therapy is available to decrease symptoms and risks. You should talk to your health care provider about whether hormone replacement therapy is right for you.  Use sunscreen. Apply sunscreen liberally and repeatedly throughout the day. You should seek shade when your shadow is shorter than you. Protect yourself by wearing long sleeves, pants, a wide-brimmed hat, and sunglasses year round, whenever you are outdoors.  Once a month, do a whole body skin exam, using a mirror to look at the skin on your back. Tell your health care provider of new moles, moles that have irregular borders, moles that are larger than a pencil eraser, or moles that have changed in shape or color.  Stay current with required vaccines (immunizations).  Influenza vaccine. All adults should be immunized every year.  Tetanus, diphtheria, and acellular pertussis (Td, Tdap) vaccine. Pregnant women should  receive 1 dose of Tdap vaccine during each pregnancy. The dose should be obtained regardless of the length of time since the last dose. Immunization is preferred during the 27th-36th week of gestation. An adult who has not previously received Tdap or who does not know her vaccine status should receive 1 dose of Tdap. This initial dose should be followed by tetanus and diphtheria toxoids (Td) booster doses every 10 years. Adults with an unknown or incomplete history of completing a 3-dose immunization series with Td-containing vaccines should begin or complete a primary immunization series including a Tdap dose. Adults should receive a Td booster every 10 years.  Varicella vaccine. An adult without evidence of immunity to varicella should receive 2 doses or a second dose if she has previously received 1 dose. Pregnant females who do not have evidence of immunity should receive the first dose after pregnancy. This first dose should be obtained before leaving the health care facility. The second dose should be obtained 4-8 weeks after the first dose.  Human papillomavirus (HPV) vaccine. Females aged 13-26 years who have not received the vaccine previously should obtain the 3-dose series. The vaccine is not recommended for use in pregnant females. However, pregnancy testing is not needed before receiving a dose. If a female is found to be pregnant after receiving a dose, no treatment is needed. In that case, the remaining doses should be delayed until after the pregnancy. Immunization is recommended for any person with an immunocompromised condition through the age of 26 years if she did not get any or all doses earlier. During the 3-dose series, the second dose should be obtained 4-8 weeks after the first dose. The third dose should be obtained 24 weeks after the first dose and 16 weeks after the second dose.  Zoster vaccine. One dose is recommended for adults aged 60 years or older unless certain conditions are  present.  Measles, mumps, and rubella (MMR) vaccine. Adults born before 1957 generally are considered immune to measles and mumps. Adults born in 1957 or later should have 1 or more doses of MMR vaccine unless there is a contraindication to the vaccine or there is laboratory evidence of immunity to   each of the three diseases. A routine second dose of MMR vaccine should be obtained at least 28 days after the first dose for students attending postsecondary schools, health care workers, or international travelers. People who received inactivated measles vaccine or an unknown type of measles vaccine during 1963-1967 should receive 2 doses of MMR vaccine. People who received inactivated mumps vaccine or an unknown type of mumps vaccine before 1979 and are at high risk for mumps infection should consider immunization with 2 doses of MMR vaccine. For females of childbearing age, rubella immunity should be determined. If there is no evidence of immunity, females who are not pregnant should be vaccinated. If there is no evidence of immunity, females who are pregnant should delay immunization until after pregnancy. Unvaccinated health care workers born before 1957 who lack laboratory evidence of measles, mumps, or rubella immunity or laboratory confirmation of disease should consider measles and mumps immunization with 2 doses of MMR vaccine or rubella immunization with 1 dose of MMR vaccine.  Pneumococcal 13-valent conjugate (PCV13) vaccine. When indicated, a person who is uncertain of her immunization history and has no record of immunization should receive the PCV13 vaccine. An adult aged 19 years or older who has certain medical conditions and has not been previously immunized should receive 1 dose of PCV13 vaccine. This PCV13 should be followed with a dose of pneumococcal polysaccharide (PPSV23) vaccine. The PPSV23 vaccine dose should be obtained at least 8 weeks after the dose of PCV13 vaccine. An adult aged 19  years or older who has certain medical conditions and previously received 1 or more doses of PPSV23 vaccine should receive 1 dose of PCV13. The PCV13 vaccine dose should be obtained 1 or more years after the last PPSV23 vaccine dose.  Pneumococcal polysaccharide (PPSV23) vaccine. When PCV13 is also indicated, PCV13 should be obtained first. All adults aged 65 years and older should be immunized. An adult younger than age 65 years who has certain medical conditions should be immunized. Any person who resides in a nursing home or long-term care facility should be immunized. An adult smoker should be immunized. People with an immunocompromised condition and certain other conditions should receive both PCV13 and PPSV23 vaccines. People with human immunodeficiency virus (HIV) infection should be immunized as soon as possible after diagnosis. Immunization during chemotherapy or radiation therapy should be avoided. Routine use of PPSV23 vaccine is not recommended for American Indians, [place] Natives, or people younger than 65 years unless there are medical conditions that require PPSV23 vaccine. When indicated, people who have unknown immunization and have no record of immunization should receive PPSV23 vaccine. One-time revaccination 5 years after the first dose of PPSV23 is recommended for people aged 19-64 years who have chronic kidney failure, nephrotic syndrome, asplenia, or immunocompromised conditions. People who received 1-2 doses of PPSV23 before age 65 years should receive another dose of PPSV23 vaccine at age 65 years or later if at least 5 years have passed since the previous dose. Doses of PPSV23 are not needed for people immunized with PPSV23 at or after age 65 years.  Meningococcal vaccine. Adults with asplenia or persistent complement component deficiencies should receive 2 doses of quadrivalent meningococcal conjugate (MenACWY-D) vaccine. The doses should be obtained at least 2 months apart.  Microbiologists working with certain meningococcal bacteria, military recruits, people at risk during an outbreak, and people who travel to or live in countries with a high rate of meningitis should be immunized. A first-year college student up through age   21 years who is living in a residence hall should receive a dose if she did not receive a dose on or after her 16th birthday. Adults who have certain high-risk conditions should receive one or more doses of vaccine.  Hepatitis A vaccine. Adults who wish to be protected from this disease, have certain high-risk conditions, work with hepatitis A-infected animals, work in hepatitis A research labs, or travel to or work in countries with a high rate of hepatitis A should be immunized. Adults who were previously unvaccinated and who anticipate close contact with an international adoptee during the first 60 days after arrival in the [place] from a country with a high rate of hepatitis A should be immunized.  Hepatitis B vaccine. Adults who wish to be protected from this disease, have certain high-risk conditions, Ingle be exposed to blood or other infectious body fluids, are household contacts or sex partners of hepatitis B positive people, are clients or workers in certain care facilities, or travel to or work in countries with a high rate of hepatitis B should be immunized.  Haemophilus influenzae type b (Hib) vaccine. A previously unvaccinated person with asplenia or sickle cell disease or having a scheduled splenectomy should receive 1 dose of Hib vaccine. Regardless of previous immunization, a recipient of a hematopoietic stem cell transplant should receive a 3-dose series 6-12 months after her successful transplant. Hib vaccine is not recommended for adults with HIV infection. Preventive Services / Frequency Ages 64 to 68 years  Blood pressure check.** / Every 1 to 2 years.  Lipid and cholesterol check.** / Every 5 years beginning at age  22.  Clinical breast exam.** / Every 3 years for women in their 88s and 53s.  BRCA-related cancer risk assessment.** / For women who have family members with a BRCA-related cancer (breast, ovarian, tubal, or peritoneal cancers).  Pap test.** / Every 2 years from ages 90 through 51. Every 3 years starting at age 21 through age 56 or 3 with a history of 3 consecutive normal Pap tests.  HPV screening.** / Every 3 years from ages 24 through ages 1 to 46 with a history of 3 consecutive normal Pap tests.  Hepatitis C blood test.** / For any individual with known risks for hepatitis C.  Skin self-exam. / Monthly.  Influenza vaccine. / Every year.  Tetanus, diphtheria, and acellular pertussis (Tdap, Td) vaccine.** / Consult your health care provider. Pregnant women should receive 1 dose of Tdap vaccine during each pregnancy. 1 dose of Td every 10 years.  Varicella vaccine.** / Consult your health care provider. Pregnant females who do not have evidence of immunity should receive the first dose after pregnancy.  HPV vaccine. / 3 doses over 6 months, if 72 and younger. The vaccine is not recommended for use in pregnant females. However, pregnancy testing is not needed before receiving a dose.  Measles, mumps, rubella (MMR) vaccine.** / You need at least 1 dose of MMR if you were born in 1957 or later. You Gatley also need a 2nd dose. For females of childbearing age, rubella immunity should be determined. If there is no evidence of immunity, females who are not pregnant should be vaccinated. If there is no evidence of immunity, females who are pregnant should delay immunization until after pregnancy.  Pneumococcal 13-valent conjugate (PCV13) vaccine.** / Consult your health care provider.  Pneumococcal polysaccharide (PPSV23) vaccine.** / 1 to 2 doses if you smoke cigarettes or if you have certain conditions.  Meningococcal vaccine.** /  1 dose if you are age 19 to 21 years and a first-year college  student living in a residence hall, or have one of several medical conditions, you need to get vaccinated against meningococcal disease. You Breitenstein also need additional booster doses.  Hepatitis A vaccine.** / Consult your health care provider.  Hepatitis B vaccine.** / Consult your health care provider.  Haemophilus influenzae type b (Hib) vaccine.** / Consult your health care provider. Ages 40 to 64 years  Blood pressure check.** / Every 1 to 2 years.  Lipid and cholesterol check.** / Every 5 years beginning at age 20 years.  Lung cancer screening. / Every year if you are aged 55-80 years and have a 30-pack-year history of smoking and currently smoke or have quit within the past 15 years. Yearly screening is stopped once you have quit smoking for at least 15 years or develop a health problem that would prevent you from having lung cancer treatment.  Clinical breast exam.** / Every year after age 40 years.  BRCA-related cancer risk assessment.** / For women who have family members with a BRCA-related cancer (breast, ovarian, tubal, or peritoneal cancers).  Mammogram.** / Every year beginning at age 40 years and continuing for as long as you are in good health. Consult with your health care provider.  Pap test.** / Every 3 years starting at age 30 years through age 65 or 70 years with a history of 3 consecutive normal Pap tests.  HPV screening.** / Every 3 years from ages 30 years through ages 65 to 70 years with a history of 3 consecutive normal Pap tests.  Fecal occult blood test (FOBT) of stool. / Every year beginning at age 50 years and continuing until age 75 years. You Web not need to do this test if you get a colonoscopy every 10 years.  Flexible sigmoidoscopy or colonoscopy.** / Every 5 years for a flexible sigmoidoscopy or every 10 years for a colonoscopy beginning at age 50 years and continuing until age 75 years.  Hepatitis C blood test.** / For all people born from 1945 through  1965 and any individual with known risks for hepatitis C.  Skin self-exam. / Monthly.  Influenza vaccine. / Every year.  Tetanus, diphtheria, and acellular pertussis (Tdap/Td) vaccine.** / Consult your health care provider. Pregnant women should receive 1 dose of Tdap vaccine during each pregnancy. 1 dose of Td every 10 years.  Varicella vaccine.** / Consult your health care provider. Pregnant females who do not have evidence of immunity should receive the first dose after pregnancy.  Zoster vaccine.** / 1 dose for adults aged 60 years or older.  Measles, mumps, rubella (MMR) vaccine.** / You need at least 1 dose of MMR if you were born in 1957 or later. You Consiglio also need a 2nd dose. For females of childbearing age, rubella immunity should be determined. If there is no evidence of immunity, females who are not pregnant should be vaccinated. If there is no evidence of immunity, females who are pregnant should delay immunization until after pregnancy.  Pneumococcal 13-valent conjugate (PCV13) vaccine.** / Consult your health care provider.  Pneumococcal polysaccharide (PPSV23) vaccine.** / 1 to 2 doses if you smoke cigarettes or if you have certain conditions.  Meningococcal vaccine.** / Consult your health care provider.  Hepatitis A vaccine.** / Consult your health care provider.  Hepatitis B vaccine.** / Consult your health care provider.  Haemophilus influenzae type b (Hib) vaccine.** / Consult your health care provider. Ages 65   years and over  Blood pressure check.** / Every 1 to 2 years.  Lipid and cholesterol check.** / Every 5 years beginning at age 22 years.  Lung cancer screening. / Every year if you are aged 73-80 years and have a 30-pack-year history of smoking and currently smoke or have quit within the past 15 years. Yearly screening is stopped once you have quit smoking for at least 15 years or develop a health problem that would prevent you from having lung cancer  treatment.  Clinical breast exam.** / Every year after age 4 years.  BRCA-related cancer risk assessment.** / For women who have family members with a BRCA-related cancer (breast, ovarian, tubal, or peritoneal cancers).  Mammogram.** / Every year beginning at age 40 years and continuing for as long as you are in good health. Consult with your health care provider.  Pap test.** / Every 3 years starting at age 9 years through age 34 or 91 years with 3 consecutive normal Pap tests. Testing can be stopped between 65 and 70 years with 3 consecutive normal Pap tests and no abnormal Pap or HPV tests in the past 10 years.  HPV screening.** / Every 3 years from ages 57 years through ages 64 or 45 years with a history of 3 consecutive normal Pap tests. Testing can be stopped between 65 and 70 years with 3 consecutive normal Pap tests and no abnormal Pap or HPV tests in the past 10 years.  Fecal occult blood test (FOBT) of stool. / Every year beginning at age 15 years and continuing until age 17 years. You Hosea not need to do this test if you get a colonoscopy every 10 years.  Flexible sigmoidoscopy or colonoscopy.** / Every 5 years for a flexible sigmoidoscopy or every 10 years for a colonoscopy beginning at age 86 years and continuing until age 71 years.  Hepatitis C blood test.** / For all people born from [birth year] through 1965 and any individual with known risks for hepatitis C.  Osteoporosis screening.** / A one-time screening for women ages 83 years and over and women at risk for fractures or osteoporosis.  Skin self-exam. / Monthly.  Influenza vaccine. / Every year.  Tetanus, diphtheria, and acellular pertussis (Tdap/Td) vaccine.** / 1 dose of Td every 10 years.  Varicella vaccine.** / Consult your health care provider.  Zoster vaccine.** / 1 dose for adults aged 61 years or older.  Pneumococcal 13-valent conjugate (PCV13) vaccine.** / Consult your health care provider.  Pneumococcal  polysaccharide (PPSV23) vaccine.** / 1 dose for all adults aged 28 years and older.  Meningococcal vaccine.** / Consult your health care provider.  Hepatitis A vaccine.** / Consult your health care provider.  Hepatitis B vaccine.** / Consult your health care provider.  Haemophilus influenzae type b (Hib) vaccine.** / Consult your health care provider. ** Family history and personal history of risk and conditions Ard change your health care provider's recommendations. Document Released: 10/30/2001 Document Revised: 01/18/2014 Document Reviewed: 01/29/2011 [facility] Patient Information 2015 [place], [place]. This information is not intended to replace advice given to you by your health care provider. Make sure you discuss any questions you have with your health care provider.

## 2015-05-03 NOTE — Assessment & Plan Note (Addendum)
Here for medicare wellness/physical  Diet: heart healthy  Physical activity: not sedentary  Depression/mood screen: negative  Hearing: intact to whispered voice  Visual acuity: grossly normal, performs annual eye exam  ADLs: capable  Fall risk: low Home safety: good  Cognitive evaluation: intact to orientation, naming, recall and repetition  EOL planning: adv directives, full code/ I agree  I have personally reviewed and have noted  1. The patient's medical and social history  2. Their use of alcohol, tobacco or illicit drugs  3. Their current medications and supplements  4. The patient's functional ability including ADL's, fall risks, home safety risks and hearing or visual impairment.  5. Diet and physical activities  6. Evidence for depression or mood disorders 7. Providers roster was reviewed    Today patient counseled on age appropriate routine health concerns for screening and prevention, each reviewed and up to date or declined. Immunizations reviewed and up to date or declined. Labs ordered and reviewed. Risk factors for depression reviewed and negative. Hearing function and visual acuity are intact. ADLs screened and addressed as needed. Functional ability and level of safety reviewed and appropriate. Education, counseling and referrals performed based on assessed risks today. Patient provided with a copy of personalized plan for preventive services.   Colonoscopy due in 2-3 years Pt had a mammo GYN appt pending

## 2015-05-03 NOTE — Assessment & Plan Note (Signed)
[place]

## 2015-05-03 NOTE — Progress Notes (Signed)
Subjective:  Patient ID: Lisa [name], female    DOB: 02-23-1946  Age: 69 y.o. MRN: [id number]  CC: No chief complaint on file.   HPI   Well exam  Lisa [name] presents for HTN, dyslipidemia, OA  Outpatient Prescriptions Prior to Visit  Medication Sig Dispense Refill  . ascorbic acid (VITAMIN C) 500 MG tablet Take 500 mg by mouth daily.    . cholecalciferol (VITAMIN D) 1000 UNITS tablet Take 2,000 Units by mouth daily.     . cyanocobalamin 1000 MCG tablet Take 1,000 mcg by mouth daily.    . Glucosamine HCl 500 MG TABS Take by mouth 2 (two) times daily.     Marland Kitchen lisinopril-hydrochlorothiazide (PRINZIDE,ZESTORETIC) 10-12.5 MG per tablet Take 1 tablet by mouth daily. 90 tablet 3  . meloxicam (MOBIC) 7.5 MG tablet Take 1 tablet (7.5 mg total) by mouth daily. (Patient taking differently: Take 3.75 mg by mouth daily. ) 90 tablet 0  . Omega-3 Fatty Acids (FISH OIL) 1000 MG CAPS Take 1 capsule by mouth 2 (two) times daily. Mega Red w/krill oil    . UNABLE TO FIND 1 each daily. Med Name: Tumeric 500 mg     Facility-Administered Medications Prior to Visit  Medication Dose Route Frequency Provider Last Rate Last Dose  . methylPREDNISolone acetate (DEPO-MEDROL) injection 40 mg  40 mg Intra-articular Once Walterine Amodei V, MD        ROS Review of Systems  Constitutional: Negative for chills, activity change, appetite change, fatigue and unexpected weight change.  HENT: Negative for congestion, mouth sores and sinus pressure.   Eyes: Negative for visual disturbance.  Respiratory: Negative for cough and chest tightness.   Gastrointestinal: Negative for nausea and abdominal pain.  Genitourinary: Negative for frequency, difficulty urinating and vaginal pain.  Musculoskeletal: Positive for arthralgias. Negative for back pain and gait problem.  Skin: Negative for pallor and rash.  Neurological: Negative for dizziness, tremors, weakness, numbness and headaches.  Psychiatric/Behavioral:  Negative for confusion and sleep disturbance. The patient is not nervous/anxious.     Objective:  BP 140/70 mmHg  Pulse 71  Wt 130 lb (58.968 kg)  SpO2 97%  BP Readings from Last 3 Encounters:  05/03/15 140/70  11/02/14 138/62  10/06/14 144/72    Wt Readings from Last 3 Encounters:  05/03/15 130 lb (58.968 kg)  11/02/14 127 lb (57.607 kg)  10/06/14 127 lb (57.607 kg)    Physical Exam  Constitutional: She appears well-developed. No distress.  HENT:  Head: Normocephalic.  Right Ear: External ear normal.  Left Ear: External ear normal.  Nose: Nose normal.  Mouth/Throat: Oropharynx is clear and moist.  Eyes: Conjunctivae are normal. Pupils are equal, round, and reactive to light. Right eye exhibits no discharge. Left eye exhibits no discharge.  Neck: Normal range of motion. Neck supple. No JVD present. No tracheal deviation present. No thyromegaly present.  Cardiovascular: Normal rate, regular rhythm and normal heart sounds.   Pulmonary/Chest: No stridor. No respiratory distress. She has no wheezes.  Abdominal: Soft. Bowel sounds are normal. She exhibits no distension and no mass. There is no tenderness. There is no rebound and no guarding.  Musculoskeletal: She exhibits no edema or tenderness.  Lymphadenopathy:    She has no cervical adenopathy.  Neurological: She displays normal reflexes. No cranial nerve deficit. She exhibits normal muscle tone. Coordination normal.  Skin: No rash noted. No erythema.  Psychiatric: She has a normal mood and affect. Her behavior is normal. Judgment and  thought content normal.    Lab Results  Component Value Date   WBC 5.6 05/02/2015   HGB 14.2 05/02/2015   HCT 42.2 05/02/2015   PLT 266.0 05/02/2015   GLUCOSE 105* 05/02/2015   CHOL 258* 05/02/2015   TRIG 194.0* 05/02/2015   HDL 53.70 05/02/2015   LDLDIRECT 214.3 04/27/2014   LDLCALC 165* 05/02/2015   ALT 19 05/02/2015   AST 18 05/02/2015   NA 139 05/02/2015   K 4.8 05/02/2015    CL 104 05/02/2015   CREATININE 0.67 05/02/2015   BUN 13 05/02/2015   CO2 28 05/02/2015   TSH 0.77 05/02/2015   HGBA1C 5.9 04/27/2014    Mm Digital Screening Bilateral  07/09/2014   CLINICAL DATA:  Screening.  EXAM: DIGITAL SCREENING BILATERAL MAMMOGRAM WITH CAD  COMPARISON:  Previous exam(s).  ACR Breast Density Category c: The breast tissue is heterogeneously dense, which Ragin obscure small masses.  FINDINGS: There are no findings suspicious for malignancy. Images were processed with CAD.  IMPRESSION: No mammographic evidence of malignancy. A result letter of this screening mammogram will be mailed directly to the patient.  RECOMMENDATION: Screening mammogram in one year. (Code:[id number]-[id number])  BI-RADS CATEGORY  1: Negative.   Electronically Signed   By: Enrique Sack M.D.   On: 07/09/2014 12:31    Assessment & Plan:   Diagnoses and all orders for this visit:  Well adult exam -     lisinopril-hydrochlorothiazide (PRINZIDE,ZESTORETIC) 10-12.5 MG per tablet; Take 1 tablet by mouth daily.  Vitamin D deficiency -     lisinopril-hydrochlorothiazide (PRINZIDE,ZESTORETIC) 10-12.5 MG per tablet; Take 1 tablet by mouth daily.  Dyslipidemia -     lisinopril-hydrochlorothiazide (PRINZIDE,ZESTORETIC) 10-12.5 MG per tablet; Take 1 tablet by mouth daily.  Essential hypertension  Left lumbar radiculopathy  Frequent falls  Hyperglycemia   I am having Ms. Mckim maintain her Fish Oil, cholecalciferol, ascorbic acid, cyanocobalamin, UNABLE TO FIND, Glucosamine HCl, meloxicam, and lisinopril-hydrochlorothiazide. We will continue to administer methylPREDNISolone acetate.  No orders of the defined types were placed in this encounter.     Follow-up: No Follow-up on file.  Walker Kehr, MD

## 2015-05-03 NOTE — Assessment & Plan Note (Signed)
Ibuprofen prn 

## 2015-05-03 NOTE — Assessment & Plan Note (Signed)
On Vit D 

## 2015-05-03 NOTE — Assessment & Plan Note (Signed)
Better  

## 2015-05-03 NOTE — Assessment & Plan Note (Signed)
Prinizide 

## 2015-05-03 NOTE — Assessment & Plan Note (Signed)
Better. More focused now

## 2015-05-25 ENCOUNTER — Ambulatory Visit: Payer: Medicare Other | Admitting: Family Medicine

## 2015-06-09 ENCOUNTER — Encounter: Payer: Self-pay | Admitting: Family Medicine

## 2015-06-09 ENCOUNTER — Ambulatory Visit (INDEPENDENT_AMBULATORY_CARE_PROVIDER_SITE_OTHER): Payer: Medicare Other | Admitting: Family Medicine

## 2015-06-09 VITALS — BP 122/72 | HR 64 | Ht 61.0 in | Wt 130.0 lb

## 2015-06-09 DIAGNOSIS — G5702 Lesion of sciatic nerve, left lower limb: Secondary | ICD-10-CM | POA: Diagnosis not present

## 2015-06-09 NOTE — Patient Instructions (Signed)
You are doing great! For the core lets work on posture a bit On wall with heels, butt shoulder and head touching for a goal of 5 minutes daily pulling in belly button New exercises for the knees 3 times a week Consider cross training with biking Yoga first then pilates would be great with the core Protein!  Look at [employer], or [employer] sport Glut exercises 3 times a week.  See me again 6-8 weeks

## 2015-06-09 NOTE — Assessment & Plan Note (Signed)
Patient has made some improvement. Patient is no longer having significant radicular symptoms. We discussed phase II strengthening exercises and patient given some gluteal exercises to help with. We are going to avoid any significant extension of the back secondary to the osteoarthritis of the back this seems to be exacerbated with some any exercises. We discussed which activities to do and which ones to avoid. We also discussed different dietary changes that can also be beneficial. Patient will continue to follow-up in 6 week intervals.  Spent  25 minutes with patient face-to-face and had greater than 50% of counseling including as described above in assessment and plan.

## 2015-06-09 NOTE — Progress Notes (Signed)
Pre visit review using our clinic review tool, if applicable. No additional management support is needed unless otherwise documented below in the visit note. 

## 2015-06-09 NOTE — Progress Notes (Signed)
[facility] [address] [place] [address] Phone: [phone] Subjective:     CC: Left hip and buttocks pain followup  PXT:[id number] Lisa [name] is a 69 y.o. female coming in with complaint of left hip and buttocks pain. 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 Patient states that she continues with conservative therapy as well as the over-the-counter medications. Patient at last exam was doing significantly well with conservative therapy. Patient states overall she continues to improve very slowly. Patient still has a dull throbbing aching pain mostly on the lateral aspect of the left side of her back. Patient states some radiation into the buttocks as well as down the leg but very minimal. Patient states nothing that stops her from activity. Patient rates the severity of pain as 5 out of 10. Patient states that she continues to do the exercises but has some difficulty with some of him seeming to exacerbate some of her back pain. Patient denies any new symptoms.     Past medical history, social, surgical and family history all reviewed in electronic medical record.   Review of Systems: No headache, visual changes, nausea, vomiting, diarrhea, constipation, dizziness, abdominal pain, skin rash, fevers, chills, night sweats, weight loss, swollen lymph nodes, body aches, joint swelling, muscle aches, chest pain, shortness of breath, mood changes.   Objective Blood pressure 122/72, pulse 64, height 5\' 1"  (1.549 m), weight 130 lb (58.968 kg), SpO2 94 %.  General: No apparent distress alert and oriented x3 mood and affect normal, dressed appropriately.  HEENT: Pupils equal, extraocular movements intact  Respiratory: Patient's speak in full sentences and does not appear short of breath  Cardiovascular: No lower extremity edema, non tender, no erythema  Skin: Warm dry intact  with no signs of infection or rash on extremities or on axial skeleton.  Abdomen: Soft nontender  Neuro: Cranial nerves II through XII are intact, neurovascularly intact in all extremities with 2+ DTRs and 2+ pulses.  Lymph: No lymphadenopathy of posterior or anterior cervical chain or axillae bilaterally.  Gait normal with good balance and coordination.  MSK:  Non tender with full range of motion and good stability and symmetric strength and tone of shoulders, elbows, wrist,  knee and ankles bilaterally.  Back Exam:  Inspection: Unremarkable  Motion: Flexion 35 deg, Extension 25 deg, Side Bending to 35 deg bilaterally,  Rotation to 35 deg bilaterally  SLR laying: negative today which is an improvement XSLR laying: Negative  Palpable tenderness: minimal tenderness over the piriformis FABER: continue positive left Sensory change: Gross sensation intact to all lumbar and sacral dermatomes.  Reflexes: 2+ at both patellar tendons, 2+ at achilles tendons, Babinski's downgoing.  Strength at foot  Plantar-flexion: 5/5 Dorsi-flexion: 5/5 Eversion: 5/5 Inversion: 5/5  Leg strength  Quad: 5/5 Hamstring: 5/5 Hip flexor: 5/5 Hip abductors: 3+/5 with no improvement from previous exam Gait unremarkable. Hip: Left ROM IR: 25 Deg, ER: 45 Deg, Flexion: 120 Deg, Extension: 100 Deg, Abduction: 45 Deg, Adduction: 45 Deg Strength IR: 5/5, ER: 5/5, Flexion: 5/5, Extension: 5/5, Abduction: 5/5, Adduction: 5/5 Pelvic alignment unremarkable to inspection and palpation. Standing hip rotation and gait without trendelenburg sign / unsteadiness. Greater trochanter nontender No pain  FADIR. No SI joint tenderness and normal minimal SI movement. Contralateral hip unremarkable    Impression and Recommendations:     This case required medical decision  making of moderate complexity.

## 2015-06-23 DIAGNOSIS — H26491 Other secondary cataract, right eye: Secondary | ICD-10-CM | POA: Diagnosis not present

## 2015-06-23 DIAGNOSIS — H2640 Unspecified secondary cataract: Secondary | ICD-10-CM | POA: Insufficient documentation

## 2015-08-04 DIAGNOSIS — Z961 Presence of intraocular lens: Secondary | ICD-10-CM | POA: Diagnosis not present

## 2015-08-04 DIAGNOSIS — H35343 Macular cyst, hole, or pseudohole, bilateral: Secondary | ICD-10-CM | POA: Diagnosis not present

## 2015-08-04 DIAGNOSIS — E119 Type 2 diabetes mellitus without complications: Secondary | ICD-10-CM | POA: Diagnosis not present

## 2015-08-16 ENCOUNTER — Ambulatory Visit: Payer: Medicare Other | Admitting: Family Medicine

## 2015-09-06 ENCOUNTER — Encounter: Payer: Self-pay | Admitting: Family Medicine

## 2015-09-06 ENCOUNTER — Ambulatory Visit (INDEPENDENT_AMBULATORY_CARE_PROVIDER_SITE_OTHER): Payer: Medicare Other | Admitting: Family Medicine

## 2015-09-06 VITALS — BP 134/84 | HR 80 | Ht 61.0 in | Wt 132.0 lb

## 2015-09-06 DIAGNOSIS — G5702 Lesion of sciatic nerve, left lower limb: Secondary | ICD-10-CM

## 2015-09-06 NOTE — Progress Notes (Signed)
Pre visit review using our clinic review tool, if applicable. No additional management support is needed unless otherwise documented below in the visit note. 

## 2015-09-06 NOTE — Patient Instructions (Signed)
Good to see you Happy holidays!  You will do great!!!! For the hands ski liner gloves can help with working.  Try the pennsaid twice daily on hands if needed.  Just a fingertip amount needed.  Continue the vitamins Continue the protein! Tylenol 3 times daily during this time a year could be helpful.  No reading until you do your walking.  Stretches after painting and good luck with your "project" I will keep your secret until I see you again in 6 weeks.

## 2015-09-06 NOTE — Progress Notes (Signed)
[facility] [address], [place] [address] Phone: [phone] Subjective:     CC: Left hip and buttocks pain followup  [id number] Lisa [name] is a 69 y.o. female coming in with complaint of left hip and buttocks pain. 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  Patient was making some significant improvement. There is concern for patient having more of a piriformis syndrome versus the possibility of her having a recurrent nerve root impingement on the left side with positive straight leg test. Patient though was improving. Patient was to continue with conservative therapy. Patient was last seen 3 months ago. Patient states she has not been compliant with the home exercises. Patient does take the vitamins. Finding it difficult to be motivated to do her regular walking. Has noticed an increase in her blood pressure minorly as well as her pulse. Patient knows this is not healthy. Patient wants to remain active. Finding difficult to find the motivation. Symptoms just some worsening tightness of the back.     Past medical history, social, surgical and family history all reviewed in electronic medical record.   Review of Systems: No headache, visual changes, nausea, vomiting, diarrhea, constipation, dizziness, abdominal pain, skin rash, fevers, chills, night sweats, weight loss, swollen lymph nodes, body aches, joint swelling, muscle aches, chest pain, shortness of breath, mood changes.   Objective Blood pressure 134/84, pulse 80, height 5\' 1"  (1.549 m), weight 132 lb (59.875 kg), SpO2 98 %.  General: No apparent distress alert and oriented x3 mood and affect normal, dressed appropriately.  HEENT: Pupils equal, extraocular movements intact  Respiratory: Patient's speak in full sentences and does not appear short of breath  Cardiovascular: No lower extremity edema, non  tender, no erythema  Skin: Warm dry intact with no signs of infection or rash on extremities or on axial skeleton.  Abdomen: Soft nontender  Neuro: Cranial nerves II through XII are intact, neurovascularly intact in all extremities with 2+ DTRs and 2+ pulses.  Lymph: No lymphadenopathy of posterior or anterior cervical chain or axillae bilaterally.  Gait normal with good balance and coordination.  MSK:  Non tender with full range of motion and good stability and symmetric strength and tone of shoulders, elbows, wrist,  knee and ankles bilaterally.  Back Exam:  Inspection: Unremarkable  Motion: Flexion 40 deg, Extension 25 deg, Side Bending to 35 deg bilaterally,  Rotation to 25 deg bilaterally  SLR laying: negative  XSLR laying: Negative  Palpable tenderness: minimal tenderness over the piriformis FABER: continue positive left Sensory change: Gross sensation intact to all lumbar and sacral dermatomes.  Reflexes: 2+ at both patellar tendons, 2+ at achilles tendons, Babinski's downgoing.  Strength at foot  Plantar-flexion: 5/5 Dorsi-flexion: 5/5 Eversion: 5/5 Inversion: 5/5  Leg strength  Quad: 5/5 Hamstring: 5/5 Hip flexor: 5/5 Hip abductors: 4/5 symmetric with very minimal improvement. Gait unremarkable. Hip: Left ROM IR: 25 Deg, ER: 45 Deg, Flexion: 120 Deg, Extension: 100 Deg, Abduction: 45 Deg, Adduction: 45 Deg Strength IR: 5/5, ER: 5/5, Flexion: 5/5, Extension: 5/5, Abduction: 5/5, Adduction: 5/5 Pelvic alignment unremarkable to inspection and palpation. Standing hip rotation and gait without trendelenburg sign / unsteadiness. Greater trochanter nontender No pain  FADIR. No SI joint tenderness and normal minimal SI movement. Contralateral hip unremarkable No significant change from previous exam.   Impression and Recommendations:     This case  required medical decision making of moderate complexity.

## 2015-09-06 NOTE — Assessment & Plan Note (Signed)
Patient is doing fairly well overall, patient is doing relatively well. Patient is just lacking the motivation. Patient has any anti-inflammatory if needed. Patient is doing the vitamins. We discussed positive reinforcement for patient. Patient is going to start working on her old home as well which will be difficult. Patient will see if she can do this. Patient is having increasing stress over the holidays. Discussed that with no significant changes until after the holidays. Patient will come back to 6 weeks after starting the exercises more regular. If continuing have trouble with fine the motivation we Sch[place] need to consider formal physical therapy.  Spent  25 minutes with patient face-to-face and had greater than 50% of counseling including as described above in assessment and plan.

## 2015-10-18 ENCOUNTER — Ambulatory Visit: Payer: Medicare Other | Admitting: Family Medicine

## 2015-11-10 ENCOUNTER — Ambulatory Visit: Payer: Medicare Other | Admitting: Internal Medicine

## 2015-11-14 ENCOUNTER — Ambulatory Visit: Payer: Medicare Other | Admitting: Family Medicine

## 2015-11-30 ENCOUNTER — Encounter: Payer: Self-pay | Admitting: Family Medicine

## 2015-11-30 ENCOUNTER — Ambulatory Visit (INDEPENDENT_AMBULATORY_CARE_PROVIDER_SITE_OTHER): Payer: Medicare Other | Admitting: Family Medicine

## 2015-11-30 VITALS — BP 126/70 | HR 79 | Ht 61.0 in | Wt 133.0 lb

## 2015-11-30 DIAGNOSIS — G5702 Lesion of sciatic nerve, left lower limb: Secondary | ICD-10-CM | POA: Diagnosis not present

## 2015-11-30 DIAGNOSIS — M5416 Radiculopathy, lumbar region: Secondary | ICD-10-CM | POA: Diagnosis not present

## 2015-11-30 DIAGNOSIS — M24559 Contracture, unspecified hip: Secondary | ICD-10-CM | POA: Diagnosis not present

## 2015-11-30 NOTE — Assessment & Plan Note (Signed)
Patient is having more hip flexor tightness than previous exam. We discussed icing regimen, we discussed new exercises and patient given a handout. We discussed which activities would be better and which ones to avoid. We will continue to monitor and if any radicular symptoms occur again we will need to have patient be seen again. Otherwise patient will check in by email in 4 weeks.  Spent  25 minutes with patient face-to-face and had greater than 50% of counseling including as described above in assessment and plan.

## 2015-11-30 NOTE — Patient Instructions (Signed)
Great to see you  Continue the vitamins You are doing great  Ice for the knee and heat for the back  Consider tart cherry extract pills at night to help a little bit New exercises for the back after gardening only  Send me a message in 4  Weeks for an update Otherwise see me when you need me.   Generic Hip Exercises RANGE OF MOTION (ROM) AND STRETCHING EXERCISES  These exercises Schlechter help you when beginning to rehabilitate your injury. Doing them too aggressively can worsen your condition. Complete them slowly and gently. Your symptoms Bear resolve with or without further involvement from your physician, physical therapist or athletic trainer. While completing these exercises, remember:   Restoring tissue flexibility helps normal motion to return to the joints. This allows healthier, less painful movement and activity.  An effective stretch should be held for at least 30 seconds.  A stretch should never be painful. You should only feel a gentle lengthening or release in the stretched tissue. If these stretches worsen your symptoms even when done gently, consult your physician, physical therapist or athletic trainer. STRETCH - Hamstrings, Supine   Lie on your back. Loop a belt or towel over the ball of your right / left foot.  Straighten your right / left knee and slowly pull on the belt to raise your leg. Do not allow the right / left knee to bend. Keep your opposite leg flat on the floor.  Raise the leg until you feel a gentle stretch behind your right / left knee or thigh. Hold this position for __________ seconds. Repeat __________ times. Complete this stretch __________ times per day.  STRETCH - Hip Rotators   Lie on your back on a firm surface. Grasp your right / left knee with your right / left hand and your ankle with your opposite hand.  Keeping your hips and shoulders firmly planted, gently pull your right / left knee and rotate your lower leg toward your opposite shoulder until  you feel a stretch in your buttocks.  Hold this stretch for __________ seconds. Repeat this stretch __________ times. Complete this stretch __________ times per day. STRETCH - Hamstrings/Adductors, V-Sit   Sit on the floor with your legs extended in a large "V," keeping your knees straight.  With your head and chest upright, bend at your waist reaching for your right foot to stretch your left adductors.  You should feel a stretch in your left inner thigh. Hold for __________ seconds.  Return to the upright position to relax your leg muscles.  Continuing to keep your chest upright, bend straight forward at your waist to stretch your hamstrings.  You should feel a stretch behind both of your thighs and/or knees. Hold for __________ seconds.  Return to the upright position to relax your leg muscles.  Repeat steps 2 through 4 for opposite leg. Repeat __________ times. Complete this exercise __________ times per day.  STRETCHING - Hip Flexors, Lunge  Half kneel with your right / left knee on the floor and your opposite knee bent and directly over your ankle.  Keep good posture with your head over your shoulders. Tighten your buttocks to point your tailbone downward; this will prevent your back from arching too much.  You should feel a gentle stretch in the front of your thigh and/or hip. If you do not feel any resistance, slightly slide your opposite foot forward and then slowly lunge forward so your knee once again lines up over  your ankle. Be sure your tailbone remains pointed downward.  Hold this stretch for __________ seconds. Repeat __________ times. Complete this stretch __________ times per day. STRENGTHENING EXERCISES These exercises Licht help you when beginning to rehabilitate your injury. They Sanks resolve your symptoms with or without further involvement from your physician, physical therapist or athletic trainer. While completing these exercises, remember:   Muscles can gain  both the endurance and the strength needed for everyday activities through controlled exercises.  Complete these exercises as instructed by your physician, physical therapist or athletic trainer. Progress the resistance and repetitions only as guided.  You Ibbotson experience muscle soreness or fatigue, but the pain or discomfort you are trying to eliminate should never worsen during these exercises. If this pain does worsen, stop and make certain you are following the directions exactly. If the pain is still present after adjustments, discontinue the exercise until you can discuss the trouble with your clinician. STRENGTH - Hip Extensors, Bridge   Lie on your back on a firm surface. Bend your knees and place your feet flat on the floor.  Tighten your buttocks muscles and lift your bottom off the floor until your trunk is level with your thighs. You should feel the muscles in your buttocks and back of your thighs working. If you do not feel these muscles, slide your feet 1-2 inches further away from your buttocks.  Hold this position for __________ seconds.  Slowly lower your hips to the starting position and allow your buttock muscles relax completely before beginning the next repetition.  If this exercise is too easy, you Saldarriaga cross your arms over your chest. Repeat __________ times. Complete this exercise __________ times per day.  STRENGTH - Hip Abductors, Straight Leg Raises  Be aware of your form throughout the entire exercise so that you exercise the correct muscles. Sloppy form means that you are not strengthening the correct muscles.  Lie on your side so that your head, shoulders, knee and hip line up. You Agyeman bend your lower knee to help maintain your balance. Your right / left leg should be on top.  Roll your hips slightly forward, so that your hips are stacked directly over each other and your right / left knee is facing forward.  Lift your top leg up 4-6 inches, leading with your heel.  Be sure that your foot does not drift forward or that your knee does not roll toward the ceiling.  Hold this position for __________ seconds. You should feel the muscles in your outer hip lifting (you Meditz not notice this until your leg begins to tire).  Slowly lower your leg to the starting position. Allow the muscles to fully relax before beginning the next repetition. Repeat __________ times. Complete this exercise __________ times per day.  STRENGTH - Hip Adductors, Straight Leg Raises   Lie on your side so that your head, shoulders, knee and hip line up. You Uher place your upper foot in front to help maintain your balance. Your right / left leg should be on the bottom.  Roll your hips slightly forward, so that your hips are stacked directly over each other and your right / left knee is facing forward.  Tense the muscles in your inner thigh and lift your bottom leg 4-6 inches. Hold this position for __________ seconds.  Slowly lower your leg to the starting position. Allow the muscles to fully relax before beginning the next repetition. Repeat __________ times. Complete this exercise __________ times per  day.  STRENGTH - Quadriceps, Straight Leg Raises  Quality counts! Watch for signs that the quadriceps muscle is working to insure you are strengthening the correct muscles and not "cheating" by substituting with healthier muscles.  Lay on your back with your right / left leg extended and your opposite knee bent.  Tense the muscles in the front of your right / left thigh. You should see either your knee cap slide up or increased dimpling just above the knee. Your thigh Cormier even quiver.  Tighten these muscles even more and raise your leg 4 to 6 inches off the floor. Hold for right / left seconds.  Keeping these muscles tense, lower your leg.  Relax the muscles slowly and completely in between each repetition. Repeat __________ times. Complete this exercise __________ times per day.    STRENGTH - Hip Abductors, Standing  Tie one end of a rubber exercise band/tubing to a secure surface (table, pole) and tie a loop at the other end.  Place the loop around your right / left ankle. Keeping your ankle with the band directly opposite of the secured end, step away until there is tension in the tube/band.  Hold onto a chair as needed for balance.  Keeping your back upright, your shoulders over your hips, and your toes pointing forward, lift your right / left leg out to your side. Be sure to lift your leg with your hip muscles. Do not "throw" your leg or tip your body to lift your leg.  Slowly and with control, return to the starting position. Repeat exercise __________ times. Complete this exercise __________ times per day.  STRENGTH - Quadriceps, Squats  Stand in a door frame so that your feet and knees are in line with the frame.  Use your hands for balance, not support, on the frame.  Slowly lower your weight, bending at the hips and knees. Keep your lower legs upright so that they are parallel with the door frame. Squat only within the range that does not increase your knee pain. Never let your hips drop below your knees.  Slowly return upright, pushing with your legs, not pulling with your hands.   This information is not intended to replace advice given to you by your health care provider. Make sure you discuss any questions you have with your health care provider.   Document Released: 09/21/2005 Document Revised: 09/24/2014 Document Reviewed: 12/16/2008 Elsevier Interactive Patient Education [employer].

## 2015-11-30 NOTE — Progress Notes (Signed)
Pre visit review using our clinic review tool, if applicable. No additional management support is needed unless otherwise documented below in the visit note. 

## 2015-11-30 NOTE — Progress Notes (Signed)
[name] [address] [place], [place] [address] Phone: [phone] Subjective:     CC: Left hip and buttocks pain followup  [id number] Lisa [name] is a 70 y.o. female coming in with complaint of left hip and buttocks pain. 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  Patient states that her back seems to be doing relatively well. Not having as much of the hip pain. Mild right knee pain that seems to be improving at this time. Patient states sometimes can cause a little exacerbation in the back. Patient states that when gardening she can have some tightness. Does respond well to ice or heat. Not doing any specific exercises at this time. Trying to stay active. Having increasing stress recently.  Past Medical History  Diagnosis Date  . Hyperlipidemia   . Hypertension   . Cataract 2009    right-Dr. Gershon Crane  . Retinal tear 2008    B- Dr. Charolette Forward  . Vitamin D deficiency    Past Surgical History  Procedure Laterality Date  . Cataract extraction      Dr. Gershon Crane  . Retinal tear repair cryotherapy      Dr. Charolette Forward   Social History   Social History  . Marital Status: Single    Spouse Name: N/A  . Number of Children: N/A  . Years of Education: N/A   Occupational History  . nutritionist    Social History Main Topics  . Smoking status: Never Smoker   . Smokeless tobacco: Not on file  . Alcohol Use: No  . Drug Use: No  . Sexual Activity: Not on file   Other Topics Concern  . Not on file   Social History Narrative   Regular exercise- Yes   Allergies  Allergen Reactions  . Atorvastatin     REACTION: achy  . Ezetimibe-Simvastatin     REACTION: myalgia  . Fish Oil     REACTION: bruising  . Livalo [Pitavastatin Calcium]     pain  . Lovastatin     REACTION: myalgias  . Rosuvastatin     REACTION: myalgia  . Zetia [Ezetimibe]     Side effects    Family History  Problem Relation Age of Onset  . COPD Father   . COPD Sister   . Hyperlipidemia Other         Past medical history, social, surgical and family history all reviewed in electronic medical record.   Review of Systems: No headache, visual changes, nausea, vomiting, diarrhea, constipation, dizziness, abdominal pain, skin rash, fevers, chills, night sweats, weight loss, swollen lymph nodes, body aches, joint swelling, muscle aches, chest pain, shortness of breath, mood changes.   Objective Blood pressure 126/70, pulse 79, height 5\' 1"  (1.549 m), weight 133 lb (60.328 kg), SpO2 98 %.  General: No apparent distress alert and oriented x3 mood and affect normal, dressed appropriately.  HEENT: Pupils equal, extraocular movements intact  Respiratory: Patient's speak in full sentences and does not appear short of breath  Cardiovascular: No lower extremity edema, non tender, no erythema  Skin: Warm dry intact with no signs of infection or rash on extremities or on axial skeleton.  Abdomen: Soft nontender  Neuro: Cranial nerves II through XII are intact, neurovascularly intact in all extremities with 2+ DTRs and 2+ pulses.  Lymph: No lymphadenopathy of posterior or anterior cervical chain or axillae bilaterally.  Gait  normal with good balance and coordination.  MSK:  Non tender with full range of motion and good stability and symmetric strength and tone of shoulders, elbows, wrist,  knee and ankles bilaterally.  Back Exam:  Inspection: Unremarkable  Motion: Flexion 40 deg, Extension 25 deg, Side Bending to 35 deg bilaterally,  Rotation to 25 deg bilaterally  SLR laying: negative  XSLR laying: Negative  Palpable tenderness: minimal tenderness over the piriformis FABER: continue positive leftMore tightness of the hip flexors bilaterally Sensory change: Gross sensation intact to all lumbar and sacral dermatomes.  Reflexes: 2+ at both patellar tendons, 2+ at achilles tendons,  Babinski's downgoing.  Strength at foot  Plantar-flexion: 5/5 Dorsi-flexion: 5/5 Eversion: 5/5 Inversion: 5/5  Leg strength  Quad: 5/5 Hamstring: 5/5 Hip flexor: 5/5 Hip abductors: 4/5 symmetric with very minimal improvement. Gait unremarkable. Hip: Left ROM IR: 25 Deg, ER: 45 Deg, Flexion: 120 Deg, Extension: 100 Deg, Abduction: 45 Deg, Adduction: 45 Deg Strength IR: 5/5, ER: 5/5, Flexion: 5/5, Extension: 5/5, Abduction: 5/5, Adduction: 5/5 Pelvic alignment unremarkable to inspection and palpation. Standing hip rotation and gait without trendelenburg sign / unsteadiness. Greater trochanter nontender No pain  FADIR. No SI joint tenderness and normal minimal SI movement. Contralateral hip unremarkable Continues to do remarkably well.   Impression and Recommendations:     This case required medical decision making of moderate complexity.

## 2015-12-05 ENCOUNTER — Other Ambulatory Visit (INDEPENDENT_AMBULATORY_CARE_PROVIDER_SITE_OTHER): Payer: Medicare Other

## 2015-12-05 DIAGNOSIS — E559 Vitamin D deficiency, unspecified: Secondary | ICD-10-CM

## 2015-12-05 DIAGNOSIS — E785 Hyperlipidemia, unspecified: Secondary | ICD-10-CM | POA: Diagnosis not present

## 2015-12-05 DIAGNOSIS — I10 Essential (primary) hypertension: Secondary | ICD-10-CM | POA: Diagnosis not present

## 2015-12-05 DIAGNOSIS — R739 Hyperglycemia, unspecified: Secondary | ICD-10-CM | POA: Diagnosis not present

## 2015-12-05 DIAGNOSIS — R296 Repeated falls: Secondary | ICD-10-CM

## 2015-12-05 DIAGNOSIS — Z0000 Encounter for general adult medical examination without abnormal findings: Secondary | ICD-10-CM

## 2015-12-05 DIAGNOSIS — M5416 Radiculopathy, lumbar region: Secondary | ICD-10-CM

## 2015-12-05 LAB — BASIC METABOLIC PANEL
BUN: 18 mg/dL (ref 6–23)
CHLORIDE: 100 meq/L (ref 96–112)
CO2: 29 mEq/L (ref 19–32)
CREATININE: 0.61 mg/dL (ref 0.40–1.20)
Calcium: 9.4 mg/dL (ref 8.4–10.5)
GFR: 103.11 mL/min (ref >60.00)
Glucose, Bld: 101 mg/dL — ABNORMAL HIGH (ref 70–99)
Potassium: 4.4 mEq/L (ref 3.5–5.1)
SODIUM: 135 meq/L (ref 135–145)

## 2015-12-05 LAB — LDL CHOLESTEROL, DIRECT: LDL DIRECT: 187 mg/dL

## 2015-12-05 LAB — HEPATIC FUNCTION PANEL
ALT: 22 U/L (ref 0–35)
AST: 17 U/L (ref 0–37)
Albumin: 4.1 g/dL (ref 3.5–5.2)
Alkaline Phosphatase: 58 U/L (ref 39–117)
Bilirubin, Direct: 0.1 mg/dL (ref 0.0–0.3)
TOTAL PROTEIN: 7 g/dL (ref 6.0–8.3)
Total Bilirubin: 0.4 mg/dL (ref 0.2–1.2)

## 2015-12-05 LAB — LIPID PANEL
CHOL/HDL RATIO: 5
CHOLESTEROL: 281 mg/dL — AB (ref 0–200)
HDL: 57.2 mg/dL (ref >39.00)
NonHDL: 224.05
TRIGLYCERIDES: 222 mg/dL — AB (ref 0.0–149.0)
VLDL: 44.4 mg/dL — ABNORMAL HIGH (ref 0.0–40.0)

## 2015-12-05 LAB — HEMOGLOBIN A1C: HEMOGLOBIN A1C: 6.1 % (ref 4.6–6.5)

## 2015-12-07 ENCOUNTER — Encounter: Payer: Self-pay | Admitting: Internal Medicine

## 2015-12-07 ENCOUNTER — Ambulatory Visit (INDEPENDENT_AMBULATORY_CARE_PROVIDER_SITE_OTHER): Payer: Medicare Other | Admitting: Internal Medicine

## 2015-12-07 VITALS — BP 130/80 | HR 75 | Wt 132.0 lb

## 2015-12-07 DIAGNOSIS — M5416 Radiculopathy, lumbar region: Secondary | ICD-10-CM

## 2015-12-07 DIAGNOSIS — E785 Hyperlipidemia, unspecified: Secondary | ICD-10-CM

## 2015-12-07 DIAGNOSIS — I10 Essential (primary) hypertension: Secondary | ICD-10-CM

## 2015-12-07 DIAGNOSIS — R739 Hyperglycemia, unspecified: Secondary | ICD-10-CM

## 2015-12-07 DIAGNOSIS — M7632 Iliotibial band syndrome, left leg: Secondary | ICD-10-CM

## 2015-12-07 MED ORDER — ICOSAPENT ETHYL 1 G PO CAPS: 2.0000 | ORAL_CAPSULE | Freq: Two times a day (BID) | ORAL | Status: DC

## 2015-12-07 NOTE — Assessment & Plan Note (Signed)
Trial of Vascepa

## 2015-12-07 NOTE — Assessment & Plan Note (Signed)
On Turmeric

## 2015-12-07 NOTE — Assessment & Plan Note (Signed)
Prinizide 

## 2015-12-07 NOTE — Progress Notes (Signed)
Pre visit review using our clinic review tool, if applicable. No additional management support is needed unless otherwise documented below in the visit note. 

## 2015-12-07 NOTE — Assessment & Plan Note (Signed)
a1c

## 2015-12-07 NOTE — Patient Instructions (Signed)
Cologuard info given

## 2015-12-07 NOTE — Progress Notes (Signed)
Subjective:  Patient ID: Lisa [name], female    DOB: 1946/02/06  Age: 70 y.o. MRN: [id number]  CC: No chief complaint on file.   HPI Lisa [name] presents for dyslipidemia, OA, HTN f/u Cologuard info given  Outpatient Prescriptions Prior to Visit  Medication Sig Dispense Refill  . ascorbic acid (VITAMIN C) 500 MG tablet Take 500 mg by mouth as needed.     . cholecalciferol (VITAMIN D) 1000 UNITS tablet Take 2,000 Units by mouth daily.     . cyanocobalamin 1000 MCG tablet Take 1,000 mcg by mouth daily.    . Glucosamine HCl 500 MG TABS Take by mouth 2 (two) times daily.     Marland Kitchen lisinopril-hydrochlorothiazide (PRINZIDE,ZESTORETIC) 10-12.5 MG per tablet Take 1 tablet by mouth daily. 90 tablet 3  . Omega-3 Fatty Acids (FISH OIL) 1000 MG CAPS Take 1 capsule by mouth 2 (two) times daily. Mega Red w/krill oil    . Turmeric 500 MG TABS Take 2 capsules by mouth 2 (two) times daily.     Facility-Administered Medications Prior to Visit  Medication Dose Route Frequency Provider Last Rate Last Dose  . methylPREDNISolone acetate (DEPO-MEDROL) injection 40 mg  40 mg Intra-articular Once Aleksei [name] V, MD        ROS Review of Systems  Constitutional: Negative for chills, activity change, appetite change, fatigue and unexpected weight change.  HENT: Negative for congestion, mouth sores and sinus pressure.   Eyes: Negative for visual disturbance.  Respiratory: Negative for cough and chest tightness.   Gastrointestinal: Negative for nausea and abdominal pain.  Genitourinary: Negative for frequency, difficulty urinating and vaginal pain.  Musculoskeletal: Positive for back pain. Negative for gait problem.  Skin: Negative for pallor and rash.  Neurological: Negative for dizziness, tremors, weakness, numbness and headaches.  Psychiatric/Behavioral: Negative for confusion and sleep disturbance. The patient is not nervous/anxious.     Objective:  BP 130/80 mmHg  Pulse 75  Wt 132 lb  (59.875 kg)  SpO2 95%  BP Readings from Last 3 Encounters:  12/07/15 130/80  11/30/15 126/70  09/06/15 134/84    Wt Readings from Last 3 Encounters:  12/07/15 132 lb (59.875 kg)  11/30/15 133 lb (60.328 kg)  09/06/15 132 lb (59.875 kg)    Physical Exam  Constitutional: She appears well-developed. No distress.  HENT:  Head: Normocephalic.  Right Ear: External ear normal.  Left Ear: External ear normal.  Nose: Nose normal.  Mouth/Throat: Oropharynx is clear and moist.  Eyes: Conjunctivae are normal. Pupils are equal, round, and reactive to light. Right eye exhibits no discharge. Left eye exhibits no discharge.  Neck: Normal range of motion. Neck supple. No JVD present. No tracheal deviation present. No thyromegaly present.  Cardiovascular: Normal rate, regular rhythm and normal heart sounds.   Pulmonary/Chest: No stridor. No respiratory distress. She has no wheezes.  Abdominal: Soft. Bowel sounds are normal. She exhibits no distension and no mass. There is no tenderness. There is no rebound and no guarding.  Musculoskeletal: She exhibits tenderness. She exhibits no edema.  Lymphadenopathy:    She has no cervical adenopathy.  Neurological: She displays normal reflexes. No cranial nerve deficit. She exhibits normal muscle tone. Coordination normal.  Skin: No rash noted. No erythema.  Psychiatric: She has a normal mood and affect. Her behavior is normal. Judgment and thought content normal.    Lab Results  Component Value Date   WBC 5.6 05/02/2015   HGB 14.2 05/02/2015   HCT 42.2 05/02/2015  PLT 266.0 05/02/2015   GLUCOSE 101* 12/05/2015   CHOL 281* 12/05/2015   TRIG 222.0* 12/05/2015   HDL 57.20 12/05/2015   LDLDIRECT 187.0 12/05/2015   LDLCALC 165* 05/02/2015   ALT 22 12/05/2015   AST 17 12/05/2015   NA 135 12/05/2015   K 4.4 12/05/2015   CL 100 12/05/2015   CREATININE 0.61 12/05/2015   BUN 18 12/05/2015   CO2 29 12/05/2015   TSH 0.77 05/02/2015   HGBA1C 6.1  12/05/2015    Mm Digital Screening Bilateral  07/09/2014  CLINICAL DATA:  Screening. EXAM: DIGITAL SCREENING BILATERAL MAMMOGRAM WITH CAD COMPARISON:  Previous exam(s). ACR Breast Density Category c: The breast tissue is heterogeneously dense, which Eddins obscure small masses. FINDINGS: There are no findings suspicious for malignancy. Images were processed with CAD. IMPRESSION: No mammographic evidence of malignancy. A result letter of this screening mammogram will be mailed directly to the patient. RECOMMENDATION: Screening mammogram in one year. (Code:SM-B-01Y) BI-RADS CATEGORY  1: Negative. Electronically Signed   By: Lisa [name] M.D.   On: 07/09/2014 12:31    Assessment & Plan:   There are no diagnoses linked to this encounter. I am having Ms. Apple maintain her Fish Oil, cholecalciferol, ascorbic acid, cyanocobalamin, Glucosamine HCl, lisinopril-hydrochlorothiazide, Turmeric, and TART CHERRY ADVANCED. We will continue to administer methylPREDNISolone acetate.  Meds ordered this encounter  Medications  . Misc Natural Products (TART CHERRY ADVANCED) CAPS    Sig: Take 1 capsule by mouth at bedtime.     Follow-up: Return in about 6 months (around 06/08/2016) for Wellness Exam.  Walker Kehr, MD

## 2016-01-05 ENCOUNTER — Telehealth: Payer: Self-pay

## 2016-01-05 NOTE — Telephone Encounter (Signed)
PA initiated via CoverMyMeds Key [id number]

## 2016-01-05 NOTE — Telephone Encounter (Signed)
PA APPROVED  

## 2016-04-18 ENCOUNTER — Other Ambulatory Visit: Payer: Self-pay | Admitting: Internal Medicine

## 2016-04-18 DIAGNOSIS — Z1231 Encounter for screening mammogram for malignant neoplasm of breast: Secondary | ICD-10-CM

## 2016-05-06 ENCOUNTER — Other Ambulatory Visit: Payer: Self-pay | Admitting: Internal Medicine

## 2016-05-10 ENCOUNTER — Ambulatory Visit: Payer: Medicare Other

## 2016-05-23 DIAGNOSIS — L821 Other seborrheic keratosis: Secondary | ICD-10-CM | POA: Diagnosis not present

## 2016-05-23 DIAGNOSIS — L7211 Pilar cyst: Secondary | ICD-10-CM | POA: Diagnosis not present

## 2016-05-23 DIAGNOSIS — D239 Other benign neoplasm of skin, unspecified: Secondary | ICD-10-CM | POA: Diagnosis not present

## 2016-05-30 ENCOUNTER — Ambulatory Visit
Admission: RE | Admit: 2016-05-30 | Discharge: 2016-05-30 | Disposition: A | Discharge status: Home / Self Care | Payer: Medicare Other | Source: Ambulatory Visit | Attending: Internal Medicine | Admitting: Internal Medicine

## 2016-05-30 DIAGNOSIS — Z1231 Encounter for screening mammogram for malignant neoplasm of breast: Secondary | ICD-10-CM | POA: Diagnosis not present

## 2016-06-05 ENCOUNTER — Other Ambulatory Visit: Payer: Self-pay | Admitting: Internal Medicine

## 2016-06-05 DIAGNOSIS — R928 Other abnormal and inconclusive findings on diagnostic imaging of breast: Secondary | ICD-10-CM

## 2016-06-07 ENCOUNTER — Ambulatory Visit (INDEPENDENT_AMBULATORY_CARE_PROVIDER_SITE_OTHER): Payer: Medicare Other | Admitting: Internal Medicine

## 2016-06-07 ENCOUNTER — Encounter: Payer: Self-pay | Admitting: Internal Medicine

## 2016-06-07 ENCOUNTER — Telehealth: Payer: Self-pay | Admitting: Emergency Medicine

## 2016-06-07 VITALS — BP 134/76 | HR 77 | Ht 61.0 in | Wt 131.0 lb

## 2016-06-07 DIAGNOSIS — R739 Hyperglycemia, unspecified: Secondary | ICD-10-CM | POA: Diagnosis not present

## 2016-06-07 DIAGNOSIS — I10 Essential (primary) hypertension: Secondary | ICD-10-CM

## 2016-06-07 DIAGNOSIS — E785 Hyperlipidemia, unspecified: Secondary | ICD-10-CM | POA: Diagnosis not present

## 2016-06-07 NOTE — Assessment & Plan Note (Signed)
Vascepa Labs

## 2016-06-07 NOTE — Assessment & Plan Note (Signed)
On Prinizide

## 2016-06-07 NOTE — Progress Notes (Signed)
Subjective:  Patient ID: Lisa [name], female    DOB: Laatsch 21, 1947  Age: 70 y.o. MRN: [id number]  CC: No chief complaint on file.   HPI Amme Lumpkin Swarthout presents for dyslipidemia, HTN, OA f/u  Outpatient Medications Prior to Visit  Medication Sig Dispense Refill  . ascorbic acid (VITAMIN C) 500 MG tablet Take 500 mg by mouth as needed.     . cholecalciferol (VITAMIN D) 1000 UNITS tablet Take 2,000 Units by mouth daily.     . cyanocobalamin 1000 MCG tablet Take 1,000 mcg by mouth daily.    . Glucosamine HCl 500 MG TABS Take by mouth 2 (two) times daily.     Vanessa Kick Ethyl (VASCEPA) 1 g CAPS Take 2 capsules by mouth 2 (two) times daily. 120 capsule 11  . lisinopril-hydrochlorothiazide (PRINZIDE,ZESTORETIC) 10-12.5 MG tablet TAKE 1 TABLET BY MOUTH DAILY. 90 tablet 1  . Turmeric 500 MG TABS Take 2 capsules by mouth 2 (two) times daily.    . Misc Natural Products (TART CHERRY ADVANCED) CAPS Take 1 capsule by mouth at bedtime.     Facility-Administered Medications Prior to Visit  Medication Dose Route Frequency Provider Last Rate Last Dose  . methylPREDNISolone acetate (DEPO-MEDROL) injection 40 mg  40 mg Intra-articular Once Cassandria Anger, MD        ROS Review of Systems  Constitutional: Negative for activity change, appetite change, chills, fatigue and unexpected weight change.  HENT: Negative for congestion, mouth sores and sinus pressure.   Eyes: Negative for visual disturbance.  Respiratory: Negative for cough and chest tightness.   Gastrointestinal: Negative for abdominal pain and nausea.  Genitourinary: Negative for difficulty urinating, frequency and vaginal pain.  Musculoskeletal: Positive for arthralgias. Negative for back pain and gait problem.  Skin: Negative for pallor and rash.  Neurological: Negative for dizziness, tremors, weakness, numbness and headaches.  Psychiatric/Behavioral: Negative for confusion and sleep disturbance.    Objective:  BP 134/76    Pulse 77   Ht 5\' 1"  (1.549 m)   Wt 131 lb (59.4 kg)   SpO2 97%   BMI 24.75 kg/m   BP Readings from Last 3 Encounters:  06/07/16 134/76  12/07/15 130/80  11/30/15 126/70    Wt Readings from Last 3 Encounters:  06/07/16 131 lb (59.4 kg)  12/07/15 132 lb (59.9 kg)  11/30/15 133 lb (60.3 kg)    Physical Exam  Constitutional: She appears well-developed. No distress.  HENT:  Head: Normocephalic.  Right Ear: External ear normal.  Left Ear: External ear normal.  Nose: Nose normal.  Mouth/Throat: Oropharynx is clear and moist.  Eyes: Conjunctivae are normal. Pupils are equal, round, and reactive to light. Right eye exhibits no discharge. Left eye exhibits no discharge.  Neck: Normal range of motion. Neck supple. No JVD present. No tracheal deviation present. No thyromegaly present.  Cardiovascular: Normal rate, regular rhythm and normal heart sounds.   Pulmonary/Chest: No stridor. No respiratory distress. She has no wheezes.  Abdominal: Soft. Bowel sounds are normal. She exhibits no distension and no mass. There is no tenderness. There is no rebound and no guarding.  Musculoskeletal: She exhibits no edema or tenderness.  Lymphadenopathy:    She has no cervical adenopathy.  Neurological: She displays normal reflexes. No cranial nerve deficit. She exhibits normal muscle tone. Coordination normal.  Skin: No rash noted. No erythema.  Psychiatric: She has a normal mood and affect. Her behavior is normal. Judgment and thought content normal.    Lab Results  Component Value Date   WBC 5.6 05/02/2015   HGB 14.2 05/02/2015   HCT 42.2 05/02/2015   PLT 266.0 05/02/2015   GLUCOSE 101 (H) 12/05/2015   CHOL 281 (H) 12/05/2015   TRIG 222.0 (H) 12/05/2015   HDL 57.20 12/05/2015   LDLDIRECT 187.0 12/05/2015   LDLCALC 165 (H) 05/02/2015   ALT 22 12/05/2015   AST 17 12/05/2015   NA 135 12/05/2015   K 4.4 12/05/2015   CL 100 12/05/2015   CREATININE 0.61 12/05/2015   BUN 18 12/05/2015    CO2 29 12/05/2015   TSH 0.77 05/02/2015   HGBA1C 6.1 12/05/2015    Mm Digital Screening Bilateral  Result Date: 06/04/2016 CLINICAL DATA:  Screening. EXAM: DIGITAL SCREENING BILATERAL MAMMOGRAM WITH CAD COMPARISON:  Previous exam(s). ACR Breast Density Category b: There are scattered areas of fibroglandular density. FINDINGS: In the right breast, a possible mass warrants further evaluation. In the left breast, no findings suspicious for malignancy. Images were processed with CAD. IMPRESSION: Further evaluation is suggested for possible mass in the right breast. RECOMMENDATION: Diagnostic mammogram and possibly ultrasound of the right breast. (Code:[id number]) The patient will be contacted regarding the findings, and additional imaging will be scheduled. BI-RADS CATEGORY  0: Incomplete. Need additional imaging evaluation and/or prior mammograms for comparison. Electronically Signed   By: Ammie Ferrier M.D.   On: 06/04/2016 08:51    Assessment & Plan:   There are no diagnoses linked to this encounter. I am having Ms. Haen maintain her cholecalciferol, ascorbic acid, cyanocobalamin, Glucosamine HCl, Turmeric, TART CHERRY ADVANCED, Icosapent Ethyl, and lisinopril-hydrochlorothiazide. We will continue to administer methylPREDNISolone acetate.  No orders of the defined types were placed in this encounter.    Follow-up: No Follow-up on file.  Walker Kehr, MD

## 2016-06-07 NOTE — Assessment & Plan Note (Signed)
Labs

## 2016-06-07 NOTE — Telephone Encounter (Signed)
[facility] is faxing an order over that needs to be signed off on by tomorrow. The patients appointment is tomorrow at 2:50pm. Thanks.

## 2016-06-07 NOTE — Progress Notes (Signed)
Pre visit review using our clinic review tool, if applicable. No additional management support is needed unless otherwise documented below in the visit note. 

## 2016-06-07 NOTE — Telephone Encounter (Signed)
Order in PCP's Red folder for review/signature. I will fax it back when he completes and returns to me.

## 2016-06-08 ENCOUNTER — Ambulatory Visit
Admission: RE | Admit: 2016-06-08 | Discharge: 2016-06-08 | Disposition: A | Discharge status: Home / Self Care | Payer: Medicare Other | Source: Ambulatory Visit | Attending: Internal Medicine | Admitting: Internal Medicine

## 2016-06-08 DIAGNOSIS — R928 Other abnormal and inconclusive findings on diagnostic imaging of breast: Secondary | ICD-10-CM

## 2016-06-28 ENCOUNTER — Other Ambulatory Visit (INDEPENDENT_AMBULATORY_CARE_PROVIDER_SITE_OTHER): Payer: Medicare Other

## 2016-06-28 DIAGNOSIS — E785 Hyperlipidemia, unspecified: Secondary | ICD-10-CM

## 2016-06-28 DIAGNOSIS — R739 Hyperglycemia, unspecified: Secondary | ICD-10-CM | POA: Diagnosis not present

## 2016-06-28 LAB — HEPATIC FUNCTION PANEL
ALBUMIN: 3.7 g/dL (ref 3.5–5.2)
ALT: 22 U/L (ref 0–35)
AST: 18 U/L (ref 0–37)
Alkaline Phosphatase: 59 U/L (ref 39–117)
Bilirubin, Direct: 0 mg/dL (ref 0.0–0.3)
TOTAL PROTEIN: 6.6 g/dL (ref 6.0–8.3)
Total Bilirubin: 0.5 mg/dL (ref 0.2–1.2)

## 2016-06-28 LAB — HEMOGLOBIN A1C: Hgb A1c MFr Bld: 5.8 % (ref 4.6–6.5)

## 2016-06-28 LAB — LIPID PANEL
CHOLESTEROL: 255 mg/dL — AB (ref 0–200)
HDL: 50.2 mg/dL (ref >39.00)
LDL CALC: 175 mg/dL — AB (ref 0–99)
NonHDL: 205.2
TRIGLYCERIDES: 150 mg/dL — AB (ref 0.0–149.0)
Total CHOL/HDL Ratio: 5
VLDL: 30 mg/dL (ref 0.0–40.0)

## 2016-06-28 LAB — BASIC METABOLIC PANEL
BUN: 13 mg/dL (ref 6–23)
CALCIUM: 9.1 mg/dL (ref 8.4–10.5)
CHLORIDE: 102 meq/L (ref 96–112)
CO2: 28 meq/L (ref 19–32)
CREATININE: 0.67 mg/dL (ref 0.40–1.20)
GFR: 92.38 mL/min (ref >60.00)
GLUCOSE: 108 mg/dL — AB (ref 70–99)
Potassium: 4.5 mEq/L (ref 3.5–5.1)
Sodium: 136 mEq/L (ref 135–145)

## 2016-08-16 DIAGNOSIS — E119 Type 2 diabetes mellitus without complications: Secondary | ICD-10-CM | POA: Diagnosis not present

## 2016-08-16 DIAGNOSIS — H26492 Other secondary cataract, left eye: Secondary | ICD-10-CM | POA: Diagnosis not present

## 2016-08-16 DIAGNOSIS — Z961 Presence of intraocular lens: Secondary | ICD-10-CM | POA: Diagnosis not present

## 2016-08-16 DIAGNOSIS — H35343 Macular cyst, hole, or pseudohole, bilateral: Secondary | ICD-10-CM | POA: Diagnosis not present

## 2016-09-03 ENCOUNTER — Encounter: Payer: Self-pay | Admitting: Internal Medicine

## 2016-09-03 ENCOUNTER — Ambulatory Visit (INDEPENDENT_AMBULATORY_CARE_PROVIDER_SITE_OTHER): Payer: Medicare Other | Admitting: Internal Medicine

## 2016-09-03 VITALS — BP 122/84 | HR 94 | Temp 98.8°F | Resp 16 | Wt 131.0 lb

## 2016-09-03 DIAGNOSIS — J069 Acute upper respiratory infection, unspecified: Secondary | ICD-10-CM

## 2016-09-03 MED ORDER — AMOXICILLIN-POT CLAVULANATE 875-125 MG PO TABS: 1.0000 | ORAL_TABLET | Freq: Two times a day (BID) | ORAL | 0 refills | Status: DC

## 2016-09-03 MED ORDER — BENZONATATE 200 MG PO CAPS: 200.0000 mg | ORAL_CAPSULE | Freq: Three times a day (TID) | ORAL | 0 refills | Status: DC | PRN

## 2016-09-03 NOTE — Progress Notes (Signed)
Pre visit review using our clinic review tool, if applicable. No additional management support is needed unless otherwise documented below in the visit note. 

## 2016-09-03 NOTE — Assessment & Plan Note (Signed)
Concern for bacterial infection - also has an infected tooth Start augmentin Continue tessalon perles Can use delsym syrup  Rest, fluids Call if no improvement

## 2016-09-03 NOTE — Patient Instructions (Signed)
Your prescription(s) have been submitted to your pharmacy or been printed and provided for you. Please take as directed and contact our office if you believe you are having problem(s) with the medication(s) or have any questions.  If your symptoms worsen or fail to improve, please contact our office for further instruction, or in case of emergency go directly to the emergency room at the closest medical facility.   General Recommendations:    Please drink plenty of fluids.  Get plenty of rest   Sleep in humidified air  Use saline nasal sprays  Netti pot  OTC Medications:  Decongestants - helps relieve congestion   Flonase (generic fluticasone) or Nasacort (generic triamcinolone) - please make sure to use the "cross-over" technique at a 45 degree angle towards the opposite eye as opposed to straight up the nasal passageway.   Sudafed (generic pseudoephedrine - Note this is the one that is available behind the pharmacy counter); Products with phenylephrine (-PE) Schofield also be used but is often not as effective as pseudoephedrine.   If you have HIGH BLOOD PRESSURE - Coricidin HBP; AVOID any product that is -D as this contains pseudoephedrine which Gainey increase your blood pressure.  Afrin (oxymetazoline) every 6-8 hours for up to 3 days.  Allergies - helps relieve runny nose, itchy eyes and sneezing   Claritin (generic loratidine), Allegra (fexofenidine), or Zyrtec (generic cyrterizine) for runny nose. These medications should not cause drowsiness.  Note - Benadryl (generic diphenhydramine) Taflinger be used however Heath cause drowsiness  Cough -   Delsym or Robitussin (generic dextromethorphan)  Expectorants - helps loosen mucus to ease removal   Mucinex (generic guaifenesin) as directed on the package.  Headaches / General Aches   Tylenol (generic acetaminophen) - DO NOT EXCEED 3 grams (3,000 mg) in a 24 hour time period  Advil/Motrin (generic  ibuprofen)  Sore Throat -   Salt water gargle   Chloraseptic (generic benzocaine) spray or lozenges / Sucrets (generic dyclonine)  

## 2016-09-03 NOTE — Progress Notes (Signed)
Subjective:    Patient ID: Lisa [name], female    DOB: [birth year], 70 y.o.   MRN: [id number]  HPI She is here for an acute visit for cold symptoms.   Her symptoms started last week.   She has had subjective low grade fevers, productive cough, layrntigitis, nasal congestion with discolored mucus, sore throat and she has no appetite.  She has been around multiple sick individuals.  She has takes Tessalon perles drank water, hot tea and lemon and Tussin CF cough syrup.  She is not sleeping.  .   She is taking amoxicillin for an infected tooth. She started the antibiotic over the weekend.  She needs to have the tooth removed.   Medications and allergies reviewed with patient and updated if appropriate.  Patient Active Problem List   Diagnosis Date Noted  . Hip flexor tightness 11/30/2015  . Lumbar radiculopathy 04/29/2014  . Piriformis syndrome of left side 04/20/2014  . IT band syndrome 04/20/2014  . Frequent falls 04/20/2014  . Well adult exam 07/13/2012  . Left lumbar radiculopathy 04/08/2012  . Left leg pain 03/19/2012  . HAND PAIN 08/19/2009  . Vitamin D deficiency 04/30/2008  . Hyperglycemia 04/30/2008  . Dyslipidemia 10/31/2007  . RECENT RETINAL DETACHMENT PARTIAL W/GIANT TEAR 10/31/2007  . Essential hypertension 10/31/2007    Current Outpatient Prescriptions on File Prior to Visit  Medication Sig Dispense Refill  . ascorbic acid (VITAMIN C) 500 MG tablet Take 500 mg by mouth as needed.     . cholecalciferol (VITAMIN D) 1000 UNITS tablet Take 2,000 Units by mouth daily.     . cyanocobalamin 1000 MCG tablet Take 1,000 mcg by mouth daily.    . Glucosamine HCl 500 MG TABS Take by mouth 2 (two) times daily.     Vanessa Kick Ethyl (VASCEPA) 1 g CAPS Take 2 capsules by mouth 2 (two) times daily. 120 capsule 11  . lisinopril-hydrochlorothiazide (PRINZIDE,ZESTORETIC) 10-12.5 MG tablet TAKE 1 TABLET BY MOUTH DAILY. 90 tablet 1  . Misc Natural Products (TART CHERRY ADVANCED)  CAPS Take 1 capsule by mouth at bedtime.    . Turmeric 500 MG TABS Take 2 capsules by mouth 2 (two) times daily.     Current Facility-Administered Medications on File Prior to Visit  Medication Dose Route Frequency Provider Last Rate Last Dose  . methylPREDNISolone acetate (DEPO-MEDROL) injection 40 mg  40 mg Intra-articular Once Cassandria Anger, MD        Past Medical History:  Diagnosis Date  . Cataract 2009   right-Dr. Gershon Crane  . Hyperlipidemia   . Hypertension   . Retinal tear 2008   B- Dr. Charolette Forward  . Vitamin D deficiency     Past Surgical History:  Procedure Laterality Date  . CATARACT EXTRACTION     Dr. Gershon Crane  . RETINAL TEAR REPAIR CRYOTHERAPY     Dr. Charolette Forward    Social History   Social History  . Marital status: Single    Spouse name: N/A  . Number of children: N/A  . Years of education: N/A   Occupational History  . nutritionist    Social History Main Topics  . Smoking status: Never Smoker  . Smokeless tobacco: Never Used  . Alcohol use No  . Drug use: No  . Sexual activity: Not on file   Other Topics Concern  . Not on file   Social History Narrative   Regular exercise- Yes    Family History  Problem Relation Age of  Onset  . COPD Father   . COPD Sister   . Hyperlipidemia Other     Review of Systems  Constitutional: Positive for appetite change (no appetite) and fever (low grade).  HENT: Positive for congestion, sore throat and voice change. Negative for ear pain, sinus pain and sinus pressure.   Respiratory: Positive for cough (productive). Negative for shortness of breath and wheezing.   Cardiovascular: Negative for chest pain.  Gastrointestinal: Negative for abdominal pain, diarrhea and nausea.  Neurological: Negative for light-headedness and headaches.       Objective:   Vitals:   09/03/16 1129  BP: 122/84  Pulse: 94  Resp: 16  Temp: 98.8 F (37.1 C)   Filed Weights   09/03/16 1129  Weight: 131 lb (59.4 kg)   Body mass  index is 24.75 kg/m.   Physical Exam GENERAL APPEARANCE: Appears stated age, mildly ill appearing, NAD EYES: conjunctiva clear, no icterus HEENT: bilateral tympanic membranes and ear canals normal, oropharynx with mild erythema, no thyromegaly, trachea midline, no cervical or supraclavicular lymphadenopathy LUNGS: Clear to auscultation without wheeze or crackles, unlabored breathing, good air entry bilaterally HEART: Normal S1,S2 without murmurs EXTREMITIES: Without clubbing, cyanosis, or edema        Assessment & Plan:   See Problem List for Assessment and Plan of chronic medical problems.

## 2016-09-19 ENCOUNTER — Encounter: Payer: Self-pay | Admitting: Internal Medicine

## 2016-09-19 ENCOUNTER — Ambulatory Visit (INDEPENDENT_AMBULATORY_CARE_PROVIDER_SITE_OTHER): Payer: Medicare Other | Admitting: Internal Medicine

## 2016-09-19 VITALS — BP 140/80 | HR 76 | Temp 98.5°F | Wt 133.0 lb

## 2016-09-19 DIAGNOSIS — J029 Acute pharyngitis, unspecified: Secondary | ICD-10-CM

## 2016-09-19 DIAGNOSIS — J040 Acute laryngitis: Secondary | ICD-10-CM | POA: Diagnosis not present

## 2016-09-19 DIAGNOSIS — I10 Essential (primary) hypertension: Secondary | ICD-10-CM

## 2016-09-19 DIAGNOSIS — J020 Streptococcal pharyngitis: Secondary | ICD-10-CM

## 2016-09-19 LAB — POCT RAPID STREP A (OFFICE): Rapid Strep A Screen: NEGATIVE

## 2016-09-19 MED ORDER — PROMETHAZINE-CODEINE 6.25-10 MG/5ML PO SYRP: 5.0000 mL | ORAL_SOLUTION | ORAL | 0 refills | Status: DC | PRN

## 2016-09-19 MED ORDER — AZITHROMYCIN 250 MG PO TABS: ORAL_TABLET | ORAL | 0 refills | Status: DC

## 2016-09-19 NOTE — Progress Notes (Signed)
Pre visit review using our clinic review tool, if applicable. No additional management support is needed unless otherwise documented below in the visit note. 

## 2016-09-19 NOTE — Patient Instructions (Signed)
Use over-the-counter  "cold" medicines  such as " Delsym" or" Robitussin" cough syrup varietis for cough.  You can use plain "Tylenol" or "Advil" for fever, chills and achyness. Use Halls or Ricola cough drops.  Please, make an appointment if you are not better or if you're worse.  

## 2016-09-19 NOTE — Progress Notes (Signed)
Subjective:  Patient ID: Lisa [name], female    DOB: 10-29-1945  Age: 71 y.o. MRN: [id number]  CC: Sore Throat (since 08/31/16)   HPI Lisa [name] presents for R throat pain x 3 d - can eat liquids only; she is hoarse x1 mo  Outpatient Medications Prior to Visit  Medication Sig Dispense Refill  . ascorbic acid (VITAMIN C) 500 MG tablet Take 500 mg by mouth as needed.     . benzonatate (TESSALON) 200 MG capsule Take 1 capsule (200 mg total) by mouth 3 (three) times daily as needed for cough. 30 capsule 0  . cholecalciferol (VITAMIN D) 1000 UNITS tablet Take 2,000 Units by mouth daily.     . cyanocobalamin 1000 MCG tablet Take 1,000 mcg by mouth daily.    . Glucosamine HCl 500 MG TABS Take by mouth 2 (two) times daily.     Lisa [name] (VASCEPA) 1 g CAPS Take 2 capsules by mouth 2 (two) times daily. 120 capsule 11  . lisinopril-hydrochlorothiazide (PRINZIDE,ZESTORETIC) 10-12.5 MG tablet TAKE 1 TABLET BY MOUTH DAILY. 90 tablet 1  . Misc Natural Products (TART CHERRY ADVANCED) CAPS Take 1 capsule by mouth at bedtime.    . Turmeric 500 MG TABS Take 2 capsules by mouth 2 (two) times daily.    Marland Kitchen amoxicillin-clavulanate (AUGMENTIN) 875-125 MG tablet Take 1 tablet by mouth 2 (two) times daily. 20 tablet 0   Facility-Administered Medications Prior to Visit  Medication Dose Route Frequency Provider Last Rate Last Dose  . methylPREDNISolone acetate (DEPO-MEDROL) injection 40 mg  40 mg Intra-articular Once Cassandria Anger, MD        ROS Review of Systems  Objective:  BP 140/80   Pulse 76   Temp 98.5 F (36.9 C) (Oral)   Wt 133 lb (60.3 kg)   SpO2 97%   BMI 25.13 kg/m   BP Readings from Last 3 Encounters:  09/19/16 140/80  09/03/16 122/84  06/07/16 134/76    Wt Readings from Last 3 Encounters:  09/19/16 133 lb (60.3 kg)  09/03/16 131 lb (59.4 kg)  06/07/16 131 lb (59.4 kg)    Physical Exam  Lab Results  Component Value Date   WBC 5.6 05/02/2015   HGB 14.2  05/02/2015   HCT 42.2 05/02/2015   PLT 266.0 05/02/2015   GLUCOSE 108 (H) 06/28/2016   CHOL 255 (H) 06/28/2016   TRIG 150.0 (H) 06/28/2016   HDL 50.20 06/28/2016   LDLDIRECT 187.0 12/05/2015   LDLCALC 175 (H) 06/28/2016   ALT 22 06/28/2016   AST 18 06/28/2016   NA 136 06/28/2016   K 4.5 06/28/2016   CL 102 06/28/2016   CREATININE 0.67 06/28/2016   BUN 13 06/28/2016   CO2 28 06/28/2016   TSH 0.77 05/02/2015   HGBA1C 5.8 06/28/2016    Mm Diag Breast Tomo Uni Right  Result Date: 06/08/2016 CLINICAL DATA:  71 year old female for further evaluation of possible right breast mass on screening mammogram. EXAM: 2D DIGITAL DIAGNOSTIC UNILATERAL RIGHT MAMMOGRAM WITH CAD AND ADJUNCT TOMO COMPARISON:  Previous mammograms dating back to 2007. ACR Breast Density Category b: There are scattered areas of fibroglandular density. FINDINGS: 2D and 3D full field views of the right breast demonstrate no suspicious mass, distortion or worrisome calcifications. No persistent suspicious abnormalities identified in the area of the screening study finding. On today's images, this area has a similar appearance to 2007 studies. Mammographic images were processed with CAD. IMPRESSION: No persistent suspicious abnormality in  the area of the screening study finding. RECOMMENDATION: Bilateral screening mammograms in 1 year. I have discussed the findings and recommendations with the patient. Results were also provided in writing at the conclusion of the visit. If applicable, a reminder letter will be sent to the patient regarding the next appointment. BI-RADS CATEGORY  1: Negative. Electronically Signed   By: Margarette Canada M.D.   On: 06/08/2016 15:14    Assessment & Plan:   Lisa [name] was seen today for sore throat.  Diagnoses and all orders for this visit:  Streptococcal sore throat -     POCT rapid strep A   I have discontinued Lisa [name]'s amoxicillin-clavulanate. I am also having her maintain her cholecalciferol,  ascorbic acid, cyanocobalamin, Glucosamine HCl, Turmeric, TART CHERRY ADVANCED, Lisa [name], lisinopril-hydrochlorothiazide, and benzonatate. We will continue to administer methylPREDNISolone acetate.  No orders of the defined types were placed in this encounter.    Follow-up: No Follow-up on file.  Walker Kehr, MD

## 2016-09-19 NOTE — Assessment & Plan Note (Signed)
R>>L ENT cons w/Dr Crossly Prom/cod syr

## 2016-09-19 NOTE — Assessment & Plan Note (Signed)
We discussed cough/ACE

## 2016-09-19 NOTE — Assessment & Plan Note (Signed)
Voice rest Zpac

## 2016-11-05 ENCOUNTER — Other Ambulatory Visit: Payer: Self-pay | Admitting: Internal Medicine

## 2016-12-05 ENCOUNTER — Ambulatory Visit: Payer: Medicare Other | Admitting: Internal Medicine

## 2016-12-21 ENCOUNTER — Encounter: Payer: Self-pay | Admitting: Internal Medicine

## 2016-12-21 ENCOUNTER — Ambulatory Visit (INDEPENDENT_AMBULATORY_CARE_PROVIDER_SITE_OTHER): Payer: Medicare Other | Admitting: Internal Medicine

## 2016-12-21 VITALS — BP 118/74 | HR 74 | Temp 97.6°F | Wt 133.0 lb

## 2016-12-21 DIAGNOSIS — I10 Essential (primary) hypertension: Secondary | ICD-10-CM

## 2016-12-21 DIAGNOSIS — G8929 Other chronic pain: Secondary | ICD-10-CM | POA: Diagnosis not present

## 2016-12-21 DIAGNOSIS — M25561 Pain in right knee: Secondary | ICD-10-CM | POA: Diagnosis not present

## 2016-12-21 DIAGNOSIS — E559 Vitamin D deficiency, unspecified: Secondary | ICD-10-CM | POA: Diagnosis not present

## 2016-12-21 DIAGNOSIS — R739 Hyperglycemia, unspecified: Secondary | ICD-10-CM | POA: Diagnosis not present

## 2016-12-21 DIAGNOSIS — E785 Hyperlipidemia, unspecified: Secondary | ICD-10-CM

## 2016-12-21 MED ORDER — ICOSAPENT ETHYL 1 G PO CAPS: 2.0000 | ORAL_CAPSULE | Freq: Two times a day (BID) | ORAL | 11 refills | Status: DC

## 2016-12-21 NOTE — Addendum Note (Signed)
Addended by: Cassandria Anger on: 12/21/2016 09:13 AM   Modules accepted: Orders

## 2016-12-21 NOTE — Assessment & Plan Note (Signed)
Labs

## 2016-12-21 NOTE — Patient Instructions (Signed)
Radon test in the basement

## 2016-12-21 NOTE — Progress Notes (Signed)
Subjective:  Patient ID: Lisa [name], female    DOB: 1946/07/08  Age: 71 y.o. MRN: [id number]  CC: No chief complaint on file.   HPI [name] presents for R knee pain, HTN, dyslipidemia f/u  Outpatient Medications Prior to Visit  Medication Sig Dispense Refill  . ascorbic acid (VITAMIN C) 500 MG tablet Take 500 mg by mouth as needed.     . cholecalciferol (VITAMIN D) 1000 UNITS tablet Take 2,000 Units by mouth daily.     . cyanocobalamin 1000 MCG tablet Take 1,000 mcg by mouth daily.    . Glucosamine HCl 500 MG TABS Take by mouth 2 (two) times daily.     Vanessa Kick Ethyl (VASCEPA) 1 g CAPS Take 2 capsules by mouth 2 (two) times daily. 120 capsule 11  . lisinopril-hydrochlorothiazide (PRINZIDE,ZESTORETIC) 10-12.5 MG tablet TAKE 1 TABLET BY MOUTH DAILY. 90 tablet 2  . Turmeric 500 MG TABS Take 2 capsules by mouth 2 (two) times daily.    Marland Kitchen azithromycin (ZITHROMAX Z-PAK) 250 MG tablet As directed 6 each 0  . benzonatate (TESSALON) 200 MG capsule Take 1 capsule (200 mg total) by mouth 3 (three) times daily as needed for cough. 30 capsule 0  . Misc Natural Products (TART CHERRY ADVANCED) CAPS Take 1 capsule by mouth at bedtime.    . promethazine-codeine (PHENERGAN WITH CODEINE) 6.25-10 MG/5ML syrup Take 5 mLs by mouth every 4 (four) hours as needed. 300 mL 0   Facility-Administered Medications Prior to Visit  Medication Dose Route Frequency Provider Last Rate Last Dose  . methylPREDNISolone acetate (DEPO-MEDROL) injection 40 mg  40 mg Intra-articular Once Imri Lor V, MD        ROS Review of Systems  Constitutional: Negative for activity change, appetite change, chills, fatigue and unexpected weight change.  HENT: Negative for congestion, mouth sores and sinus pressure.   Eyes: Negative for visual disturbance.  Respiratory: Negative for cough and chest tightness.   Gastrointestinal: Negative for abdominal pain and nausea.  Genitourinary: Negative for difficulty  urinating, frequency and vaginal pain.  Musculoskeletal: Positive for arthralgias. Negative for back pain and gait problem.  Skin: Negative for pallor and rash.  Neurological: Negative for dizziness, tremors, weakness, numbness and headaches.  Psychiatric/Behavioral: Negative for confusion, sleep disturbance and suicidal ideas.    Objective:  BP 118/74   Pulse 74   Temp 97.6 F (36.4 C)   Wt 133 lb (60.3 kg)   SpO2 98%   BMI 25.13 kg/m   BP Readings from Last 3 Encounters:  12/21/16 118/74  09/19/16 140/80  09/03/16 122/84    Wt Readings from Last 3 Encounters:  12/21/16 133 lb (60.3 kg)  09/19/16 133 lb (60.3 kg)  09/03/16 131 lb (59.4 kg)    Physical Exam  Constitutional: She appears well-developed. No distress.  HENT:  Head: Normocephalic.  Right Ear: External ear normal.  Left Ear: External ear normal.  Nose: Nose normal.  Mouth/Throat: Oropharynx is clear and moist.  Eyes: Conjunctivae are normal. Pupils are equal, round, and reactive to light. Right eye exhibits no discharge. Left eye exhibits no discharge.  Neck: Normal range of motion. Neck supple. No JVD present. No tracheal deviation present. No thyromegaly present.  Cardiovascular: Normal rate, regular rhythm and normal heart sounds.   Pulmonary/Chest: No stridor. No respiratory distress. She has no wheezes.  Abdominal: Soft. Bowel sounds are normal. She exhibits no distension and no mass. There is no tenderness. There is no rebound and no  guarding.  Musculoskeletal: She exhibits no edema or tenderness.  Lymphadenopathy:    She has no cervical adenopathy.  Neurological: She displays normal reflexes. No cranial nerve deficit. She exhibits normal muscle tone. Coordination normal.  Skin: No rash noted. No erythema.  Psychiatric: She has a normal mood and affect. Her behavior is normal. Judgment and thought content normal.    Lab Results  Component Value Date   WBC 5.6 05/02/2015   HGB 14.2 05/02/2015    HCT 42.2 05/02/2015   PLT 266.0 05/02/2015   GLUCOSE 108 (H) 06/28/2016   CHOL 255 (H) 06/28/2016   TRIG 150.0 (H) 06/28/2016   HDL 50.20 06/28/2016   LDLDIRECT 187.0 12/05/2015   LDLCALC 175 (H) 06/28/2016   ALT 22 06/28/2016   AST 18 06/28/2016   NA 136 06/28/2016   K 4.5 06/28/2016   CL 102 06/28/2016   CREATININE 0.67 06/28/2016   BUN 13 06/28/2016   CO2 28 06/28/2016   TSH 0.77 05/02/2015   HGBA1C 5.8 06/28/2016    Mm Diag Breast Tomo Uni Right  Result Date: 06/08/2016 CLINICAL DATA:  71 year old female for further evaluation of possible right breast mass on screening mammogram. EXAM: 2D DIGITAL DIAGNOSTIC UNILATERAL RIGHT MAMMOGRAM WITH CAD AND ADJUNCT TOMO COMPARISON:  Previous mammograms dating back to 2007. ACR Breast Density Category b: There are scattered areas of fibroglandular density. FINDINGS: 2D and 3D full field views of the right breast demonstrate no suspicious mass, distortion or worrisome calcifications. No persistent suspicious abnormalities identified in the area of the screening study finding. On today's images, this area has a similar appearance to 2007 studies. Mammographic images were processed with CAD. IMPRESSION: No persistent suspicious abnormality in the area of the screening study finding. RECOMMENDATION: Bilateral screening mammograms in 1 year. I have discussed the findings and recommendations with the patient. Results were also provided in writing at the conclusion of the visit. If applicable, a reminder letter will be sent to the patient regarding the next appointment. BI-RADS CATEGORY  1: Negative. Electronically Signed   By: Margarette Canada M.D.   On: 06/08/2016 15:14    Assessment & Plan:   There are no diagnoses linked to this encounter. I have discontinued Ms. Steffey's TART CHERRY ADVANCED, benzonatate, promethazine-codeine, and azithromycin. I am also having her maintain her cholecalciferol, ascorbic acid, cyanocobalamin, Glucosamine HCl, Turmeric,  Icosapent Ethyl, lisinopril-hydrochlorothiazide, and omega-3 acid ethyl esters. We will continue to administer methylPREDNISolone acetate.  Meds ordered this encounter  Medications  . omega-3 acid ethyl esters (LOVAZA) 1 g capsule    Sig: Take by mouth 2 (two) times daily.     Follow-up: No Follow-up on file.  Walker Kehr, MD

## 2016-12-21 NOTE — Assessment & Plan Note (Signed)
f/u w/Dr Tamala Julian

## 2016-12-21 NOTE — Assessment & Plan Note (Signed)
Prinizide 

## 2016-12-21 NOTE — Assessment & Plan Note (Signed)
Vit D 

## 2016-12-25 LAB — FECAL OCCULT BLOOD, IMMUNOCHEMICAL: FECAL OCCULT BLD: NEGATIVE

## 2016-12-28 ENCOUNTER — Telehealth: Payer: Self-pay | Admitting: Internal Medicine

## 2016-12-28 ENCOUNTER — Other Ambulatory Visit (INDEPENDENT_AMBULATORY_CARE_PROVIDER_SITE_OTHER): Payer: Medicare Other

## 2016-12-28 DIAGNOSIS — I10 Essential (primary) hypertension: Secondary | ICD-10-CM | POA: Diagnosis not present

## 2016-12-28 DIAGNOSIS — E785 Hyperlipidemia, unspecified: Secondary | ICD-10-CM

## 2016-12-28 DIAGNOSIS — R739 Hyperglycemia, unspecified: Secondary | ICD-10-CM | POA: Diagnosis not present

## 2016-12-28 LAB — BASIC METABOLIC PANEL
BUN: 14 mg/dL (ref 6–23)
CHLORIDE: 102 meq/L (ref 96–112)
CO2: 29 mEq/L (ref 19–32)
CREATININE: 0.63 mg/dL (ref 0.40–1.20)
Calcium: 9.3 mg/dL (ref 8.4–10.5)
GFR: 99.03 mL/min (ref >60.00)
Glucose, Bld: 106 mg/dL — ABNORMAL HIGH (ref 70–99)
POTASSIUM: 4 meq/L (ref 3.5–5.1)
Sodium: 137 mEq/L (ref 135–145)

## 2016-12-28 LAB — LIPID PANEL
Cholesterol: 246 mg/dL — ABNORMAL HIGH (ref 0–200)
HDL: 53.3 mg/dL (ref >39.00)
LDL Cholesterol: 162 mg/dL — ABNORMAL HIGH (ref 0–99)
NonHDL: 192.87
TRIGLYCERIDES: 153 mg/dL — AB (ref 0.0–149.0)
Total CHOL/HDL Ratio: 5
VLDL: 30.6 mg/dL (ref 0.0–40.0)

## 2016-12-28 LAB — HEMOGLOBIN A1C: HEMOGLOBIN A1C: 5.9 % (ref 4.6–6.5)

## 2016-12-28 NOTE — Telephone Encounter (Signed)
[place], a NP from [facility] called to give the results from a PAD Screen. She said that the left foot was 0.32 (severely decreased blood flow) and the right foot was 0.90 (moderately decreased). A letter will be sent to [place] with this information as well.

## 2016-12-30 NOTE — Progress Notes (Signed)
Dear Ms Covin, Your labs/tests are stable. Your lipids are better a little.. Sincerely, Walker Kehr, MD

## 2016-12-31 NOTE — Telephone Encounter (Signed)
Routing to dr plotnikov, fyi.... 

## 2016-12-31 NOTE — Telephone Encounter (Signed)
Should we order a formal PAD test? Thx

## 2016-12-31 NOTE — Telephone Encounter (Signed)
Per dr Alain Marion, order referral for ultrasound bilateral lower extremities

## 2017-01-04 ENCOUNTER — Telehealth: Payer: Self-pay

## 2017-01-04 DIAGNOSIS — I999 Unspecified disorder of circulatory system: Secondary | ICD-10-CM

## 2017-01-04 NOTE — Telephone Encounter (Signed)
error 

## 2017-01-12 ENCOUNTER — Other Ambulatory Visit: Payer: Self-pay | Admitting: Internal Medicine

## 2017-01-15 ENCOUNTER — Telehealth: Payer: Self-pay

## 2017-01-15 DIAGNOSIS — I999 Unspecified disorder of circulatory system: Secondary | ICD-10-CM

## 2017-01-15 NOTE — Telephone Encounter (Signed)
-----   Message from Octavio Manns sent at 01/15/2017  3:16 PM EDT ----- I called [facility] to see if they could get pt in this week for the artieral duplex. She states they need to start with an ABI to see if a duplex is needed.  Can you please put an order in for ABI?  Thanks Cecille Rubin

## 2017-01-15 NOTE — Telephone Encounter (Signed)
ABI ordered.  

## 2017-01-15 NOTE — Telephone Encounter (Signed)
error 

## 2017-01-16 ENCOUNTER — Ambulatory Visit (HOSPITAL_COMMUNITY)
Admission: RE | Admit: 2017-01-16 | Discharge: 2017-01-16 | Disposition: A | Discharge status: Home / Self Care | Payer: Medicare Other | Source: Ambulatory Visit | Attending: Surgery | Admitting: Surgery

## 2017-01-16 DIAGNOSIS — I999 Unspecified disorder of circulatory system: Secondary | ICD-10-CM | POA: Diagnosis present

## 2017-01-31 ENCOUNTER — Encounter (HOSPITAL_COMMUNITY): Payer: Medicare Other

## 2017-01-31 ENCOUNTER — Encounter: Payer: Self-pay | Admitting: Internal Medicine

## 2017-06-24 ENCOUNTER — Other Ambulatory Visit: Payer: Medicare Other

## 2017-06-25 ENCOUNTER — Encounter: Payer: Self-pay | Admitting: Internal Medicine

## 2017-06-25 ENCOUNTER — Ambulatory Visit (INDEPENDENT_AMBULATORY_CARE_PROVIDER_SITE_OTHER): Payer: Medicare Other | Admitting: Internal Medicine

## 2017-06-25 ENCOUNTER — Ambulatory Visit: Payer: Medicare Other

## 2017-06-25 VITALS — BP 128/72 | HR 71 | Temp 98.4°F | Ht 61.0 in | Wt 130.0 lb

## 2017-06-25 DIAGNOSIS — Z1231 Encounter for screening mammogram for malignant neoplasm of breast: Secondary | ICD-10-CM

## 2017-06-25 DIAGNOSIS — M5416 Radiculopathy, lumbar region: Secondary | ICD-10-CM

## 2017-06-25 DIAGNOSIS — I10 Essential (primary) hypertension: Secondary | ICD-10-CM | POA: Diagnosis not present

## 2017-06-25 DIAGNOSIS — E2839 Other primary ovarian failure: Secondary | ICD-10-CM

## 2017-06-25 DIAGNOSIS — Z1211 Encounter for screening for malignant neoplasm of colon: Secondary | ICD-10-CM

## 2017-06-25 DIAGNOSIS — R739 Hyperglycemia, unspecified: Secondary | ICD-10-CM | POA: Diagnosis not present

## 2017-06-25 DIAGNOSIS — G8929 Other chronic pain: Secondary | ICD-10-CM

## 2017-06-25 DIAGNOSIS — E559 Vitamin D deficiency, unspecified: Secondary | ICD-10-CM

## 2017-06-25 DIAGNOSIS — M25561 Pain in right knee: Secondary | ICD-10-CM

## 2017-06-25 DIAGNOSIS — Z0000 Encounter for general adult medical examination without abnormal findings: Secondary | ICD-10-CM

## 2017-06-25 DIAGNOSIS — Z1159 Encounter for screening for other viral diseases: Secondary | ICD-10-CM

## 2017-06-25 DIAGNOSIS — E785 Hyperlipidemia, unspecified: Secondary | ICD-10-CM

## 2017-06-25 DIAGNOSIS — Z9189 Other specified personal risk factors, not elsewhere classified: Secondary | ICD-10-CM

## 2017-06-25 MED ORDER — ZOSTER VAC RECOMB ADJUVANTED 50 MCG/0.5ML IM SUSR: 0.5000 mL | Freq: Once | INTRAMUSCULAR | 1 refills | Status: AC

## 2017-06-25 NOTE — Assessment & Plan Note (Signed)
Rare Ibuprofen prn

## 2017-06-25 NOTE — Assessment & Plan Note (Signed)
Ibuprofen prn 

## 2017-06-25 NOTE — Assessment & Plan Note (Signed)
Prinizide 

## 2017-06-25 NOTE — Patient Instructions (Addendum)
Continue doing brain stimulating activities (puzzles, reading, adult coloring books, staying active) to keep memory sharp.   Continue to eat heart healthy diet (full of fruits, vegetables, whole grains, lean protein, water--limit salt, fat, and sugar intake) and increase physical activity as tolerated.  Memorama, Lumosity, Elevate, are memory game apps, Web-site for free memory games: http://games.[employer].dk   Ms. Wandersee , Thank you for taking time to come for your Medicare Wellness Visit. I appreciate your ongoing commitment to your health goals. Please review the following plan we discussed and let me know if I can assist you in the future.   These are the goals we discussed: Goals    . Maintain current health          Be as healthy and as independent as possible, increase muscle strength, perhaps work with a trainer to increase muscle strength or join [employer].        This is a list of the screening recommended for you and due dates:  Health Maintenance  Topic Date Due  .  Hepatitis C: One time screening is recommended by Center for Disease Control  (CDC) for  adults born from [birth year] through 1965.   1946/06/27  . DEXA scan (bone density measurement)  01/30/2011  . Colon Cancer Screening  04/13/2012  . Mammogram  05/30/2018  . Tetanus Vaccine  10/15/2023  . Flu Shot  Completed  . Pneumonia vaccines  Completed

## 2017-06-25 NOTE — Assessment & Plan Note (Signed)
Here for medicare wellness/physical  Diet: heart healthy  Physical activity: not sedentary  Depression/mood screen: negative  Hearing: intact to whispered voice  Visual acuity: grossly normal w/glasses, performs annual eye exam  ADLs: capable  Fall risk: low  Home safety: good  Cognitive evaluation: intact to orientation, naming, recall and repetition  EOL planning: adv directives, full code/ I agree  I have personally reviewed and have noted  1. The patient's medical, surgical and social history  2. Their use of alcohol, tobacco or illicit drugs  3. Their current medications and supplements  4. The patient's functional ability including ADL's, fall risks, home safety risks and hearing or visual impairment.  5. Diet and physical activities  6. Evidence for depression or mood disorders 7. The roster of all physicians providing medical care to patient - is listed in the Snapshot section of the chart and reviewed today.    Today patient counseled on age appropriate routine health concerns for screening and prevention, each reviewed and up to date or declined. Immunizations reviewed and up to date or declined. Labs ordered and reviewed. Risk factors for depression reviewed and negative. Hearing function and visual acuity are intact. ADLs screened and addressed as needed. Functional ability and level of safety reviewed and appropriate. Education, counseling and referrals performed based on assessed risks today. Patient provided with a copy of personalized plan for preventive services.      

## 2017-06-25 NOTE — Assessment & Plan Note (Signed)
A1c

## 2017-06-25 NOTE — Assessment & Plan Note (Signed)
On Vit D 

## 2017-06-25 NOTE — Assessment & Plan Note (Signed)
Labs

## 2017-06-25 NOTE — Progress Notes (Signed)
Pre visit review using our clinic review tool, if applicable. No additional management support is needed unless otherwise documented below in the visit note. 

## 2017-06-25 NOTE — Progress Notes (Signed)
Subjective:  Patient ID: Lisa [name], female    DOB: 05/24/1946  Age: 71 y.o. MRN: [id number]  CC: No chief complaint on file.   HPI Lisa [name] presents for a well exam. F/u HTN, dyslipidemia, OA f/u  Outpatient Medications Prior to Visit  Medication Sig Dispense Refill  . ascorbic acid (VITAMIN C) 500 MG tablet Take 500 mg by mouth as needed.     . cholecalciferol (VITAMIN D) 1000 UNITS tablet Take 2,000 Units by mouth daily.     . cyanocobalamin 1000 MCG tablet Take 1,000 mcg by mouth daily.    . Glucosamine HCl 500 MG TABS Take by mouth 2 (two) times daily.     Marland Kitchen lisinopril-hydrochlorothiazide (PRINZIDE,ZESTORETIC) 10-12.5 MG tablet TAKE 1 TABLET BY MOUTH DAILY. 90 tablet 2  . Turmeric 500 MG TABS Take 2 capsules by mouth 2 (two) times daily.    Marland Kitchen VASCEPA 1 g CAPS TAKE 2 CAPSULES BY MOUTH TWICE A DAY 120 capsule 11   Facility-Administered Medications Prior to Visit  Medication Dose Route Frequency Provider Last Rate Last Dose  . methylPREDNISolone acetate (DEPO-MEDROL) injection 40 mg  40 mg Intra-articular Once Yunis Voorheis, [name], MD        ROS Review of Systems  Constitutional: Negative for activity change, appetite change, chills, fatigue and unexpected weight change.  HENT: Negative for congestion, mouth sores and sinus pressure.   Eyes: Negative for visual disturbance.  Respiratory: Negative for cough and chest tightness.   Gastrointestinal: Negative for abdominal pain and nausea.  Genitourinary: Negative for difficulty urinating, frequency and vaginal pain.  Musculoskeletal: Positive for arthralgias. Negative for back pain and gait problem.  Skin: Negative for pallor and rash.  Neurological: Negative for dizziness, tremors, weakness, numbness and headaches.  Psychiatric/Behavioral: Negative for confusion and sleep disturbance.    Objective:  BP 128/72 (BP Location: Left Arm, Patient Position: Sitting, Cuff Size: Normal)   Pulse 71   Temp 98.4 F (36.9 C)  (Oral)   Ht 5\' 1"  (1.549 m)   Wt 130 lb (59 kg)   SpO2 98%   BMI 24.56 kg/m   BP Readings from Last 3 Encounters:  06/25/17 128/72  12/21/16 118/74  09/19/16 140/80    Wt Readings from Last 3 Encounters:  06/25/17 130 lb (59 kg)  12/21/16 133 lb (60.3 kg)  09/19/16 133 lb (60.3 kg)    Physical Exam  Constitutional: She appears well-developed. No distress.  HENT:  Head: Normocephalic.  Right Ear: External ear normal.  Left Ear: External ear normal.  Nose: Nose normal.  Mouth/Throat: Oropharynx is clear and moist.  Eyes: Pupils are equal, round, and reactive to light. Conjunctivae are normal. Right eye exhibits no discharge. Left eye exhibits no discharge.  Neck: Normal range of motion. Neck supple. No JVD present. No tracheal deviation present. No thyromegaly present.  Cardiovascular: Normal rate, regular rhythm and normal heart sounds.   Pulmonary/Chest: No stridor. No respiratory distress. She has no wheezes.  Abdominal: Soft. Bowel sounds are normal. She exhibits no distension and no mass. There is no tenderness. There is no rebound and no guarding.  Musculoskeletal: She exhibits no edema or tenderness.  Lymphadenopathy:    She has no cervical adenopathy.  Neurological: She displays normal reflexes. No cranial nerve deficit. She exhibits normal muscle tone. Coordination normal.  Skin: No rash noted. No erythema.  Psychiatric: She has a normal mood and affect. Her behavior is normal. Judgment and thought content normal.    Lab  Results  Component Value Date   WBC 5.6 05/02/2015   HGB 14.2 05/02/2015   HCT 42.2 05/02/2015   PLT 266.0 05/02/2015   GLUCOSE 106 (H) 12/28/2016   CHOL 246 (H) 12/28/2016   TRIG 153.0 (H) 12/28/2016   HDL 53.30 12/28/2016   LDLDIRECT 187.0 12/05/2015   LDLCALC 162 (H) 12/28/2016   ALT 22 06/28/2016   AST 18 06/28/2016   NA 137 12/28/2016   K 4.0 12/28/2016   CL 102 12/28/2016   CREATININE 0.63 12/28/2016   BUN 14 12/28/2016   CO2 29  12/28/2016   TSH 0.77 05/02/2015   HGBA1C 5.9 12/28/2016    No results found.  Assessment & Plan:   There are no diagnoses linked to this encounter. I am having [name] maintain her cholecalciferol, ascorbic acid, cyanocobalamin, Glucosamine HCl, Turmeric, lisinopril-hydrochlorothiazide, VASCEPA, and ibuprofen. We will continue to administer methylPREDNISolone acetate.  Meds ordered this encounter  Medications  . ibuprofen (ADVIL,MOTRIN) 100 MG tablet    Sig: Take 100 mg by mouth every 6 (six) hours as needed for pain.     Follow-up: No Follow-up on file.  Walker Kehr, MD

## 2017-06-25 NOTE — Progress Notes (Signed)
Subjective:   Lisa [name] is a 71 y.o. female who presents for Medicare Annual (Subsequent) preventive examination.  Review of Systems:  No ROS.  Medicare Wellness Visit. Additional risk factors are reflected in the social history.  Cardiac Risk Factors include: advanced age (>70men, >61 women);hypertension;dyslipidemia  Sleep patterns: feels rested on waking, gets up 1 times nightly to void and sleeps 6-7 hours nightly.    Home Safety/Smoke Alarms: Feels safe in home. Smoke alarms in place.  Living environment; residence and Firearm Safety: 2-story house, no firearms. Lives alone, no needs for DME, good support system Seat Belt Safety/Bike Helmet: Wears seat belt.    Objective:     Vitals: BP 128/72 (BP Location: Left Arm, Patient Position: Sitting, Cuff Size: Normal)   Pulse 71   Temp 98.4 F (36.9 C) (Oral)   Ht 5\' 1"  (1.549 m)   Wt 130 lb (59 kg)   SpO2 98%   BMI 24.56 kg/m   Body mass index is 24.56 kg/m.   Tobacco History  Smoking Status  . Never Smoker  Smokeless Tobacco  . Never Used     Counseling given: Not Answered   Past Medical History:  Diagnosis Date  . Cataract 2009   right-Dr. Gershon Crane  . Hyperlipidemia   . Hypertension   . Retinal tear 2008   B- Dr. Charolette Forward  . Vitamin D deficiency    Past Surgical History:  Procedure Laterality Date  . CATARACT EXTRACTION     Dr. Gershon Crane  . RETINAL TEAR REPAIR CRYOTHERAPY     Dr. Charolette Forward   Family History  Problem Relation Age of Onset  . COPD Father   . COPD Sister   . Hyperlipidemia Other    History  Sexual Activity  . Sexual activity: Not on file    Outpatient Encounter Prescriptions as of 06/25/2017  Medication Sig  . ascorbic acid (VITAMIN C) 500 MG tablet Take 500 mg by mouth as needed.   . cholecalciferol (VITAMIN D) 1000 UNITS tablet Take 2,000 Units by mouth daily.   . cyanocobalamin 1000 MCG tablet Take 1,000 mcg by mouth daily.  . Glucosamine HCl 500 MG TABS Take by mouth 2  (two) times daily.   Marland Kitchen ibuprofen (ADVIL,MOTRIN) 100 MG tablet Take 100 mg by mouth every 6 (six) hours as needed for pain.  Marland Kitchen lisinopril-hydrochlorothiazide (PRINZIDE,ZESTORETIC) 10-12.5 MG tablet TAKE 1 TABLET BY MOUTH DAILY.  . Turmeric 500 MG TABS Take 2 capsules by mouth 2 (two) times daily.  Marland Kitchen VASCEPA 1 g CAPS TAKE 2 CAPSULES BY MOUTH TWICE A DAY  . Zoster Vac Recomb Adjuvanted [facility]) injection Inject 0.5 mLs into the muscle once. Repeat in 6 months   Facility-Administered Encounter Medications as of 06/25/2017  Medication  . methylPREDNISolone acetate (DEPO-MEDROL) injection 40 mg    Activities of Daily Living In your present state of health, do you have any difficulty performing the following activities: 06/25/2017  Hearing? N  Vision? N  Difficulty concentrating or making decisions? N  Walking or climbing stairs? N  Dressing or bathing? N  Doing errands, shopping? N  Preparing Food and eating ? N  Using the Toilet? N  In the past six months, have you accidently leaked urine? N  Do you have problems with loss of bowel control? N  Managing your Medications? N  Managing your Finances? N  Housekeeping or managing your Housekeeping? N  Some recent data might be hidden    Patient Care Team: [name], [name],  MD as PCP - General Newman Pies, MD (Neurosurgery)    Assessment:    Physical assessment deferred to PCP.  Exercise Activities and Dietary recommendations Current Exercise Habits: Home exercise routine, Type of exercise: walking, Time (Minutes): 50, Frequency (Times/Week): 6, Weekly Exercise (Minutes/Week): 300, Intensity: Mild, Exercise limited by: orthopedic condition(s)  Diet (meal preparation, eat out, water intake, caffeinated beverages, dairy products, fruits and vegetables): in general, a "healthy" diet  , well balanced, eats a variety of fruits and vegetables daily, limits salt, fat/cholesterol, sugar, caffeine, drinks 6-8 glasses of water  daily.  Goals    . Maintain current health          Be as healthy and as independent as possible, increase muscle strength, perhaps work with a trainer to increase muscle strength or join [employer].       Fall Risk Fall Risk  06/25/2017 12/07/2015 11/02/2014  Falls in the past year? No No Yes  Number falls in past yr: - - 2 or more  Injury with Fall? - - Yes  Risk for fall due to : - - Other (Comment);Impaired balance/gait   Depression Screen PHQ 2/9 Scores 06/25/2017 12/07/2015 11/02/2014  PHQ - 2 Score 0 0 0     Cognitive Function MMSE - Mini Mental State Exam 06/25/2017  Orientation to time 5  Orientation to Place 5  Registration 3  Attention/ Calculation 5  Recall 3  Language- name 2 objects 2  Language- repeat 1  Language- follow 3 step command 3  Language- read & follow direction 1  Write a sentence 1  Copy design 1  Total score 30        Immunization History  Administered Date(s) Administered  . Influenza Split 07/11/2012  . Influenza, High Dose Seasonal PF 06/20/2017  . Influenza,inj,Quad PF,6+ Mos 05/28/2016  . Influenza-Unspecified 10/14/2013, 06/17/2014, 06/18/2015  . Pneumococcal Conjugate-13 09/17/2008  . Pneumococcal Polysaccharide-23 11/02/2014  . Td 10/14/2013  . Zoster 03/10/2010   Screening Tests Health Maintenance  Topic Date Due  . Hepatitis C Screening  1946-02-09  . DEXA SCAN  01/30/2011  . COLONOSCOPY  04/13/2012  . MAMMOGRAM  05/30/2018  . TETANUS/TDAP  10/15/2023  . INFLUENZA VACCINE  Completed  . PNA vac Low Risk Adult  Completed      Plan:    Continue doing brain stimulating activities (puzzles, reading, adult coloring books, staying active) to keep memory sharp.   Continue to eat heart healthy diet (full of fruits, vegetables, whole grains, lean protein, water--limit salt, fat, and sugar intake) and increase physical activity as tolerated.  I have personally reviewed and noted the following in the patient's chart:    . Medical and social history . Use of alcohol, tobacco or illicit drugs  . Current medications and supplements . Functional ability and status . Nutritional status . Physical activity . Advanced directives . List of other physicians . Vitals . Screenings to include cognitive, depression, and falls . Referrals and appointments  In addition, I have reviewed and discussed with patient certain preventive protocols, quality metrics, and best practice recommendations. A written personalized care plan for preventive services as well as general preventive health recommendations were provided to patient.     Michiel Cowboy, RN  06/25/2017   Medical screening examination/treatment/procedure(s) were performed by non-physician practitioner and as supervising physician I was immediately available for consultation/collaboration. I agree with above. Lew Dawes, MD

## 2017-06-26 ENCOUNTER — Other Ambulatory Visit (INDEPENDENT_AMBULATORY_CARE_PROVIDER_SITE_OTHER): Payer: Medicare Other

## 2017-06-26 DIAGNOSIS — Z1159 Encounter for screening for other viral diseases: Secondary | ICD-10-CM

## 2017-06-26 DIAGNOSIS — I10 Essential (primary) hypertension: Secondary | ICD-10-CM | POA: Diagnosis not present

## 2017-06-26 DIAGNOSIS — R739 Hyperglycemia, unspecified: Secondary | ICD-10-CM

## 2017-06-26 DIAGNOSIS — E785 Hyperlipidemia, unspecified: Secondary | ICD-10-CM

## 2017-06-26 DIAGNOSIS — Z9189 Other specified personal risk factors, not elsewhere classified: Secondary | ICD-10-CM

## 2017-06-26 LAB — LIPID PANEL
Cholesterol: 257 mg/dL — ABNORMAL HIGH (ref 0–200)
HDL: 52.2 mg/dL (ref >39.00)
NonHDL: 205.08
TRIGLYCERIDES: 235 mg/dL — AB (ref 0.0–149.0)
Total CHOL/HDL Ratio: 5
VLDL: 47 mg/dL — ABNORMAL HIGH (ref 0.0–40.0)

## 2017-06-26 LAB — HEMOGLOBIN A1C: Hgb A1c MFr Bld: 5.9 % (ref 4.6–6.5)

## 2017-06-26 LAB — BASIC METABOLIC PANEL
BUN: 11 mg/dL (ref 6–23)
CALCIUM: 9.6 mg/dL (ref 8.4–10.5)
CO2: 29 mEq/L (ref 19–32)
Chloride: 101 mEq/L (ref 96–112)
Creatinine, Ser: 0.66 mg/dL (ref 0.40–1.20)
GFR: 93.73 mL/min (ref >60.00)
GLUCOSE: 110 mg/dL — AB (ref 70–99)
POTASSIUM: 4.2 meq/L (ref 3.5–5.1)
SODIUM: 138 meq/L (ref 135–145)

## 2017-06-26 LAB — LDL CHOLESTEROL, DIRECT: Direct LDL: 170 mg/dL

## 2017-06-27 LAB — HEPATITIS C ANTIBODY
HEP C AB: NONREACTIVE
SIGNAL TO CUT-OFF: 0.01 (ref <1.00)

## 2017-07-01 ENCOUNTER — Other Ambulatory Visit: Payer: Self-pay | Admitting: Internal Medicine

## 2017-07-01 DIAGNOSIS — Z1231 Encounter for screening mammogram for malignant neoplasm of breast: Secondary | ICD-10-CM

## 2017-07-23 ENCOUNTER — Encounter: Payer: Self-pay | Admitting: Nurse Practitioner

## 2017-07-23 ENCOUNTER — Ambulatory Visit: Payer: Medicare Other | Admitting: Nurse Practitioner

## 2017-07-23 VITALS — BP 130/68 | HR 67 | Temp 97.6°F | Ht 61.0 in | Wt 130.0 lb

## 2017-07-23 DIAGNOSIS — N390 Urinary tract infection, site not specified: Secondary | ICD-10-CM

## 2017-07-23 LAB — POCT URINALYSIS DIPSTICK
Bilirubin, UA: NEGATIVE
Glucose, UA: NEGATIVE
KETONES UA: NEGATIVE
Nitrite, UA: NEGATIVE
PH UA: 6 (ref 5.0–8.0)
Protein, UA: 15
Urobilinogen, UA: 0.2 E.U./dL

## 2017-07-23 MED ORDER — PHENAZOPYRIDINE HCL 100 MG PO TABS: 100.0000 mg | ORAL_TABLET | Freq: Three times a day (TID) | ORAL | 0 refills | Status: DC | PRN

## 2017-07-23 MED ORDER — CIPROFLOXACIN HCL 500 MG PO TABS: 500.0000 mg | ORAL_TABLET | Freq: Two times a day (BID) | ORAL | 0 refills | Status: DC

## 2017-07-23 NOTE — Progress Notes (Signed)
Subjective:  Patient ID: Lisa [name], female    DOB: [birth year]  Age: 71 y.o. MRN: [id number]  CC: Urinary Tract Infection (abd cramps,burning,chill at night--going on for 2 days. )   Urinary Tract Infection   This is a new problem. The current episode started in the past 7 days. The problem occurs every urination. The problem has been gradually worsening. The quality of the pain is described as burning. The pain is moderate. There has been no fever. She is not sexually active. There is no history of pyelonephritis. Associated symptoms include chills, frequency and urgency. Pertinent negatives include no discharge, flank pain, hematuria, hesitancy, nausea, possible pregnancy or vomiting. She has tried increased fluids for the symptoms. The treatment provided no relief. Her past medical history is significant for urinary stasis. There is no history of catheterization or recurrent UTIs.    Outpatient Medications Prior to Visit  Medication Sig Dispense Refill  . ascorbic acid (VITAMIN C) 500 MG tablet Take 500 mg by mouth as needed.     . cholecalciferol (VITAMIN D) 1000 UNITS tablet Take 2,000 Units by mouth daily.     . cyanocobalamin 1000 MCG tablet Take 1,000 mcg by mouth daily.    . Glucosamine HCl 500 MG TABS Take by mouth 2 (two) times daily.     Marland Kitchen ibuprofen (ADVIL,MOTRIN) 100 MG tablet Take 100 mg by mouth every 6 (six) hours as needed for pain.    Marland Kitchen lisinopril-hydrochlorothiazide (PRINZIDE,ZESTORETIC) 10-12.5 MG tablet TAKE 1 TABLET BY MOUTH DAILY. 90 tablet 2  . Turmeric 500 MG TABS Take 2 capsules by mouth 2 (two) times daily.    Marland Kitchen VASCEPA 1 g CAPS TAKE 2 CAPSULES BY MOUTH TWICE A DAY 120 capsule 11   Facility-Administered Medications Prior to Visit  Medication Dose Route Frequency Provider Last Rate Last Dose  . methylPREDNISolone acetate (DEPO-MEDROL) injection 40 mg  40 mg Intra-articular Once [name], Aleksei V, MD        ROS See HPI  Objective:  BP 130/68   Pulse 67    Temp 97.6 F (36.4 C)   Ht 5\' 1"  (1.549 m)   Wt 130 lb (59 kg)   SpO2 97%   BMI 24.56 kg/m   BP Readings from Last 3 Encounters:  07/23/17 130/68  06/25/17 128/72  12/21/16 118/74    Wt Readings from Last 3 Encounters:  07/23/17 130 lb (59 kg)  06/25/17 130 lb (59 kg)  12/21/16 133 lb (60.3 kg)    Physical Exam  Constitutional: She is oriented to person, place, and time. No distress.  Cardiovascular: Normal rate.  Pulmonary/Chest: Effort normal.  Abdominal: Soft. She exhibits no distension. There is tenderness.  Neurological: She is alert and oriented to person, place, and time.  Skin: Skin is warm and dry.  Psychiatric: She has a normal mood and affect. Her behavior is normal.  Vitals reviewed.   Lab Results  Component Value Date   WBC 5.6 05/02/2015   HGB 14.2 05/02/2015   HCT 42.2 05/02/2015   PLT 266.0 05/02/2015   GLUCOSE 110 (H) 06/26/2017   CHOL 257 (H) 06/26/2017   TRIG 235.0 (H) 06/26/2017   HDL 52.20 06/26/2017   LDLDIRECT 170.0 06/26/2017   LDLCALC 162 (H) 12/28/2016   ALT 22 06/28/2016   AST 18 06/28/2016   NA 138 06/26/2017   K 4.2 06/26/2017   CL 101 06/26/2017   CREATININE 0.66 06/26/2017   BUN 11 06/26/2017   CO2 29 06/26/2017  TSH 0.77 05/02/2015   HGBA1C 5.9 06/26/2017    No results found.  Assessment & Plan:   Lisa [name] was seen today for urinary tract infection.  Diagnoses and all orders for this visit:  UTI (urinary tract infection), uncomplicated -     POCT urinalysis dipstick -     ciprofloxacin (CIPRO) 500 MG tablet; Take 1 tablet (500 mg total) 2 (two) times daily by mouth. -     phenazopyridine (PYRIDIUM) 100 MG tablet; Take 1 tablet (100 mg total) 3 (three) times daily as needed by mouth for pain (with food).   I am having Lisa [name] start on ciprofloxacin and phenazopyridine. I am also having her maintain her cholecalciferol, ascorbic acid, cyanocobalamin, Glucosamine HCl, Turmeric,  lisinopril-hydrochlorothiazide, VASCEPA, and ibuprofen. We will continue to administer methylPREDNISolone acetate.  Meds ordered this encounter  Medications  . ciprofloxacin (CIPRO) 500 MG tablet    Sig: Take 1 tablet (500 mg total) 2 (two) times daily by mouth.    Dispense:  10 tablet    Refill:  0    Order Specific Question:   Supervising Provider    Answer:   Cassandria Anger [[id number]]  . phenazopyridine (PYRIDIUM) 100 MG tablet    Sig: Take 1 tablet (100 mg total) 3 (three) times daily as needed by mouth for pain (with food).    Dispense:  10 tablet    Refill:  0    Order Specific Question:   Supervising Provider    Answer:   Cassandria Anger [[id number]]    Follow-up: Return if symptoms worsen or fail to improve.  Wilfred Lacy, NP

## 2017-07-23 NOTE — Patient Instructions (Signed)

## 2017-07-24 ENCOUNTER — Ambulatory Visit
Admission: RE | Admit: 2017-07-24 | Discharge: 2017-07-24 | Disposition: A | Discharge status: Home / Self Care | Payer: Medicare Other | Source: Ambulatory Visit | Attending: Internal Medicine | Admitting: Internal Medicine

## 2017-07-24 DIAGNOSIS — E2839 Other primary ovarian failure: Secondary | ICD-10-CM

## 2017-07-24 DIAGNOSIS — Z1231 Encounter for screening mammogram for malignant neoplasm of breast: Secondary | ICD-10-CM

## 2017-07-28 ENCOUNTER — Other Ambulatory Visit: Payer: Self-pay | Admitting: Internal Medicine

## 2017-08-13 ENCOUNTER — Ambulatory Visit (AMBULATORY_SURGERY_CENTER): Payer: Self-pay | Admitting: *Deleted

## 2017-08-13 ENCOUNTER — Other Ambulatory Visit: Payer: Self-pay

## 2017-08-13 VITALS — Ht 61.0 in | Wt 126.0 lb

## 2017-08-13 DIAGNOSIS — Z1211 Encounter for screening for malignant neoplasm of colon: Secondary | ICD-10-CM

## 2017-08-13 MED ORDER — NA SULFATE-K SULFATE-MG SULF 17.5-3.13-1.6 GM/177ML PO SOLN: ORAL | 0 refills | Status: DC

## 2017-08-13 NOTE — Progress Notes (Signed)
Patient denies any allergies to eggs or soy. Patient has post op vomiting with anesthesia. Patient denies any oxygen use at home. Patient denies taking any diet/weight loss medications or blood thinners. EMMI education assisgned to patient on colonoscopy, this was explained and instructions given to patient.

## 2017-08-14 ENCOUNTER — Encounter: Payer: Self-pay | Admitting: Internal Medicine

## 2017-08-27 ENCOUNTER — Encounter: Payer: Medicare Other | Admitting: Internal Medicine

## 2017-12-20 ENCOUNTER — Other Ambulatory Visit (INDEPENDENT_AMBULATORY_CARE_PROVIDER_SITE_OTHER): Payer: Medicare Other

## 2017-12-20 DIAGNOSIS — R739 Hyperglycemia, unspecified: Secondary | ICD-10-CM

## 2017-12-20 DIAGNOSIS — I10 Essential (primary) hypertension: Secondary | ICD-10-CM | POA: Diagnosis not present

## 2017-12-20 DIAGNOSIS — E785 Hyperlipidemia, unspecified: Secondary | ICD-10-CM | POA: Diagnosis not present

## 2017-12-20 LAB — BASIC METABOLIC PANEL
BUN: 13 mg/dL (ref 6–23)
CHLORIDE: 102 meq/L (ref 96–112)
CO2: 28 mEq/L (ref 19–32)
CREATININE: 0.58 mg/dL (ref 0.40–1.20)
Calcium: 9 mg/dL (ref 8.4–10.5)
GFR: 108.65 mL/min (ref >60.00)
Glucose, Bld: 101 mg/dL — ABNORMAL HIGH (ref 70–99)
Potassium: 4.4 mEq/L (ref 3.5–5.1)
Sodium: 137 mEq/L (ref 135–145)

## 2017-12-20 LAB — LIPID PANEL
Cholesterol: 216 mg/dL — ABNORMAL HIGH (ref 0–200)
HDL: 54.8 mg/dL (ref >39.00)
LDL Cholesterol: 142 mg/dL — ABNORMAL HIGH (ref 0–99)
NONHDL: 161.67
Total CHOL/HDL Ratio: 4
Triglycerides: 100 mg/dL (ref 0.0–149.0)
VLDL: 20 mg/dL (ref 0.0–40.0)

## 2017-12-20 LAB — HEMOGLOBIN A1C: HEMOGLOBIN A1C: 5.9 % (ref 4.6–6.5)

## 2017-12-24 ENCOUNTER — Ambulatory Visit: Payer: Medicare Other | Admitting: Internal Medicine

## 2017-12-24 ENCOUNTER — Encounter: Payer: Self-pay | Admitting: Internal Medicine

## 2017-12-24 DIAGNOSIS — E119 Type 2 diabetes mellitus without complications: Secondary | ICD-10-CM

## 2017-12-24 DIAGNOSIS — I10 Essential (primary) hypertension: Secondary | ICD-10-CM | POA: Diagnosis not present

## 2017-12-24 DIAGNOSIS — E785 Hyperlipidemia, unspecified: Secondary | ICD-10-CM

## 2017-12-24 DIAGNOSIS — E559 Vitamin D deficiency, unspecified: Secondary | ICD-10-CM | POA: Diagnosis not present

## 2017-12-24 NOTE — Assessment & Plan Note (Signed)
Vascepa Labs are better

## 2017-12-24 NOTE — Assessment & Plan Note (Signed)
Chronic refractory Prinizide

## 2017-12-24 NOTE — Progress Notes (Signed)
Subjective:  Patient ID: Lisa [name], female    DOB: [birth year]  Age: 72 y.o. MRN: [id number]  CC: No chief complaint on file.   HPI Briarrose Shor Brailsford presents for dyslipidemia C/o L ankle sprain in Dec 2018  Outpatient Medications Prior to Visit  Medication Sig Dispense Refill  . ascorbic acid (VITAMIN C) 500 MG tablet Take 500 mg by mouth as needed.     . cholecalciferol (VITAMIN D) 1000 UNITS tablet Take 2,000 Units by mouth daily.     . cyanocobalamin 1000 MCG tablet Take 1,000 mcg by mouth daily.    . Glucosamine HCl 500 MG TABS Take by mouth 2 (two) times daily.     Marland Kitchen ibuprofen (ADVIL,MOTRIN) 100 MG tablet Take 100 mg by mouth every 6 (six) hours as needed for pain.    Marland Kitchen lisinopril-hydrochlorothiazide (PRINZIDE,ZESTORETIC) 10-12.5 MG tablet TAKE 1 TABLET BY MOUTH DAILY. 90 tablet 2  . Na Sulfate-K Sulfate-Mg Sulf 17.5-3.13-1.6 GM/177ML SOLN Suprep (no substitutions)-TAKE AS DIRECTED. 354 mL 0  . Turmeric 500 MG TABS Take 2 capsules by mouth 2 (two) times daily.    Marland Kitchen VASCEPA 1 g CAPS TAKE 2 CAPSULES BY MOUTH TWICE A DAY 120 capsule 11   No facility-administered medications prior to visit.     ROS Review of Systems  Constitutional: Negative for activity change, appetite change, chills, fatigue and unexpected weight change.  HENT: Negative for congestion, mouth sores and sinus pressure.   Eyes: Negative for visual disturbance.  Respiratory: Negative for cough and chest tightness.   Gastrointestinal: Negative for abdominal pain and nausea.  Genitourinary: Negative for difficulty urinating, frequency and vaginal pain.  Musculoskeletal: Positive for gait problem. Negative for back pain.  Skin: Negative for pallor and rash.  Neurological: Negative for dizziness, tremors, weakness, numbness and headaches.  Psychiatric/Behavioral: Negative for confusion and sleep disturbance.    Objective:  BP 126/78 (BP Location: Left Arm, Patient Position: Sitting, Cuff Size: Normal)   Pulse  63   Temp 98 F (36.7 C) (Oral)   Ht 5\' 1"  (1.549 m)   Wt 132 lb (59.9 kg)   SpO2 98%   BMI 24.94 kg/m   BP Readings from Last 3 Encounters:  12/24/17 126/78  07/23/17 130/68  06/25/17 128/72    Wt Readings from Last 3 Encounters:  12/24/17 132 lb (59.9 kg)  08/13/17 126 lb (57.2 kg)  07/23/17 130 lb (59 kg)    Physical Exam  Constitutional: She appears well-developed. No distress.  HENT:  Head: Normocephalic.  Right Ear: External ear normal.  Left Ear: External ear normal.  Nose: Nose normal.  Mouth/Throat: Oropharynx is clear and moist.  Eyes: Pupils are equal, round, and reactive to light. Conjunctivae are normal. Right eye exhibits no discharge. Left eye exhibits no discharge.  Neck: Normal range of motion. Neck supple. No JVD present. No tracheal deviation present. No thyromegaly present.  Cardiovascular: Normal rate, regular rhythm and normal heart sounds.  Pulmonary/Chest: No stridor. No respiratory distress. She has no wheezes.  Abdominal: Soft. Bowel sounds are normal. She exhibits no distension and no mass. There is no tenderness. There is no rebound and no guarding.  Musculoskeletal: She exhibits no edema or tenderness.  Lymphadenopathy:    She has no cervical adenopathy.  Neurological: She displays normal reflexes. No cranial nerve deficit. She exhibits normal muscle tone. Coordination normal.  Skin: No rash noted. No erythema.  Psychiatric: She has a normal mood and affect. Her behavior is normal. Judgment and  thought content normal.    Lab Results  Component Value Date   WBC 5.6 05/02/2015   HGB 14.2 05/02/2015   HCT 42.2 05/02/2015   PLT 266.0 05/02/2015   GLUCOSE 101 (H) 12/20/2017   CHOL 216 (H) 12/20/2017   TRIG 100.0 12/20/2017   HDL 54.80 12/20/2017   LDLDIRECT 170.0 06/26/2017   LDLCALC 142 (H) 12/20/2017   ALT 22 06/28/2016   AST 18 06/28/2016   NA 137 12/20/2017   K 4.4 12/20/2017   CL 102 12/20/2017   CREATININE 0.58 12/20/2017   BUN  13 12/20/2017   CO2 28 12/20/2017   TSH 0.77 05/02/2015   HGBA1C 5.9 12/20/2017    Dexascan  Result Date: 07/24/2017 EXAM: DUAL X-RAY ABSORPTIOMETRY (DXA) FOR BONE MINERAL DENSITY IMPRESSION: Referring Physician:  Cassandria Anger PATIENT: Name: Gingras, Edan A Patient ID: [id number] Birth Date: 01-28-1946 Height: 60.7 in. Sex: Female Measured: 07/24/2017 Weight: 128.0 lbs. Indications: Advanced Age, Caucasian, Estrogen Deficient, Family Hist. (Parent hip fracture), Height Loss (781.91), History of Fracture (Adult) (V15.51), Low Calcium Intake (269.3), Postmenopausal, Vitamin D Deficient Fractures: Finger Treatments: Vitamin D (E933.5) ASSESSMENT: The BMD measured at Femur Neck Left is 0.764 g/cm2 with a T-score of -2.0. This patient is considered OSTEOPENIC according to [facility]) criteria. L-3 and L-4 were excluded due to degenerative changes. Site Region Measured Date Measured Age YA T-score BMD Significant CHANGE DualFemur Neck Left 07/24/2017    71.4         -2.0    0.764 g/cm2 AP Spine  L1-L2     07/24/2017    71.4         -0.6    1.098 g/cm2 World Health Organization [facility]) criteria for post-menopausal, Caucasian Women: Normal       T-score at or above -1 SD Osteopenia   T-score between -1 and -2.5 SD Osteoporosis T-score at or below -2.5 SD RECOMMENDATION: [place] recommends that FDA-approved medical therapies be considered in postmenopausal women and men age 35 or older with a: 1. Hip or vertebral (clinical or morphometric) fracture. 2. T-score of <-2.5 at the spine or hip. 3. Ten-year fracture probability by FRAX of 3% or greater for hip fracture or 20% or greater for major osteoporotic fracture. All treatment decisions require clinical judgment and consideration of individual patient factors, including patient preferences, co-morbidities, previous drug use, risk factors not captured in the FRAX model (e.g. falls, vitamin D deficiency, increased bone  turnover, interval significant decline in bone density) and possible under - or over-estimation of fracture risk by FRAX. All patients should ensure an adequate intake of dietary calcium (1200 mg/d) and vitamin D (800 IU daily) unless contraindicated. FOLLOW-UP: People with diagnosed cases of osteoporosis or at high risk for fracture should have regular bone mineral density tests. For patients eligible for Medicare, routine testing is allowed once every 2 years. The testing frequency can be increased to one year for patients who have rapidly progressing disease, those who are receiving or discontinuing medical therapy to restore bone mass, or have additional risk factors. I have reviewed this report, and agree with the above findings. Mark A. Thornton Papas, M.D. [facility] FRAX* 10-year Probability of Fracture Based on femoral neck BMD: DualFemur (Left) Major Osteoporotic Fracture: 29.1% Hip Fracture:                9.7% Population:                  [place] (Caucasian) Risk  Factors: Family Hist. (Parent hip fracture), History of Fracture (Adult) (V15.51) *FRAX is a Materials engineer of the [employer] of [employer] for Metabolic Bone Disease, a World Pharmacologist (WHO) [facility]. ASSESSMENT: The probability of a major osteoporotic fracture is 29.1 % within the next ten years. The probability of a hip fracture is 9.7 % within the next ten years. I have reviewed this report and agree with the above findings. Mark A. Thornton Papas, M.D. [facility] Electronically Signed   By: Lavonia Dana M.D.   On: 07/24/2017 10:34   Mm Screening Breast Tomo Bilateral  Result Date: 07/24/2017 CLINICAL DATA:  Screening. EXAM: 2D DIGITAL SCREENING BILATERAL MAMMOGRAM WITH CAD AND ADJUNCT TOMO COMPARISON:  Previous exam(s). ACR Breast Density Category b: There are scattered areas of fibroglandular density. FINDINGS: There are no findings suspicious for malignancy. Images were processed with CAD.  IMPRESSION: No mammographic evidence of malignancy. A result letter of this screening mammogram will be mailed directly to the patient. RECOMMENDATION: Screening mammogram in one year. (Code:SM-B-01Y) BI-RADS CATEGORY  1: Negative. Electronically Signed   By: Abelardo Diesel M.D.   On: 07/24/2017 14:38    Assessment & Plan:   There are no diagnoses linked to this encounter. I am having Adyn A. Floyd maintain her cholecalciferol, ascorbic acid, cyanocobalamin, Glucosamine HCl, Turmeric, VASCEPA, ibuprofen, lisinopril-hydrochlorothiazide, and Na Sulfate-K Sulfate-Mg Sulf.  No orders of the defined types were placed in this encounter.    Follow-up: No follow-ups on file.  Walker Kehr, MD

## 2017-12-24 NOTE — Assessment & Plan Note (Signed)
On Vit D 

## 2017-12-24 NOTE — Assessment & Plan Note (Signed)
Labs

## 2018-01-06 ENCOUNTER — Ambulatory Visit: Payer: Medicare Other | Admitting: Internal Medicine

## 2018-01-06 ENCOUNTER — Encounter: Payer: Self-pay | Admitting: Internal Medicine

## 2018-01-06 DIAGNOSIS — R1084 Generalized abdominal pain: Secondary | ICD-10-CM

## 2018-01-06 DIAGNOSIS — R109 Unspecified abdominal pain: Secondary | ICD-10-CM | POA: Insufficient documentation

## 2018-01-06 NOTE — Assessment & Plan Note (Signed)
?  etiology CT was advised - pt declined The pt wants to see Dr Kennon Rounds (GYN) first

## 2018-01-06 NOTE — Progress Notes (Signed)
Subjective:  Patient ID: Lisa [name], female    DOB: 07-Jan-1946  Age: 71 y.o. MRN: [id number]  CC: No chief complaint on file.   HPI Lisa [name] presents for fullness in the abd - lower 1/2 x off and on for weeks. Worse sx's in the past week. BMs are regular. C/o gas... Colon 2003 - diverticulosis  Outpatient Medications Prior to Visit  Medication Sig Dispense Refill  . ascorbic acid (VITAMIN C) 500 MG tablet Take 500 mg by mouth as needed.     . cholecalciferol (VITAMIN D) 1000 UNITS tablet Take 2,000 Units by mouth daily.     . cyanocobalamin 1000 MCG tablet Take 1,000 mcg by mouth daily.    . Glucosamine HCl 500 MG TABS Take by mouth 2 (two) times daily.     Marland Kitchen ibuprofen (ADVIL,MOTRIN) 100 MG tablet Take 100 mg by mouth every 6 (six) hours as needed for pain.    Marland Kitchen lisinopril-hydrochlorothiazide (PRINZIDE,ZESTORETIC) 10-12.5 MG tablet TAKE 1 TABLET BY MOUTH DAILY. 90 tablet 2  . Na Sulfate-K Sulfate-Mg Sulf 17.5-3.13-1.6 GM/177ML SOLN Suprep (no substitutions)-TAKE AS DIRECTED. 354 mL 0  . Turmeric 500 MG TABS Take 2 capsules by mouth 2 (two) times daily.    Marland Kitchen VASCEPA 1 g CAPS TAKE 2 CAPSULES BY MOUTH TWICE [name] DAY 120 capsule 11   No facility-administered medications prior to visit.     ROS Review of Systems  Constitutional: Positive for chills. Negative for activity change, appetite change, fatigue, fever and unexpected weight change.  HENT: Negative for congestion, mouth sores and sinus pressure.   Eyes: Negative for visual disturbance.  Respiratory: Negative for cough and chest tightness.   Gastrointestinal: Positive for abdominal distention and abdominal pain. Negative for blood in stool, nausea and rectal pain.  Genitourinary: Negative for difficulty urinating, frequency and vaginal pain.  Musculoskeletal: Negative for back pain and gait problem.  Skin: Negative for pallor and rash.  Neurological: Negative for dizziness, tremors, weakness, numbness and headaches.    Psychiatric/Behavioral: Negative for confusion and sleep disturbance.    Objective:  BP 122/76 (BP Location: Right Arm, Patient Position: Sitting, Cuff Size: Large)   Pulse 75   Temp 98.8 F (37.1 C) (Oral)   Ht 5\' 1"  (1.549 m)   Wt 133 lb (60.3 kg)   SpO2 97%   BMI 25.13 kg/m   BP Readings from Last 3 Encounters:  01/06/18 122/76  12/24/17 126/78  07/23/17 130/68    Wt Readings from Last 3 Encounters:  01/06/18 133 lb (60.3 kg)  12/24/17 132 lb (59.9 kg)  08/13/17 126 lb (57.2 kg)    Physical Exam  Constitutional: She appears well-developed. No distress.  HENT:  Head: Normocephalic.  Right Ear: External ear normal.  Left Ear: External ear normal.  Nose: Nose normal.  Mouth/Throat: Oropharynx is clear and moist.  Eyes: Pupils are equal, round, and reactive to light. Conjunctivae are normal. Right eye exhibits no discharge. Left eye exhibits no discharge.  Neck: Normal range of motion. Neck supple. No JVD present. No tracheal deviation present. No thyromegaly present.  Cardiovascular: Normal rate, regular rhythm and normal heart sounds.  Pulmonary/Chest: No stridor. No respiratory distress. She has no wheezes.  Abdominal: Soft. Bowel sounds are normal. She exhibits no distension and no mass. There is no tenderness. There is no rebound and no guarding.  Musculoskeletal: She exhibits no edema or tenderness.  Lymphadenopathy:    She has no cervical adenopathy.  Neurological: She displays normal reflexes.  No cranial nerve deficit. She exhibits normal muscle tone. Coordination normal.  Skin: No rash noted. No erythema.  Psychiatric: She has [name] normal mood and affect. Her behavior is normal. Judgment and thought content normal.    Lab Results  Component Value Date   WBC 5.6 05/02/2015   HGB 14.2 05/02/2015   HCT 42.2 05/02/2015   PLT 266.0 05/02/2015   GLUCOSE 101 (H) 12/20/2017   CHOL 216 (H) 12/20/2017   TRIG 100.0 12/20/2017   HDL 54.80 12/20/2017   LDLDIRECT  170.0 06/26/2017   LDLCALC 142 (H) 12/20/2017   ALT 22 06/28/2016   AST 18 06/28/2016   NA 137 12/20/2017   K 4.4 12/20/2017   CL 102 12/20/2017   CREATININE 0.58 12/20/2017   BUN 13 12/20/2017   CO2 28 12/20/2017   TSH 0.77 05/02/2015   HGBA1C 5.9 12/20/2017    Dexascan  Result Date: 07/24/2017 EXAM: DUAL X-RAY ABSORPTIOMETRY (DXA) FOR BONE MINERAL DENSITY IMPRESSION: Referring Physician:  Cassandria Anger PATIENT: Name: Lisa [name] Patient ID: [id number] Birth Date: Feb 27, 1946 Height: 60.7 in. Sex: Female Measured: 07/24/2017 Weight: 128.0 lbs. Indications: Advanced Age, Caucasian, Estrogen Deficient, Family Hist. (Parent hip fracture), Height Loss (781.91), History of Fracture (Adult) (V15.51), Low Calcium Intake (269.3), Postmenopausal, Vitamin D Deficient Fractures: Finger Treatments: Vitamin D (E933.5) ASSESSMENT: The BMD measured at Femur Neck Left is 0.764 g/cm2 with [name] T-score of -2.0. This patient is considered OSTEOPENIC according to [facility]) criteria. L-3 and L-4 were excluded due to degenerative changes. Site Region Measured Date Measured Age YA T-score BMD Significant CHANGE DualFemur Neck Left 07/24/2017    71.4         -2.0    0.764 g/cm2 AP Spine  L1-L2     07/24/2017    71.4         -0.6    1.098 g/cm2 World Health Organization [facility]) criteria for post-menopausal, Caucasian Women: Normal       T-score at or above -1 SD Osteopenia   T-score between -1 and -2.5 SD Osteoporosis T-score at or below -2.5 SD RECOMMENDATION: [place] recommends that FDA-approved medical therapies be considered in postmenopausal women and men age 37 or older with [name]: 1. Hip or vertebral (clinical or morphometric) fracture. 2. T-score of <-2.5 at the spine or hip. 3. Ten-year fracture probability by FRAX of 3% or greater for hip fracture or 20% or greater for major osteoporotic fracture. All treatment decisions require clinical judgment and consideration of  individual patient factors, including patient preferences, co-morbidities, previous drug use, risk factors not captured in the FRAX model (e.g. falls, vitamin D deficiency, increased bone turnover, interval significant decline in bone density) and possible under - or over-estimation of fracture risk by FRAX. All patients should ensure an adequate intake of dietary calcium (1200 mg/d) and vitamin D (800 IU daily) unless contraindicated. FOLLOW-UP: [name] with diagnosed cases of osteoporosis or at high risk for fracture should have regular bone mineral density tests. For patients eligible for Medicare, routine testing is allowed once every 2 years. The testing frequency can be increased to one year for patients who have rapidly progressing disease, those who are receiving or discontinuing medical therapy to restore bone mass, or have additional risk factors. I have reviewed this report, and agree with the above findings. Mark [name]. Thornton Papas, M.D. [facility] FRAX* 10-year Probability of Fracture Based on femoral neck BMD: DualFemur (Left) Major Osteoporotic Fracture: 29.1% Hip Fracture:  9.7% Population:                  [place] (Caucasian) Risk Factors: Family Hist. (Parent hip fracture), History of Fracture (Adult) (V15.51) *FRAX is [name] [place] of [employer] for Metabolic Bone Disease, [name] [place] (WHO) [facility]. ASSESSMENT: The probability of [name] major osteoporotic fracture is 29.1 % within the next ten years. The probability of [name] hip fracture is 9.7 % within the next ten years. I have reviewed this report and agree with the above findings. Mark [name]. Thornton Papas, M.D. [facility] Electronically Signed   By: Lavonia Dana M.D.   On: 07/24/2017 10:34   Mm Screening Breast Tomo Bilateral  Result Date: 07/24/2017 CLINICAL DATA:  Screening. EXAM: 2D DIGITAL SCREENING BILATERAL MAMMOGRAM WITH CAD AND ADJUNCT TOMO COMPARISON:  Previous  exam(s). ACR Breast Density Category b: There are scattered areas of fibroglandular density. FINDINGS: There are no findings suspicious for malignancy. Images were processed with CAD. IMPRESSION: No mammographic evidence of malignancy. [name] result letter of this screening mammogram will be mailed directly to the patient. RECOMMENDATION: Screening mammogram in one year. (Code:SM-B-01Y) BI-RADS CATEGORY  1: Negative. Electronically Signed   By: Abelardo Diesel M.D.   On: 07/24/2017 14:38    Assessment & Plan:   There are no diagnoses linked to this encounter. I am having Lisa [name] maintain her cholecalciferol, ascorbic acid, cyanocobalamin, Glucosamine HCl, Turmeric, VASCEPA, ibuprofen, lisinopril-hydrochlorothiazide, and Na Sulfate-K Sulfate-Mg Sulf.  No orders of the defined types were placed in this encounter.    Follow-up: No follow-ups on file.  Walker Kehr, MD

## 2018-01-06 NOTE — Patient Instructions (Signed)
Abdomen and pelvis CT

## 2018-01-21 ENCOUNTER — Encounter: Payer: Medicare Other | Admitting: Family Medicine

## 2018-01-26 ENCOUNTER — Other Ambulatory Visit: Payer: Self-pay | Admitting: Internal Medicine

## 2018-01-27 ENCOUNTER — Ambulatory Visit: Payer: Medicare Other | Admitting: Family Medicine

## 2018-01-27 ENCOUNTER — Encounter: Payer: Self-pay | Admitting: Family Medicine

## 2018-01-27 VITALS — BP 146/76 | HR 72 | Wt 133.0 lb

## 2018-01-27 DIAGNOSIS — N949 Unspecified condition associated with female genital organs and menstrual cycle: Secondary | ICD-10-CM | POA: Diagnosis not present

## 2018-01-27 DIAGNOSIS — R1030 Lower abdominal pain, unspecified: Secondary | ICD-10-CM

## 2018-01-27 NOTE — Patient Instructions (Signed)
Pelvic Pain, Female °Pelvic pain is pain in your lower belly (abdomen), below your belly button and between your hips. The pain Khalid start suddenly (acute), keep coming back (recurring), or last a long time (chronic). Pelvic pain that lasts longer than six months is considered chronic. There are many causes of pelvic pain. Sometimes the cause of your pelvic pain is not known. °Follow these instructions at home: °· Take over-the-counter and prescription medicines only as told by your doctor. °· Rest as told by your doctor. °· Do not have sex it if hurts. °· Keep a journal of your pelvic pain. Write down: °? When the pain started. °? Where the pain is located. °? What seems to make the pain better or worse, such as food or your menstrual cycle. °? Any symptoms you have along with the pain. °· Keep all follow-up visits as told by your doctor. This is important. °Contact a doctor if: °· Medicine does not help your pain. °· Your pain comes back. °· You have new symptoms. °· You have unusual vaginal discharge or bleeding. °· You have a fever or chills. °· You are having a hard time pooping (constipation). °· You have blood in your pee (urine) or poop (stool). °· Your pee smells bad. °· You feel weak or lightheaded. °Get help right away if: °· You have sudden pain that is very bad. °· Your pain continues to get worse. °· You have very bad pain and also have any of the following symptoms: °? A fever. °? Feeling stick to your stomach (nausea). °? Throwing up (vomiting). °? Being very sweaty. °· You pass out (lose consciousness). °This information is not intended to replace advice given to you by your health care provider. Make sure you discuss any questions you have with your health care provider. °Document Released: 02/20/2008 Document Revised: 09/28/2015 Document Reviewed: 06/24/2015 °Elsevier Interactive Patient Education © 2018 Elsevier Inc. ° °

## 2018-01-27 NOTE — Progress Notes (Signed)
LAST PAP 10-12 YEARS AGO DISCUSS CONSTIPATION  ABDOMINAL PAIN AND PELVIC FOR COUPLE WEEKS

## 2018-01-27 NOTE — Progress Notes (Signed)
   Subjective:    Patient ID: Lisa [name] is a 72 y.o. female presenting with Gynecologic Exam  on 01/27/2018  HPI: After ASH Wednesday began having abdominal pain or gas. Standing a lot. BMs are same. No diarrhea or constipation. Some pain with defecation. Worried she had diverticulitis and let it ride. She also has some mild back pain. Has had low grade temp in the 98-99. Now does not feel like she had emptied her rectum well after defection. Some transient nausea, no emesis no early satiety. Symptoms have improved. Felt fullness in her pelvis. Saw PCP who wanted CT scan but she decided to come here instead. Diagnosed with endometriosis, G0, some sporadic cycles. Has h/o endometrial polyp and had that removed. LMP 14 years ago. Notes no bleeding. No pap x 12 years. No longer sexually active Has h/o abnl pap.  Review of Systems  Constitutional: Negative for chills and fever.  Respiratory: Negative for shortness of breath.   Cardiovascular: Negative for chest pain.  Gastrointestinal: Negative for abdominal pain, nausea and vomiting.  Genitourinary: Negative for dysuria.  Skin: Negative for rash.      Objective:    BP (!) 146/76   Pulse 72   Wt 133 lb (60.3 kg)   BMI 25.13 kg/m  Physical Exam  Constitutional: She is oriented to person, place, and time. She appears well-developed and well-nourished. No distress.  HENT:  Head: Normocephalic and atraumatic.  Eyes: No scleral icterus.  Neck: Neck supple.  Cardiovascular: Normal rate.  Pulmonary/Chest: Effort normal.  Abdominal: Soft.  Genitourinary: Uterus normal. Uterus is not enlarged and not tender. Cervix exhibits no discharge. Right adnexum displays fullness. Right adnexum displays no tenderness.  Genitourinary Comments: Soft, mobile fullness noted to umbilicus on right  Neurological: She is alert and oriented to person, place, and time.  Skin: Skin is warm and dry.  Psychiatric: She has a normal mood and affect.          Assessment & Plan:   Problem List Items Addressed This Visit      Unprioritized   Abdominal pain - Primary    Rule out GYN pathology with pelvic u/s--if negative return to PCP. If positive, will treat accordingly.      Relevant Orders   US PELVIS (TRANSABDOMINAL ONLY)   US PELVIS TRANSVANGINAL NON-OB (TV ONLY)    Other Visit Diagnoses    Adnexal fullness          Total face-to-face time with patient: 20 minutes. Over 50% of encounter was spent on counseling and coordination of care. Return in 1 month (on 02/24/2018).  Donnamae Jude 01/27/2018 1:58 PM

## 2018-01-28 ENCOUNTER — Encounter: Payer: Self-pay | Admitting: Family Medicine

## 2018-01-28 NOTE — Assessment & Plan Note (Signed)
Rule out GYN pathology with pelvic u/s--if negative return to PCP. If positive, will treat accordingly.

## 2018-02-03 ENCOUNTER — Encounter: Payer: Self-pay | Admitting: Family Medicine

## 2018-02-03 ENCOUNTER — Ambulatory Visit (HOSPITAL_COMMUNITY)
Admission: RE | Admit: 2018-02-03 | Discharge: 2018-02-03 | Disposition: A | Discharge status: Home / Self Care | Payer: Medicare Other | Source: Ambulatory Visit | Attending: Family Medicine | Admitting: Family Medicine

## 2018-02-03 ENCOUNTER — Encounter (HOSPITAL_COMMUNITY): Payer: Self-pay

## 2018-02-03 DIAGNOSIS — R1030 Lower abdominal pain, unspecified: Secondary | ICD-10-CM | POA: Diagnosis not present

## 2018-02-04 ENCOUNTER — Encounter: Payer: Self-pay | Admitting: Internal Medicine

## 2018-02-06 ENCOUNTER — Other Ambulatory Visit: Payer: Self-pay | Admitting: Internal Medicine

## 2018-02-06 DIAGNOSIS — R1084 Generalized abdominal pain: Secondary | ICD-10-CM

## 2018-02-06 DIAGNOSIS — R140 Abdominal distension (gaseous): Secondary | ICD-10-CM

## 2018-02-11 ENCOUNTER — Other Ambulatory Visit: Payer: Self-pay

## 2018-02-11 DIAGNOSIS — R1084 Generalized abdominal pain: Secondary | ICD-10-CM

## 2018-02-14 ENCOUNTER — Other Ambulatory Visit (INDEPENDENT_AMBULATORY_CARE_PROVIDER_SITE_OTHER): Payer: Medicare Other

## 2018-02-14 DIAGNOSIS — R1084 Generalized abdominal pain: Secondary | ICD-10-CM

## 2018-02-14 LAB — BASIC METABOLIC PANEL
BUN: 11 mg/dL (ref 6–23)
CALCIUM: 10 mg/dL (ref 8.4–10.5)
CO2: 28 meq/L (ref 19–32)
CREATININE: 0.67 mg/dL (ref 0.40–1.20)
Chloride: 103 mEq/L (ref 96–112)
GFR: 91.95 mL/min (ref >60.00)
GLUCOSE: 88 mg/dL (ref 70–99)
Potassium: 4.4 mEq/L (ref 3.5–5.1)
Sodium: 139 mEq/L (ref 135–145)

## 2018-02-21 ENCOUNTER — Ambulatory Visit (INDEPENDENT_AMBULATORY_CARE_PROVIDER_SITE_OTHER)
Admission: RE | Admit: 2018-02-21 | Discharge: 2018-02-21 | Disposition: A | Discharge status: Home / Self Care | Payer: Medicare Other | Source: Ambulatory Visit | Attending: Internal Medicine | Admitting: Internal Medicine

## 2018-02-21 DIAGNOSIS — R140 Abdominal distension (gaseous): Secondary | ICD-10-CM

## 2018-02-21 DIAGNOSIS — R1084 Generalized abdominal pain: Secondary | ICD-10-CM

## 2018-02-21 MED ORDER — IOPAMIDOL (ISOVUE-300) INJECTION 61%
100.0000 mL | Freq: Once | INTRAVENOUS | Status: AC | PRN
  Administered 2018-02-21: 100 mL via INTRAVENOUS

## 2018-02-24 ENCOUNTER — Ambulatory Visit: Payer: Medicare Other | Admitting: Family Medicine

## 2018-02-24 ENCOUNTER — Encounter: Payer: Self-pay | Admitting: Family Medicine

## 2018-02-24 DIAGNOSIS — R1031 Right lower quadrant pain: Secondary | ICD-10-CM | POA: Diagnosis not present

## 2018-02-24 NOTE — Progress Notes (Signed)
   Subjective:    Patient ID: Lisa [name] is a 72 y.o. female presenting with Follow-up (pelvic discomfort )  on 02/24/2018  HPI: Here today to f/u some RLQ pain.  Has had essentially normal u/s and CT. Suspect some bowel changes due to lack of exercise and change in eating habits. Some loose stools. Pain is resolved. Has some diverticula noted on CT. Very small (subcentimeter) leiomyoma noted--not of any significant relevance.  Review of Systems  Constitutional: Negative for chills and fever.  Respiratory: Negative for shortness of breath.   Cardiovascular: Negative for chest pain.  Gastrointestinal: Negative for abdominal pain, nausea and vomiting.  Genitourinary: Negative for dysuria.  Skin: Negative for rash.      Objective:    BP 110/68   Pulse 72   Wt 133 lb 6.4 oz (60.5 kg)   BMI 25.21 kg/m  Physical Exam  Constitutional: She is oriented to person, place, and time. She appears well-developed and well-nourished.  HENT:  Head: Normocephalic and atraumatic.  Eyes: Pupils are equal, round, and reactive to light. No scleral icterus.  Neck: Normal range of motion. No thyromegaly present.  Cardiovascular: Normal rate, regular rhythm and intact distal pulses.  Pulmonary/Chest: Effort normal and breath sounds normal.  Abdominal: Soft. She exhibits no distension. There is no tenderness.  Neurological: She is alert and oriented to person, place, and time.  Skin: Skin is warm and dry.        Assessment & Plan:   Problem List Items Addressed This Visit      Unprioritized   Abdominal pain    No GYN reason for pain, which has since resolved.          Total face-to-face time with patient: 15 minutes. Over 50% of encounter was spent on counseling and coordination of care. Return if symptoms worsen or fail to improve.  Donnamae Jude 02/24/2018 1:50 PM

## 2018-02-24 NOTE — Assessment & Plan Note (Signed)
No GYN reason for pain, which has since resolved.

## 2018-02-24 NOTE — Patient Instructions (Signed)
Diverticulitis °Diverticulitis is when small pockets in your large intestine (colon) get infected or swollen. This causes stomach pain and watery poop (diarrhea). °These pouches are called diverticula. They form in people who have a condition called diverticulosis. °Follow these instructions at home: °Medicines °· Take over-the-counter and prescription medicines only as told by your doctor. These include: °? Antibiotics. °? Pain medicines. °? Fiber pills. °? Probiotics. °? Stool softeners. °· Do not drive or use heavy machinery while taking prescription pain medicine. °· If you were prescribed an antibiotic, take it as told. Do not stop taking it even if you feel better. °General instructions °· Follow a diet as told by your doctor. °· When you feel better, your doctor Loeffler tell you to change your diet. You Angelino need to eat a lot of fiber. Fiber makes it easier to poop (have bowel movements). Healthy foods with fiber include: °? Berries. °? Beans. °? Lentils. °? Green vegetables. °· Exercise 3 or more times a week. Aim for 30 minutes each time. Exercise enough to sweat and make your heart beat faster. °· Keep all follow-up visits as told. This is important. You Romain need to have an exam of the large intestine. This is called a colonoscopy. °Contact a doctor if: °· Your pain does not get better. °· You have a hard time eating or drinking. °· You are not pooping like normal. °Get help right away if: °· Your pain gets worse. °· Your problems do not get better. °· Your problems get worse very fast. °· You have a fever. °· You throw up (vomit) more than one time. °· You have poop that is: °? Bloody. °? Black. °? Tarry. °Summary °· Diverticulitis is when small pockets in your large intestine (colon) get infected or swollen. °· Take medicines only as told by your doctor. °· Follow a diet as told by your doctor. °This information is not intended to replace advice given to you by your health care provider. Make sure you discuss  any questions you have with your health care provider. °Document Released: 02/20/2008 Document Revised: 09/20/2016 Document Reviewed: 09/20/2016 °Elsevier Interactive Patient Education © 2017 [employer] Inc[employer] ° °

## 2018-04-24 ENCOUNTER — Other Ambulatory Visit: Payer: Self-pay | Admitting: Internal Medicine

## 2018-05-01 ENCOUNTER — Other Ambulatory Visit (INDEPENDENT_AMBULATORY_CARE_PROVIDER_SITE_OTHER): Payer: Medicare Other

## 2018-05-01 ENCOUNTER — Ambulatory Visit: Payer: Self-pay | Admitting: *Deleted

## 2018-05-01 ENCOUNTER — Encounter: Payer: Self-pay | Admitting: Family

## 2018-05-01 ENCOUNTER — Ambulatory Visit: Payer: Medicare Other | Admitting: Family

## 2018-05-01 VITALS — BP 124/78 | HR 84 | Temp 97.4°F | Ht 61.0 in | Wt 132.0 lb

## 2018-05-01 DIAGNOSIS — R197 Diarrhea, unspecified: Secondary | ICD-10-CM

## 2018-05-01 LAB — CBC WITH DIFFERENTIAL/PLATELET
BASOS ABS: 0.1 10*3/uL (ref 0.0–0.1)
Basophils Relative: 0.6 % (ref 0.0–3.0)
Eosinophils Absolute: 0.1 10*3/uL (ref 0.0–0.7)
Eosinophils Relative: 0.6 % (ref 0.0–5.0)
HEMATOCRIT: 43.4 % (ref 36.0–46.0)
HEMOGLOBIN: 14.4 g/dL (ref 12.0–15.0)
LYMPHS PCT: 14.1 % (ref 12.0–46.0)
Lymphs Abs: 2.3 10*3/uL (ref 0.7–4.0)
MCHC: 33.1 g/dL (ref 30.0–36.0)
MCV: 87.3 fl (ref 78.0–100.0)
MONOS PCT: 8.3 % (ref 3.0–12.0)
Monocytes Absolute: 1.3 10*3/uL — ABNORMAL HIGH (ref 0.1–1.0)
Neutro Abs: 12.4 10*3/uL — ABNORMAL HIGH (ref 1.4–7.7)
Neutrophils Relative %: 76.4 % (ref 43.0–77.0)
Platelets: 315 10*3/uL (ref 150.0–400.0)
RBC: 4.97 Mil/uL (ref 3.87–5.11)
RDW: 13.2 % (ref 11.5–15.5)
WBC: 16.2 10*3/uL — ABNORMAL HIGH (ref 4.0–10.5)

## 2018-05-01 LAB — COMPREHENSIVE METABOLIC PANEL
ALK PHOS: 59 U/L (ref 39–117)
ALT: 22 U/L (ref 0–35)
AST: 18 U/L (ref 0–37)
Albumin: 4.3 g/dL (ref 3.5–5.2)
BILIRUBIN TOTAL: 0.4 mg/dL (ref 0.2–1.2)
BUN: 19 mg/dL (ref 6–23)
CALCIUM: 10.4 mg/dL (ref 8.4–10.5)
CO2: 28 meq/L (ref 19–32)
Chloride: 98 mEq/L (ref 96–112)
Creatinine, Ser: 0.71 mg/dL (ref 0.40–1.20)
GFR: 85.94 mL/min (ref >60.00)
Glucose, Bld: 95 mg/dL (ref 70–99)
Potassium: 3.6 mEq/L (ref 3.5–5.1)
Sodium: 134 mEq/L — ABNORMAL LOW (ref 135–145)
TOTAL PROTEIN: 7.4 g/dL (ref 6.0–8.3)

## 2018-05-01 LAB — LIPASE: LIPASE: 12 U/L (ref 11.0–59.0)

## 2018-05-01 LAB — AMYLASE: Amylase: 51 U/L (ref 27–131)

## 2018-05-01 NOTE — Telephone Encounter (Signed)
Pt states that around 12:30pm-12:45pm today she broke out into an "awful sweat", experienced abdominal pain that radiated to her back and felt that her pulse was more thready than normal and experienced vision and hearing changes. 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 Pt states that she called 911 and was checked out and BP was 110/74, P-74 and pt states she was still sweaty. Pt states after EMS left she had a large watery stool and progressively began to feel better. Pt states she started to have epigastric pain and also took a Nurse, adult and feels better at this time and currently denies any abdominal pain. Pt denies being around anyone sick or eating any spoiled food. Pt states that only thing she had today was a brown and serve roll and peanut butter that was still in date. Pt requesting to come in the office today.PCP has no appt availability for today.Pt scheduled for appt today with Rosann Auerbach. Pt advised that if symptoms return or if she becomes worse before appt to seek treatment in the ED or to call 911. Pt verbalized understanding.  Reason for Disposition . [1] SEVERE pain AND [2] present < 1 hour  Answer Assessment - Initial Assessment Questions 1. LOCATION: "Where does it hurt?"      Denies any pain at this time but did experience pain around 12:30-12:45 2. RADIATION: "Does the pain shoot anywhere else?" (e.g., chest, back)     Pt states the pain radiated to her back 3. ONSET: "When did the pain begin?" (e.g., minutes, hours or days ago)      Around 12;30-12:45 4. SUDDEN: "Gradual or sudden onset?"     Sudden constant 5. PATTERN "Does the pain come and go, or is it constant?"    - If constant: "Is it getting better, staying the same, or worsening?"      (Note: Constant means the pain never goes away completely; most serious pain is constant and it progresses)     - If intermittent: "How long does it last?" "Do you  have pain now?"     (Note: Intermittent means the pain goes away completely between bouts)     Denies pain currentliy 6. SEVERITY: "How bad is the pain?"  (e.g., Scale 1-10; mild, moderate, or severe)   - MILD (1-3): doesn't interfere with normal activities, abdomen soft and not tender to touch    - MODERATE (4-7): interferes with normal activities or awakens from sleep, tender to touch    - SEVERE (8-10): excruciating pain, doubled over, unable to do any normal activities      No pain 7. RECURRENT SYMPTOM: "Have you ever had this type of abdominal pain before?" If so, ask: "When was the last time?" and "What happened that time?"      no 8. CAUSE: "What do you think is causing the abdominal pain?"     unsure 9. RELIEVING/AGGRAVATING FACTORS: "What makes it better or worse?" (e.g., movement, antacids, bowel movement)     Having a bowel movement and taking TUMS made the pain better 10. OTHER SYMPTOMS: "Has there been any vomiting, diarrhea, constipation, or urine problems?"       Large watery stool  Protocols used: ABDOMINAL PAIN - [facility]

## 2018-05-01 NOTE — Progress Notes (Signed)
Lisa [name] is a 72 y.o. female with the following history as recorded in [employer]:  Patient Active Problem List   Diagnosis Date Noted  . Abdominal pain 01/06/2018  . Knee pain, right 12/21/2016  . Hip flexor tightness 11/30/2015  . Piriformis syndrome of left side 04/20/2014  . IT band syndrome 04/20/2014  . Frequent falls 04/20/2014  . Diabetes mellitus type 2 without retinopathy ([place]) 02/19/2013  . Well adult exam 07/13/2012  . Left lumbar radiculopathy 04/08/2012  . Macular hole 02/28/2012  . Status post intraocular lens implant 02/28/2012  . Vitamin D deficiency 04/30/2008  . Dyslipidemia 10/31/2007  . RECENT RETINAL DETACHMENT PARTIAL W/GIANT TEAR 10/31/2007  . Essential hypertension 10/31/2007    Current Outpatient Medications  Medication Sig Dispense Refill  . ascorbic acid (VITAMIN C) 500 MG tablet Take 500 mg by mouth as needed.     . cholecalciferol (VITAMIN D) 1000 UNITS tablet Take 2,000 Units by mouth daily.     . cyanocobalamin 1000 MCG tablet Take 1,000 mcg by mouth daily.    . Glucosamine HCl 500 MG TABS Take by mouth 2 (two) times daily.     Marland Kitchen ibuprofen (ADVIL,MOTRIN) 100 MG tablet Take 100 mg by mouth every 6 (six) hours as needed for pain.    Marland Kitchen lisinopril-hydrochlorothiazide (PRINZIDE,ZESTORETIC) 10-12.5 MG tablet TAKE 1 TABLET BY MOUTH DAILY. 90 tablet 3  . Turmeric 500 MG TABS Take 2 capsules by mouth 2 (two) times daily.    Marland Kitchen VASCEPA 1 g CAPS TAKE 2 CAPSULES BY MOUTH TWICE A DAY 120 capsule 11   No current facility-administered medications for this visit.     Allergies: Atorvastatin; Ezetimibe-simvastatin; Fish oil; Lidocaine; Livalo [pitavastatin calcium]; Lovastatin; Rosuvastatin; and Zetia [ezetimibe]  Past Medical History:  Diagnosis Date  . Cataract 2009   right-Dr. Gershon Crane  . Hyperlipidemia   . Hypertension   . Post-operative nausea and vomiting   . Retinal tear 2008   B- Dr. Charolette Forward  . Vitamin D deficiency     Past Surgical History:   Procedure Laterality Date  . APPENDECTOMY  1963  . CATARACT EXTRACTION     Dr. Gershon Crane  . POLYPECTOMY    . RETINAL TEAR REPAIR CRYOTHERAPY     Dr. Charolette Forward    Family History  Problem Relation Age of Onset  . COPD Father   . Heart disease Father   . COPD Sister   . Hyperlipidemia Other   . Alzheimer's disease Mother   . Colon cancer Maternal Aunt        60's  . Stomach cancer Maternal Aunt     Social History   Tobacco Use  . Smoking status: Never Smoker  . Smokeless tobacco: Never Used  Substance Use Topics  . Alcohol use: Yes    Comment: wine occ. every week per pt    Subjective:  Patient had sudden onset of pain of abdomen pain today after eating lunch- notes that she broke out into a "cold sweat" and intense abdominal pain; became concerned that she might have been having heart attack/ aneurysm and opted to call EMS; prior to EMS arrival, she had an episode of explosive diarrhea and "felt better." When EMS arrived, they checked her blood pressure and EKG which were both normal; patient opted against going to the ER and felt better with taking a TUMS; is here today and notes that she is feeling much better at time of appointment- "feel exhausted and feel like someone has beaten me up"  but is concerned about her gallbladder; no known food allergies;    Objective:  Vitals:   05/01/18 1504  BP: 124/78  Pulse: 84  Temp: (!) 97.4 F (36.3 C)  TempSrc: Oral  SpO2: 99%  Weight: 132 lb 0.6 oz (59.9 kg)  Height: 5' 1"  (1.549 m)    General: Well developed, well nourished, in no acute distress  Skin : Warm and dry.  Head: Normocephalic and atraumatic  Lungs: Respirations unlabored; clear to auscultation bilaterally without wheeze, rales, rhonchi  CVS exam: normal rate and regular rhythm.  Abdomen: Soft; nontender; nondistended; normoactive bowel sounds; no masses or hepatosplenomegaly  Neurologic: Alert and oriented; speech intact; face symmetrical; moves all extremities well;  CNII-XII intact without focal deficit   Assessment:  1. Diarrhea, unspecified type     Plan:  Acute episode earlier today; reviewed EKG done by EMS earlier today- no acute changes; ? Atypical presentation for gallbladder attack; update labs today; follow-up to be determined; Strict ER precautions discussed.   No follow-ups on file.  Orders Placed This Encounter  Procedures  . CBC w/Diff    Standing Status:   Future    Number of Occurrences:   1    Standing Expiration Date:   05/01/2019  . Comp Met (CMET)    Standing Status:   Future    Number of Occurrences:   1    Standing Expiration Date:   05/01/2019  . Amylase    Standing Status:   Future    Number of Occurrences:   1    Standing Expiration Date:   05/01/2019  . Lipase    Standing Status:   Future    Number of Occurrences:   1    Standing Expiration Date:   05/01/2019    Requested Prescriptions    No prescriptions requested or ordered in this encounter

## 2018-05-02 ENCOUNTER — Other Ambulatory Visit: Payer: Self-pay | Admitting: Family

## 2018-05-02 DIAGNOSIS — D729 Disorder of white blood cells, unspecified: Secondary | ICD-10-CM

## 2018-05-02 DIAGNOSIS — R109 Unspecified abdominal pain: Secondary | ICD-10-CM

## 2018-05-05 ENCOUNTER — Encounter: Payer: Self-pay | Admitting: Internal Medicine

## 2018-05-09 ENCOUNTER — Other Ambulatory Visit (INDEPENDENT_AMBULATORY_CARE_PROVIDER_SITE_OTHER): Payer: Medicare Other

## 2018-05-09 DIAGNOSIS — D729 Disorder of white blood cells, unspecified: Secondary | ICD-10-CM

## 2018-05-09 LAB — CBC WITH DIFFERENTIAL/PLATELET
BASOS PCT: 1.4 % (ref 0.0–3.0)
Basophils Absolute: 0.1 10*3/uL (ref 0.0–0.1)
EOS PCT: 1.6 % (ref 0.0–5.0)
Eosinophils Absolute: 0.1 10*3/uL (ref 0.0–0.7)
HEMATOCRIT: 41.8 % (ref 36.0–46.0)
HEMOGLOBIN: 14 g/dL (ref 12.0–15.0)
LYMPHS PCT: 39.6 % (ref 12.0–46.0)
Lymphs Abs: 2.9 10*3/uL (ref 0.7–4.0)
MCHC: 33.5 g/dL (ref 30.0–36.0)
MCV: 86.3 fl (ref 78.0–100.0)
Monocytes Absolute: 0.6 10*3/uL (ref 0.1–1.0)
Monocytes Relative: 8.1 % (ref 3.0–12.0)
Neutro Abs: 3.6 10*3/uL (ref 1.4–7.7)
Neutrophils Relative %: 49.3 % (ref 43.0–77.0)
Platelets: 291 10*3/uL (ref 150.0–400.0)
RBC: 4.84 Mil/uL (ref 3.87–5.11)
RDW: 13 % (ref 11.5–15.5)
WBC: 7.4 10*3/uL (ref 4.0–10.5)

## 2018-05-16 LAB — FECAL OCCULT BLOOD, GUAIAC: Fecal Occult Blood: NEGATIVE

## 2018-05-29 ENCOUNTER — Encounter: Payer: Self-pay | Admitting: Internal Medicine

## 2018-06-19 ENCOUNTER — Other Ambulatory Visit: Payer: Self-pay | Admitting: Internal Medicine

## 2018-06-19 DIAGNOSIS — Z1231 Encounter for screening mammogram for malignant neoplasm of breast: Secondary | ICD-10-CM

## 2018-07-03 ENCOUNTER — Encounter: Payer: Self-pay | Admitting: Internal Medicine

## 2018-07-03 ENCOUNTER — Ambulatory Visit: Payer: Medicare Other | Admitting: Internal Medicine

## 2018-07-03 ENCOUNTER — Encounter: Payer: Medicare Other | Admitting: Internal Medicine

## 2018-07-03 VITALS — BP 130/70 | HR 72 | Ht 61.0 in | Wt 133.0 lb

## 2018-07-03 DIAGNOSIS — R1032 Left lower quadrant pain: Secondary | ICD-10-CM | POA: Diagnosis not present

## 2018-07-03 DIAGNOSIS — K5790 Diverticulosis of intestine, part unspecified, without perforation or abscess without bleeding: Secondary | ICD-10-CM

## 2018-07-03 DIAGNOSIS — Z1211 Encounter for screening for malignant neoplasm of colon: Secondary | ICD-10-CM | POA: Diagnosis not present

## 2018-07-03 NOTE — Patient Instructions (Signed)

## 2018-07-03 NOTE — Progress Notes (Signed)
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  She is sent today by her primary care provider Dr. Alain Marion regarding previous problems with lower abdominal pain, question diverticulitis, and the need for screening colonoscopy.  The patient has not been seen in this office since 2003 when she underwent routine screening colonoscopy.  The examination revealed sigmoid diverticulosis.  A sigmoid colon polyp was removed and found to be hyperplastic.  She has not had follow-up colonoscopy since.  Patient tells me that earlier this year she was having significant left lower quadrant discomfort without fevers.  She did undergo a GYN evaluation which was negative.  Within a few weeks the discomfort resolved.  She did undergo a CT scan of the abdomen and pelvis performed February 20, 2018.  Examination revealed fatty liver, scattered hepatic cysts, and sigmoid diverticulosis without diverticulitis.  Review of outside blood work from August 2019 finds normal CBC with hemoglobin 14.0.  Unremarkable comprehensive metabolic panel.  She did undergo stool FIT testing May 06, 2018.  This was negative.  No family history of colon cancer.  The patient reports regular bowel movements.  No bleeding.  She does have occasional indigestion and heartburn but no dysphasia  REVIEW OF SYSTEMS:  All non-GI ROS negative unless otherwise stated in the HPI except for arthritis  Past Medical History:  Diagnosis Date  . Arthritis   . Cataract 2009   right-Dr. Gershon Crane  . Hyperlipidemia   . Hypertension   . Post-operative nausea and vomiting   . Retinal tear 2008   B- Dr. Charolette Forward  . Vitamin D deficiency     Past Surgical History:  Procedure Laterality Date  . APPENDECTOMY  1963  . CATARACT EXTRACTION     Dr. Gershon Crane  . POLYPECTOMY    . RETINAL TEAR REPAIR CRYOTHERAPY     Dr. [place] A  Olthoff  reports that she has never smoked. She has never used smokeless tobacco. She reports that she drinks alcohol. She reports that she does not use drugs.  family history includes Alzheimer's disease in her mother; COPD in her father and sister; Colon cancer in her maternal aunt; Heart disease in her father; Hyperlipidemia in her other; Stomach cancer in her maternal aunt.  Allergies  Allergen Reactions  . Atorvastatin     REACTION: achy  . Ezetimibe-Simvastatin     REACTION: myalgia  . Fish Oil     REACTION: bruising  . Lidocaine     Other reaction(s): Other (See Comments) Blisters from use in a dental office  . Livalo [Pitavastatin Calcium]     pain  . Lovastatin     REACTION: myalgias  . Rosuvastatin     REACTION: myalgia  . Zetia [Ezetimibe]     Side effects       PHYSICAL EXAMINATION: Vital signs: BP 130/70   Pulse 72   Ht 5\' 1"  (1.549 m)   Wt 133 lb (60.3 kg)   BMI 25.13 kg/m   Constitutional: generally well-appearing, no acute distress Psychiatric: alert and oriented x3, cooperative Eyes: extraocular movements intact, anicteric, conjunctiva pink Mouth: oral pharynx moist, no lesions Neck: supple no lymphadenopathy Cardiovascular: heart regular rate and rhythm, no murmur Lungs: clear to auscultation bilaterally Abdomen: soft, nontender, nondistended, no obvious ascites, no peritoneal signs, normal bowel sounds, no organomegaly Rectal: Deferred until colonoscopy Extremities: no clubbing, cyanosis, or lower extremity edema  bilaterally Skin: no lesions on visible extremities Neuro: No focal deficits.  Cranial nerves intact.  Normal DTRs  ASSESSMENT:  1.  Prior problems with left lower quadrant pain.  Resolved.  Negative GYN evaluation and negative CT scan.  Etiology unclear.  [place] have had mild diverticulitis which resolved prior to her CT scan.  Possibly spasm or musculoskeletal.  Fortunately no recurrent problems in 4 months 2.  Diverticulosis 3.  Screening  colonoscopy.  Appropriate candidate without contraindication.  Last examination 2003- for neoplasia   PLAN:  1.  Daily fiber supplementation 2.  Discussion on diverticular disease 3.  Screening colonoscopy.The nature of the procedure, as well as the risks, benefits, and alternatives were carefully and thoroughly reviewed with the patient. Ample time for discussion and questions allowed. The patient understood, was satisfied, and agreed to proceed.

## 2018-07-14 ENCOUNTER — Other Ambulatory Visit (INDEPENDENT_AMBULATORY_CARE_PROVIDER_SITE_OTHER): Payer: Medicare Other

## 2018-07-14 DIAGNOSIS — I10 Essential (primary) hypertension: Secondary | ICD-10-CM

## 2018-07-14 DIAGNOSIS — R739 Hyperglycemia, unspecified: Secondary | ICD-10-CM

## 2018-07-14 DIAGNOSIS — E119 Type 2 diabetes mellitus without complications: Secondary | ICD-10-CM | POA: Diagnosis not present

## 2018-07-14 DIAGNOSIS — E785 Hyperlipidemia, unspecified: Secondary | ICD-10-CM | POA: Diagnosis not present

## 2018-07-14 LAB — URINALYSIS
Bilirubin Urine: NEGATIVE
Hgb urine dipstick: NEGATIVE
Ketones, ur: NEGATIVE
Leukocytes, UA: NEGATIVE
Nitrite: NEGATIVE
Specific Gravity, Urine: <=1.005 — AB (ref 1.000–1.030)
Total Protein, Urine: NEGATIVE
Urine Glucose: NEGATIVE
Urobilinogen, UA: 0.2 (ref 0.0–1.0)
pH: 7.5 (ref 5.0–8.0)

## 2018-07-14 LAB — CBC WITH DIFFERENTIAL/PLATELET
Basophils Absolute: 0.1 10*3/uL (ref 0.0–0.1)
Basophils Relative: 1.2 % (ref 0.0–3.0)
Eosinophils Absolute: 0.1 10*3/uL (ref 0.0–0.7)
Eosinophils Relative: 1.3 % (ref 0.0–5.0)
HCT: 42.1 % (ref 36.0–46.0)
Hemoglobin: 14.4 g/dL (ref 12.0–15.0)
Lymphocytes Relative: 40.6 % (ref 12.0–46.0)
Lymphs Abs: 1.9 10*3/uL (ref 0.7–4.0)
MCHC: 34.2 g/dL (ref 30.0–36.0)
MCV: 87.4 fl (ref 78.0–100.0)
Monocytes Absolute: 0.4 10*3/uL (ref 0.1–1.0)
Monocytes Relative: 8.7 % (ref 3.0–12.0)
Neutro Abs: 2.3 10*3/uL (ref 1.4–7.7)
Neutrophils Relative %: 48.2 % (ref 43.0–77.0)
Platelets: 312 10*3/uL (ref 150.0–400.0)
RBC: 4.81 Mil/uL (ref 3.87–5.11)
RDW: 13 % (ref 11.5–15.5)
WBC: 4.7 10*3/uL (ref 4.0–10.5)

## 2018-07-14 LAB — HEPATIC FUNCTION PANEL
ALBUMIN: 4.1 g/dL (ref 3.5–5.2)
ALT: 19 U/L (ref 0–35)
AST: 15 U/L (ref 0–37)
Alkaline Phosphatase: 58 U/L (ref 39–117)
Bilirubin, Direct: 0.1 mg/dL (ref 0.0–0.3)
Total Bilirubin: 0.4 mg/dL (ref 0.2–1.2)
Total Protein: 7.1 g/dL (ref 6.0–8.3)

## 2018-07-14 LAB — BASIC METABOLIC PANEL
BUN: 14 mg/dL (ref 6–23)
CO2: 28 mEq/L (ref 19–32)
CREATININE: 0.65 mg/dL (ref 0.40–1.20)
Calcium: 9.6 mg/dL (ref 8.4–10.5)
Chloride: 102 mEq/L (ref 96–112)
GFR: 95.11 mL/min (ref >60.00)
GLUCOSE: 103 mg/dL — AB (ref 70–99)
Potassium: 4.2 mEq/L (ref 3.5–5.1)
Sodium: 137 mEq/L (ref 135–145)

## 2018-07-14 LAB — LIPID PANEL
CHOLESTEROL: 223 mg/dL — AB (ref 0–200)
HDL: 52.2 mg/dL (ref >39.00)
LDL CALC: 143 mg/dL — AB (ref 0–99)
NonHDL: 170.56
TRIGLYCERIDES: 140 mg/dL (ref 0.0–149.0)
Total CHOL/HDL Ratio: 4
VLDL: 28 mg/dL (ref 0.0–40.0)

## 2018-07-14 LAB — HEMOGLOBIN A1C: Hgb A1c MFr Bld: 5.9 % (ref 4.6–6.5)

## 2018-07-14 LAB — TSH: TSH: 0.65 u[IU]/mL (ref 0.35–4.50)

## 2018-07-16 ENCOUNTER — Encounter: Payer: Self-pay | Admitting: Internal Medicine

## 2018-07-16 ENCOUNTER — Ambulatory Visit (INDEPENDENT_AMBULATORY_CARE_PROVIDER_SITE_OTHER): Payer: Medicare Other | Admitting: Internal Medicine

## 2018-07-16 VITALS — BP 136/82 | HR 80 | Temp 97.9°F | Ht 61.0 in | Wt 130.0 lb

## 2018-07-16 DIAGNOSIS — E785 Hyperlipidemia, unspecified: Secondary | ICD-10-CM | POA: Diagnosis not present

## 2018-07-16 DIAGNOSIS — Z0000 Encounter for general adult medical examination without abnormal findings: Secondary | ICD-10-CM

## 2018-07-16 DIAGNOSIS — R0989 Other specified symptoms and signs involving the circulatory and respiratory systems: Secondary | ICD-10-CM | POA: Diagnosis not present

## 2018-07-16 MED ORDER — ZOSTER VAC RECOMB ADJUVANTED 50 MCG/0.5ML IM SUSR: 0.5000 mL | Freq: Once | INTRAMUSCULAR | 1 refills | Status: AC

## 2018-07-16 MED ORDER — LISINOPRIL-HYDROCHLOROTHIAZIDE 10-12.5 MG PO TABS: 1.0000 | ORAL_TABLET | Freq: Every day | ORAL | 3 refills | Status: DC

## 2018-07-16 MED ORDER — ICOSAPENT ETHYL 1 G PO CAPS: 2.0000 | ORAL_CAPSULE | Freq: Two times a day (BID) | ORAL | 11 refills | Status: DC

## 2018-07-16 NOTE — Assessment & Plan Note (Signed)
Labs discussed CT Ca scoring discussed

## 2018-07-16 NOTE — Progress Notes (Signed)
Subjective:  Patient ID: Lisa [name], female    DOB: 28-Oct-1945  Age: 72 y.o. MRN: [id number]  CC: No chief complaint on file.   HPI Lisa [name] presents for a well exam F/u dyslipidemia, knee OA    Outpatient Medications Prior to Visit  Medication Sig Dispense Refill  . cholecalciferol (VITAMIN D) 1000 UNITS tablet Take 2,000 Units by mouth daily.     . cyanocobalamin 1000 MCG tablet Take 1,000 mcg by mouth daily.    . Glucosamine HCl 500 MG TABS Take by mouth 2 (two) times daily.     Marland Kitchen ibuprofen (ADVIL,MOTRIN) 100 MG tablet Take 100 mg by mouth every 6 (six) hours as needed for pain.    Marland Kitchen lisinopril-hydrochlorothiazide (PRINZIDE,ZESTORETIC) 10-12.5 MG tablet TAKE 1 TABLET BY MOUTH DAILY. 90 tablet 3  . Turmeric 500 MG TABS Take 2 capsules by mouth 2 (two) times daily.    Marland Kitchen VASCEPA 1 g CAPS TAKE 2 CAPSULES BY MOUTH TWICE A DAY 120 capsule 11  . vitamin C (ASCORBIC ACID) 500 MG tablet Take 500 mg by mouth daily as needed.     No facility-administered medications prior to visit.     ROS: Review of Systems  Constitutional: Negative for activity change, appetite change, chills, fatigue and unexpected weight change.  HENT: Negative for congestion, mouth sores and sinus pressure.   Eyes: Negative for visual disturbance.  Respiratory: Negative for cough and chest tightness.   Gastrointestinal: Negative for abdominal pain and nausea.  Genitourinary: Negative for difficulty urinating, frequency and vaginal pain.  Musculoskeletal: Negative for back pain and gait problem.  Skin: Negative for pallor and rash.  Neurological: Negative for dizziness, tremors, weakness, numbness and headaches.  Psychiatric/Behavioral: Negative for confusion, sleep disturbance and suicidal ideas.    Objective:  BP 136/82 (BP Location: Left Arm, Patient Position: Sitting, Cuff Size: Normal)   Pulse 80   Temp 97.9 F (36.6 C) (Oral)   Ht 5\' 1"  (1.549 m)   Wt 130 lb (59 kg)   SpO2 98%   BMI 24.56  kg/m   BP Readings from Last 3 Encounters:  07/16/18 136/82  07/03/18 130/70  05/01/18 124/78    Wt Readings from Last 3 Encounters:  07/16/18 130 lb (59 kg)  07/03/18 133 lb (60.3 kg)  05/01/18 132 lb 0.6 oz (59.9 kg)    Physical Exam  Constitutional: She appears well-developed. No distress.  HENT:  Head: Normocephalic.  Right Ear: External ear normal.  Left Ear: External ear normal.  Nose: Nose normal.  Mouth/Throat: Oropharynx is clear and moist.  Eyes: Pupils are equal, round, and reactive to light. Conjunctivae are normal. Right eye exhibits no discharge. Left eye exhibits no discharge.  Neck: Normal range of motion. Neck supple. No JVD present. No tracheal deviation present. No thyromegaly present.  Cardiovascular: Normal rate, regular rhythm and normal heart sounds.  Pulmonary/Chest: No stridor. No respiratory distress. She has no wheezes.  Abdominal: Soft. Bowel sounds are normal. She exhibits no distension and no mass. There is no tenderness. There is no rebound and no guarding.  Musculoskeletal: She exhibits no edema or tenderness.  Lymphadenopathy:    She has no cervical adenopathy.  Neurological: She displays normal reflexes. No cranial nerve deficit. She exhibits normal muscle tone. Coordination normal.  Skin: No rash noted. No erythema.  Psychiatric: She has a normal mood and affect. Her behavior is normal. Judgment and thought content normal.  R bruit  Lab Results  Component Value Date  WBC 4.7 07/14/2018   HGB 14.4 07/14/2018   HCT 42.1 07/14/2018   PLT 312.0 07/14/2018   GLUCOSE 103 (H) 07/14/2018   CHOL 223 (H) 07/14/2018   TRIG 140.0 07/14/2018   HDL 52.20 07/14/2018   LDLDIRECT 170.0 06/26/2017   LDLCALC 143 (H) 07/14/2018   ALT 19 07/14/2018   AST 15 07/14/2018   NA 137 07/14/2018   K 4.2 07/14/2018   CL 102 07/14/2018   CREATININE 0.65 07/14/2018   BUN 14 07/14/2018   CO2 28 07/14/2018   TSH 0.65 07/14/2018   HGBA1C 5.9 07/14/2018     Ct Abdomen Pelvis W Contrast  Result Date: 02/21/2018 CLINICAL DATA:  Abdominal pain, bloating EXAM: CT ABDOMEN AND PELVIS WITH CONTRAST TECHNIQUE: Multidetector CT imaging of the abdomen and pelvis was performed using the standard protocol following bolus administration of intravenous contrast. CONTRAST:  141mL ISOVUE-300 IOPAMIDOL (ISOVUE-300) INJECTION 61% COMPARISON:  Pelvic ultrasound 02/03/2018 FINDINGS: Lower chest: Lung bases are clear. No effusions. Heart is normal size. Hepatobiliary: Diffuse fatty infiltration of the liver. 1.9 cm low-density lesion in the inferior right hepatic lobe and 1.9 cm low-density lesion in the anterior right hepatic lobe. 2 cm low-density lesion in the lateral segment of the left hepatic lobe. These are most compatible with cysts. Gallbladder unremarkable. Pancreas: No focal abnormality or ductal dilatation. Spleen: No focal abnormality.  Normal size. Adrenals/Urinary Tract: No adrenal abnormality. No focal renal abnormality. No stones or hydronephrosis. Urinary bladder is unremarkable. Stomach/Bowel: Scattered sigmoid diverticulosis. No active diverticulitis. Stomach and small bowel decompressed, unremarkable. Vascular/Lymphatic: Aortic atherosclerosis. No enlarged abdominal or pelvic lymph nodes. Reproductive: Uterus and adnexa unremarkable.  No mass. Other: No free fluid or free air. Musculoskeletal: No acute bony abnormality. Degenerative disc and facet disease in the lumbar spine. IMPRESSION: Fatty infiltration of the liver.  Scattered hepatic cysts. Scattered sigmoid diverticulosis.  No active diverticulitis. No acute findings in the abdomen or pelvis. Electronically Signed   By: Rolm Baptise M.D.   On: 02/21/2018 11:00    Assessment & Plan:   There are no diagnoses linked to this encounter.   No orders of the defined types were placed in this encounter.    Follow-up: No follow-ups on file.  Walker Kehr, MD

## 2018-07-16 NOTE — Patient Instructions (Signed)
Cardiac CT calcium scoring test $150   Computed tomography, more commonly known as a CT or CAT scan, is a diagnostic medical imaging test. Like traditional x-rays, it produces multiple images or pictures of the inside of the body. The cross-sectional images generated during a CT scan can be reformatted in multiple planes. They can even generate three-dimensional images. These images can be viewed on a computer monitor, printed on film or by a 3D printer, or transferred to a CD or DVD. CT images of internal organs, bones, soft tissue and blood vessels provide greater detail than traditional x-rays, particularly of soft tissues and blood vessels. A cardiac CT scan for coronary calcium is a non-invasive way of obtaining information about the presence, location and extent of calcified plaque in the coronary arteries-the vessels that supply oxygen-containing blood to the heart muscle. Calcified plaque results when there is a build-up of fat and other substances under the inner layer of the artery. This material can calcify which signals the presence of atherosclerosis, a disease of the vessel wall, also called coronary artery disease (CAD). People with this disease have an increased risk for heart attacks. In addition, over time, progression of plaque build up (CAD) can narrow the arteries or even close off blood flow to the heart. The result Moor be chest pain, sometimes called "angina," or a heart attack. Because calcium is a marker of CAD, the amount of calcium detected on a cardiac CT scan is a helpful prognostic tool. The findings on cardiac CT are expressed as a calcium score. Another name for this test is coronary artery calcium scoring.  What are some common uses of the procedure? The goal of cardiac CT scan for calcium scoring is to determine if CAD is present and to what extent, even if there are no symptoms. It is a screening study that [place] be recommended by a physician for patients with risk factors  for CAD but no clinical symptoms. The major risk factors for CAD are: . high blood cholesterol levels  . family history of heart attacks  . diabetes  . high blood pressure  . cigarette smoking  . overweight or obese  . physical inactivity   A negative cardiac CT scan for calcium scoring shows no calcification within the coronary arteries. This suggests that CAD is absent or so minimal it cannot be seen by this technique. The chance of having a heart attack over the next two to five years is very low under these circumstances. A positive test means that CAD is present, regardless of whether or not the patient is experiencing any symptoms. The amount of calcification-expressed as the calcium score-[place] help to predict the likelihood of a myocardial infarction (heart attack) in the coming years and helps your medical doctor or cardiologist decide whether the patient [place] need to take preventive medicine or undertake other measures such as diet and exercise to lower the risk for heart attack. The extent of CAD is graded according to your calcium score:  Calcium Score  Presence of CAD  0 No evidence of CAD   1-10 Minimal evidence of CAD  11-100 Mild evidence of CAD  101-400 Moderate evidence of CAD  Over 400 Extensive evidence of CAD    

## 2018-07-16 NOTE — Assessment & Plan Note (Signed)
[place]

## 2018-07-16 NOTE — Assessment & Plan Note (Addendum)
Here for medicare wellness/physical  Diet: heart healthy  Physical activity: not sedentary  Depression/mood screen: negative  Hearing: intact to whispered voice  Visual acuity: grossly normal w/glasses, performs annual eye exam  ADLs: capable  Fall risk: low to none  Home safety: good  Cognitive evaluation: intact to orientation, naming, recall and repetition  EOL planning: adv directives, full code/ I agree  I have personally reviewed and have noted  1. The patient's medical, surgical and social history  2. Their use of alcohol, tobacco or illicit drugs  3. Their current medications and supplements  4. The patient's functional ability including ADL's, fall risks, home safety risks and hearing or visual impairment.  5. Diet and physical activities  6. Evidence for depression or mood disorders 7. The roster of all physicians providing medical care to patient - is listed in the Snapshot section of the chart and reviewed today.    Today patient counseled on age appropriate routine health concerns for screening and prevention, each reviewed and up to date or declined. Immunizations reviewed and up to date or declined. Labs ordered and reviewed. Risk factors for depression reviewed and negative. Hearing function and visual acuity are intact. ADLs screened and addressed as needed. Functional ability and level of safety reviewed and appropriate. Education, counseling and referrals performed based on assessed risks today. Patient provided with a copy of personalized plan for preventive services.  Shingrix Rx Colonoscopy due 10/19 scheduled

## 2018-07-18 ENCOUNTER — Encounter: Payer: Self-pay | Admitting: Internal Medicine

## 2018-07-21 NOTE — Addendum Note (Signed)
Addended by: Karren Cobble on: 07/21/2018 11:19 AM   Modules accepted: Orders

## 2018-07-22 NOTE — Addendum Note (Signed)
Addended by: Karren Cobble on: 07/22/2018 08:14 AM   Modules accepted: Orders

## 2018-07-28 ENCOUNTER — Ambulatory Visit
Admission: RE | Admit: 2018-07-28 | Discharge: 2018-07-28 | Disposition: A | Discharge status: Home / Self Care | Payer: Medicare Other | Source: Ambulatory Visit | Attending: Internal Medicine | Admitting: Internal Medicine

## 2018-07-28 DIAGNOSIS — Z1231 Encounter for screening mammogram for malignant neoplasm of breast: Secondary | ICD-10-CM

## 2018-07-29 ENCOUNTER — Encounter: Payer: Self-pay | Admitting: Internal Medicine

## 2018-07-29 ENCOUNTER — Encounter (HOSPITAL_COMMUNITY): Payer: Medicare Other

## 2018-07-29 ENCOUNTER — Ambulatory Visit (AMBULATORY_SURGERY_CENTER): Payer: Medicare Other | Admitting: Internal Medicine

## 2018-07-29 VITALS — BP 134/63 | HR 66 | Temp 97.5°F | Resp 16 | Ht 61.0 in | Wt 130.0 lb

## 2018-07-29 DIAGNOSIS — Z1211 Encounter for screening for malignant neoplasm of colon: Secondary | ICD-10-CM

## 2018-07-29 MED ORDER — SODIUM CHLORIDE 0.9 % IV SOLN: 500.0000 mL | Freq: Once | INTRAVENOUS | Status: DC

## 2018-07-29 NOTE — Progress Notes (Signed)
Pt's states no medical or surgical changes since previsit or office visit. 

## 2018-07-29 NOTE — Patient Instructions (Signed)
Handout  Given  ; Diverticulosis  YOU HAD AN ENDOSCOPIC PROCEDURE TODAY AT [facility]:   Refer to the procedure report that was given to you for any specific questions about what was found during the examination.  If the procedure report does not answer your questions, please call your gastroenterologist to clarify.  If you requested that your care partner not be given the details of your procedure findings, then the procedure report has been included in a sealed envelope for you to review at your convenience later.  YOU SHOULD EXPECT: Some feelings of bloating in the abdomen. Passage of more gas than usual.  Walking can help get rid of the air that was put into your GI tract during the procedure and reduce the bloating. If you had a lower endoscopy (such as a colonoscopy or flexible sigmoidoscopy) you [place] notice spotting of blood in your stool or on the toilet paper. If you underwent a bowel prep for your procedure, you Lasch not have a normal bowel movement for a few days.  Please Note:  You might notice some irritation and congestion in your nose or some drainage.  This is from the oxygen used during your procedure.  There is no need for concern and it should clear up in a day or so.  SYMPTOMS TO REPORT IMMEDIATELY:   Following lower endoscopy (colonoscopy or flexible sigmoidoscopy):  Excessive amounts of blood in the stool  Significant tenderness or worsening of abdominal pains  Swelling of the abdomen that is new, acute  Fever of 100F or higher  For urgent or emergent issues, a gastroenterologist can be reached at any hour by calling [phone].   DIET:  We do recommend a small meal at first, but then you Jury proceed to your regular diet.  Drink plenty of fluids but you should avoid alcoholic beverages for 24 hours.  ACTIVITY:  You should plan to take it easy for the rest of today and you should NOT DRIVE or use heavy machinery until tomorrow (because of the sedation  medicines used during the test).    FOLLOW UP: Our staff will call the number listed on your records the next business day following your procedure to check on you and address any questions or concerns that you [place] have regarding the information given to you following your procedure. If we do not reach you, we will leave a message.  However, if you are feeling well and you are not experiencing any problems, there is no need to return our call.  We will assume that you have returned to your regular daily activities without incident.  If any biopsies were taken you will be contacted by phone or by letter within the next 1-3 weeks.  Please call us at [phone] if you have not heard about the biopsies in 3 weeks.    SIGNATURES/CONFIDENTIALITY: You and/or your care partner have signed paperwork which will be entered into your electronic medical record.  These signatures attest to the fact that that the information above on your After Visit Summary has been reviewed and is understood.  Full responsibility of the confidentiality of this discharge information lies with you and/or your care-partner.

## 2018-07-29 NOTE — Op Note (Signed)
[facility] Patient Name: Lisa [name] Procedure Date: 07/29/2018 2:02 PM MRN: [id number] Endoscopist: Docia Chuck. Henrene Pastor , MD Age: 72 Referring MD:  Date of Birth: October 03, 1945 Gender: Female Account #: [id number] Procedure:                Colonoscopy Indications:              Screening for colorectal malignant neoplasm Medicines:                Monitored Anesthesia Care Procedure:                Pre-Anesthesia Assessment:                           - Prior to the procedure, a History and Physical                            was performed, and patient medications and                            allergies were reviewed. The patient's tolerance of                            previous anesthesia was also reviewed. The risks                            and benefits of the procedure and the sedation                            options and risks were discussed with the patient.                            All questions were answered, and informed consent                            was obtained. Prior Anticoagulants: The patient has                            taken no previous anticoagulant or antiplatelet                            agents. ASA Grade Assessment: II - A patient with                            mild systemic disease. After reviewing the risks                            and benefits, the patient was deemed in                            satisfactory condition to undergo the procedure.                           After obtaining informed consent, the colonoscope  was passed under direct vision. Throughout the                            procedure, the patient's blood pressure, pulse, and                            oxygen saturations were monitored continuously. The                            Colonoscope was introduced through the anus and                            advanced to the the cecum, identified by                            appendiceal orifice and  ileocecal valve. The                            ileocecal valve, appendiceal orifice, and rectum                            were photographed. The quality of the bowel                            preparation was excellent. The colonoscopy was                            performed without difficulty. The patient tolerated                            the procedure well. The bowel preparation used was                            SUPREP. Scope In: 2:09:31 PM Scope Out: 2:24:49 PM Scope Withdrawal Time: 0 hours 11 minutes 26 seconds  Total Procedure Duration: 0 hours 15 minutes 18 seconds  Findings:                 Multiple diverticula were found in the sigmoid                            colon.                           The exam was otherwise without abnormality on                            direct and retroflexion views. Complications:            No immediate complications. Estimated blood loss:                            None. Estimated Blood Loss:     Estimated blood loss: none. Impression:               - Diverticulosis in the sigmoid colon.                           -  The examination was otherwise normal on direct                            and retroflexion views.                           - No specimens collected. Recommendation:           - Repeat colonoscopy is not recommended due to                            current age (1 years or older) for screening                            purposes.                           - Patient has a contact number available for                            emergencies. The signs and symptoms of potential                            delayed complications were discussed with the                            patient. Return to normal activities tomorrow.                            Written discharge instructions were provided to the                            patient.                           - Resume previous diet.                           - Continue present  medications. Docia Chuck. Henrene Pastor, MD 07/29/2018 2:27:53 PM This report has been signed electronically.

## 2018-07-29 NOTE — Progress Notes (Signed)
Report to PACU, RN, vss, BBS= Clear.  

## 2018-07-30 ENCOUNTER — Telehealth: Payer: Self-pay

## 2018-07-30 NOTE — Telephone Encounter (Signed)
  Follow up Call-  Call back number 07/29/2018  Post procedure Call Back phone  # [phone]  Permission to leave phone message Yes  Some recent data might be hidden     Patient questions:  Do you have a fever, pain , or abdominal swelling? No. Pain Score  0 *  Have you tolerated food without any problems? Yes.    Have you been able to return to your normal activities? Yes.    Do you have any questions about your discharge instructions: Diet   No. Medications  No. Follow up visit  No.  Do you have questions or concerns about your Care? No.  Actions: * If pain score is 4 or above: No action needed, pain <4.

## 2018-07-31 ENCOUNTER — Ambulatory Visit (HOSPITAL_COMMUNITY)
Admission: RE | Admit: 2018-07-31 | Discharge: 2018-07-31 | Disposition: A | Discharge status: Home / Self Care | Payer: Medicare Other | Source: Ambulatory Visit | Attending: Internal Medicine | Admitting: Internal Medicine

## 2018-07-31 DIAGNOSIS — R0989 Other specified symptoms and signs involving the circulatory and respiratory systems: Secondary | ICD-10-CM | POA: Insufficient documentation

## 2018-07-31 NOTE — Progress Notes (Signed)
VASCULAR LAB PRELIMINARY  PRELIMINARY  PRELIMINARY  PRELIMINARY  Carotid duplex completed.    Preliminary report:  1-39% ICA plaquing. Vertebral artery flow is antegrade. ECA velocities are elevated, bilaterally.  Nimrit Kehres, RVT 07/31/2018, 10:24 AM

## 2018-08-01 ENCOUNTER — Encounter (HOSPITAL_COMMUNITY): Payer: Medicare Other

## 2019-01-15 ENCOUNTER — Ambulatory Visit: Payer: Medicare Other | Admitting: Internal Medicine

## 2019-03-03 ENCOUNTER — Other Ambulatory Visit (INDEPENDENT_AMBULATORY_CARE_PROVIDER_SITE_OTHER): Payer: Medicare Other

## 2019-03-03 DIAGNOSIS — R739 Hyperglycemia, unspecified: Secondary | ICD-10-CM

## 2019-03-03 DIAGNOSIS — I10 Essential (primary) hypertension: Secondary | ICD-10-CM

## 2019-03-03 DIAGNOSIS — E785 Hyperlipidemia, unspecified: Secondary | ICD-10-CM

## 2019-03-03 LAB — BASIC METABOLIC PANEL
BUN: 12 mg/dL (ref 6–23)
CO2: 27 mEq/L (ref 19–32)
Calcium: 9.4 mg/dL (ref 8.4–10.5)
Chloride: 99 mEq/L (ref 96–112)
Creatinine, Ser: 0.6 mg/dL (ref 0.40–1.20)
GFR: 97.97 mL/min (ref >60.00)
Glucose, Bld: 101 mg/dL — ABNORMAL HIGH (ref 70–99)
Potassium: 4.7 mEq/L (ref 3.5–5.1)
Sodium: 134 mEq/L — ABNORMAL LOW (ref 135–145)

## 2019-03-03 LAB — LIPID PANEL
Cholesterol: 223 mg/dL — ABNORMAL HIGH (ref 0–200)
HDL: 51.3 mg/dL (ref >39.00)
LDL Cholesterol: 147 mg/dL — ABNORMAL HIGH (ref 0–99)
NonHDL: 171.43
Total CHOL/HDL Ratio: 4
Triglycerides: 122 mg/dL (ref 0.0–149.0)
VLDL: 24.4 mg/dL (ref 0.0–40.0)

## 2019-03-03 LAB — HEMOGLOBIN A1C: Hgb A1c MFr Bld: 6.1 % (ref 4.6–6.5)

## 2019-03-05 ENCOUNTER — Other Ambulatory Visit: Payer: Self-pay

## 2019-03-05 ENCOUNTER — Encounter: Payer: Self-pay | Admitting: Internal Medicine

## 2019-03-05 ENCOUNTER — Ambulatory Visit (INDEPENDENT_AMBULATORY_CARE_PROVIDER_SITE_OTHER): Payer: Medicare Other | Admitting: Internal Medicine

## 2019-03-05 DIAGNOSIS — E119 Type 2 diabetes mellitus without complications: Secondary | ICD-10-CM

## 2019-03-05 DIAGNOSIS — I10 Essential (primary) hypertension: Secondary | ICD-10-CM | POA: Diagnosis not present

## 2019-03-05 DIAGNOSIS — E559 Vitamin D deficiency, unspecified: Secondary | ICD-10-CM

## 2019-03-05 DIAGNOSIS — E785 Hyperlipidemia, unspecified: Secondary | ICD-10-CM

## 2019-03-05 MED ORDER — LISINOPRIL-HYDROCHLOROTHIAZIDE 10-12.5 MG PO TABS: 1.0000 | ORAL_TABLET | Freq: Every day | ORAL | 3 refills | Status: DC

## 2019-03-05 MED ORDER — VASCEPA 1 G PO CAPS: 2.0000 | ORAL_CAPSULE | Freq: Two times a day (BID) | ORAL | 3 refills | Status: DC

## 2019-03-05 NOTE — Assessment & Plan Note (Signed)
CT coronary calcium score info given

## 2019-03-05 NOTE — Patient Instructions (Signed)
Cardiac CT calcium scoring test $150   Computed tomography, more commonly known as a CT or CAT scan, is a diagnostic medical imaging test. Like traditional x-rays, it produces multiple images or pictures of the inside of the body. The cross-sectional images generated during a CT scan can be reformatted in multiple planes. They can even generate three-dimensional images. These images can be viewed on a computer monitor, printed on film or by a 3D printer, or transferred to a CD or DVD. CT images of internal organs, bones, soft tissue and blood vessels provide greater detail than traditional x-rays, particularly of soft tissues and blood vessels. A cardiac CT scan for coronary calcium is a non-invasive way of obtaining information about the presence, location and extent of calcified plaque in the coronary arteries-the vessels that supply oxygen-containing blood to the heart muscle. Calcified plaque results when there is a build-up of fat and other substances under the inner layer of the artery. This material can calcify which signals the presence of atherosclerosis, a disease of the vessel wall, also called coronary artery disease (CAD). People with this disease have an increased risk for heart attacks. In addition, over time, progression of plaque build up (CAD) can narrow the arteries or even close off blood flow to the heart. The result [place]an be chest pain, sometimes called "angina," or a heart attack. Because calcium is a marker of CAD, the amount of calcium detected on a cardiac CT scan is a helpful prognostic tool. The findings on cardiac CT are expressed as a calcium score. Another name for this test is coronary artery calcium scoring.  What are some common uses of the procedure? The goal of cardiac CT scan for calcium scoring is to determine if CAD is present and to what extent, even if there are no symptoms. It is a screening study that [place] be recommended by a physician for patients with risk factors  for CAD but no clinical symptoms. The major risk factors for CAD are: . high blood cholesterol levels  . family history of heart attacks  . diabetes  . high blood pressure  . cigarette smoking  . overweight or obese  . physical inactivity   A negative cardiac CT scan for calcium scoring shows no calcification within the coronary arteries. This suggests that CAD is absent or so minimal it cannot be seen by this technique. The chance of having a heart attack over the next two to five years is very low under these circumstances. A positive test means that CAD is present, regardless of whether or not the patient is experiencing any symptoms. The amount of calcification-expressed as the calcium score-[place] help to predict the likelihood of a myocardial infarction (heart attack) in the coming years and helps your medical doctor or cardiologist decide whether the patient [place] need to take preventive medicine or undertake other measures such as diet and exercise to lower the risk for heart attack. The extent of CAD is graded according to your calcium score:  Calcium Score  Presence of CAD  0 No evidence of CAD   1-10 Minimal evidence of CAD  11-100 Mild evidence of CAD  101-400 Moderate evidence of CAD  Over 400 Extensive evidence of CAD    

## 2019-03-05 NOTE — Assessment & Plan Note (Signed)
CT coronary calcium score info given  [place]

## 2019-03-05 NOTE — Assessment & Plan Note (Signed)
CT coronary calcium score info given 6/20

## 2019-03-05 NOTE — Assessment & Plan Note (Signed)
Prinizide 

## 2019-03-05 NOTE — Progress Notes (Signed)
Subjective:  Patient ID: Lisa [name], female    DOB: 07-12-1946  Age: 73 y.o. MRN: [id number]  CC: No chief complaint on file.   HPI Lisa [name] presents for dyslipidemia, HTN, B12 def f/u  Outpatient Medications Prior to Visit  Medication Sig Dispense Refill  . cholecalciferol (VITAMIN D) 1000 UNITS tablet Take 2,000 Units by mouth daily.     . cyanocobalamin 1000 MCG tablet Take 1,000 mcg by mouth daily.    . Glucosamine HCl 500 MG TABS Take by mouth 2 (two) times daily.     Marland Kitchen ibuprofen (ADVIL,MOTRIN) 100 MG tablet Take 100 mg by mouth every 6 (six) hours as needed for pain.    Vanessa Kick Ethyl (VASCEPA) 1 g CAPS Take 2 capsules (2 g total) by mouth 2 (two) times daily. 120 capsule 11  . lisinopril-hydrochlorothiazide (PRINZIDE,ZESTORETIC) 10-12.5 MG tablet Take 1 tablet by mouth daily. 90 tablet 3  . Turmeric 500 MG TABS Take 2 capsules by mouth 2 (two) times daily.    . vitamin C (ASCORBIC ACID) 500 MG tablet Take 500 mg by mouth daily as needed.     No facility-administered medications prior to visit.     ROS: Review of Systems  Constitutional: Negative for activity change, appetite change, chills, fatigue and unexpected weight change.  HENT: Negative for congestion, mouth sores and sinus pressure.   Eyes: Negative for visual disturbance.  Respiratory: Negative for cough and chest tightness.   Gastrointestinal: Negative for abdominal pain and nausea.  Genitourinary: Negative for difficulty urinating, frequency and vaginal pain.  Musculoskeletal: Positive for arthralgias. Negative for back pain and gait problem.  Skin: Negative for pallor and rash.  Neurological: Negative for dizziness, tremors, weakness, numbness and headaches.  Psychiatric/Behavioral: Negative for confusion, sleep disturbance and suicidal ideas.    Objective:  BP 130/78 (BP Location: Left Arm, Patient Position: Sitting, Cuff Size: Normal)   Pulse 73   Temp 98.2 F (36.8 C) (Oral)   Ht 5\' 1"   (1.549 m)   Wt 133 lb (60.3 kg)   SpO2 98%   BMI 25.13 kg/m   BP Readings from Last 3 Encounters:  03/05/19 130/78  07/29/18 134/63  07/16/18 136/82    Wt Readings from Last 3 Encounters:  03/05/19 133 lb (60.3 kg)  07/29/18 130 lb (59 kg)  07/16/18 130 lb (59 kg)    Physical Exam Constitutional:      General: She is not in acute distress.    Appearance: She is well-developed.  HENT:     Head: Normocephalic.     Right Ear: External ear normal.     Left Ear: External ear normal.     Nose: Nose normal.  Eyes:     General:        Right eye: No discharge.        Left eye: No discharge.     Conjunctiva/sclera: Conjunctivae normal.     Pupils: Pupils are equal, round, and reactive to light.  Neck:     Musculoskeletal: Normal range of motion and neck supple.     Thyroid: No thyromegaly.     Vascular: No JVD.     Trachea: No tracheal deviation.  Cardiovascular:     Rate and Rhythm: Normal rate and regular rhythm.     Heart sounds: Normal heart sounds.  Pulmonary:     Effort: No respiratory distress.     Breath sounds: No stridor. No wheezing.  Abdominal:     General: Bowel  sounds are normal. There is no distension.     Palpations: Abdomen is soft. There is no mass.     Tenderness: There is no abdominal tenderness. There is no guarding or rebound.  Musculoskeletal:        General: No tenderness.  Lymphadenopathy:     Cervical: No cervical adenopathy.  Skin:    Findings: No erythema or rash.  Neurological:     Mental Status: She is oriented to person, place, and time.     Cranial Nerves: No cranial nerve deficit.     Motor: No abnormal muscle tone.     Coordination: Coordination normal.     Deep Tendon Reflexes: Reflexes normal.  Psychiatric:        Behavior: Behavior normal.        Thought Content: Thought content normal.        Judgment: Judgment normal.     Lab Results  Component Value Date   WBC 4.7 07/14/2018   HGB 14.4 07/14/2018   HCT 42.1  07/14/2018   PLT 312.0 07/14/2018   GLUCOSE 101 (H) 03/03/2019   CHOL 223 (H) 03/03/2019   TRIG 122.0 03/03/2019   HDL 51.30 03/03/2019   LDLDIRECT 170.0 06/26/2017   LDLCALC 147 (H) 03/03/2019   ALT 19 07/14/2018   AST 15 07/14/2018   NA 134 (L) 03/03/2019   K 4.7 03/03/2019   CL 99 03/03/2019   CREATININE 0.60 03/03/2019   BUN 12 03/03/2019   CO2 27 03/03/2019   TSH 0.65 07/14/2018   HGBA1C 6.1 03/03/2019    Vas US Carotid  Result Date: 07/31/2018 Carotid Arterial Duplex Study Indications:       Bruit. Risk Factors:      Hypertension, hyperlipidemia. Comparison Study:  No prior study on file Performing Technologist: Sharion Dove RVS  Examination Guidelines: A complete evaluation includes B-mode imaging, spectral Doppler, color Doppler, and power Doppler as needed of all accessible portions of each vessel. Bilateral testing is considered an integral part of a complete examination. Limited examinations for reoccurring indications [place] be performed as noted.  Right Carotid Findings: +----------+--------+--------+--------+------------+--------+           PSV cm/sEDV cm/sStenosisDescribe    Comments +----------+--------+--------+--------+------------+--------+ CCA Prox  78      11              homogeneous          +----------+--------+--------+--------+------------+--------+ CCA Distal70      14              homogeneous          +----------+--------+--------+--------+------------+--------+ ICA Prox  99      29              heterogenous         +----------+--------+--------+--------+------------+--------+ ICA Distal80      21                                   +----------+--------+--------+--------+------------+--------+ ECA       194     30                                   +----------+--------+--------+--------+------------+--------+ +----------+--------+-------+--------+-------------------+           PSV cm/sEDV cmsDescribeArm Pressure (mmHG)  +----------+--------+-------+--------+-------------------+ [id number]                                         +----------+--------+-------+--------+-------------------+ +---------+--------+--+--------+--+  VertebralPSV cm/s78EDV cm/s22 +---------+--------+--+--------+--+  Left Carotid Findings: +----------+--------+--------+--------+------------+------------------+           PSV cm/sEDV cm/sStenosisDescribe    Comments           +----------+--------+--------+--------+------------+------------------+ CCA Prox  88      17                          intimal thickening +----------+--------+--------+--------+------------+------------------+ CCA Distal69      15                          intimal thickening +----------+--------+--------+--------+------------+------------------+ ICA Prox  98      28              heterogenous                   +----------+--------+--------+--------+------------+------------------+ ICA Distal64      18                                             +----------+--------+--------+--------+------------+------------------+ ECA       174     28                                             +----------+--------+--------+--------+------------+------------------+ +----------+--------+--------+--------+-------------------+ SubclavianPSV cm/sEDV cm/sDescribeArm Pressure (mmHG) +----------+--------+--------+--------+-------------------+           228                                         +----------+--------+--------+--------+-------------------+ +---------+--------+--+--------+-+ VertebralPSV cm/s38EDV cm/s6 +---------+--------+--+--------+-+  Summary: Right Carotid: The extracranial vessels were near-normal with only minimal wall                thickening or plaque. Left Carotid: The extracranial vessels were near-normal with only minimal wall               thickening or plaque. Vertebrals:  Bilateral vertebral arteries demonstrate  antegrade flow. Subclavians: Normal flow hemodynamics were seen in bilateral subclavian              arteries. *See table(s) above for measurements and observations.  Electronically signed by Monica Martinez MD on 07/31/2018 at 4:33:13 PM.    Final     Assessment & Plan:   There are no diagnoses linked to this encounter.   No orders of the defined types were placed in this encounter.    Follow-up: No follow-ups on file.  Walker Kehr, MD

## 2019-07-02 ENCOUNTER — Other Ambulatory Visit: Payer: Self-pay

## 2019-07-02 ENCOUNTER — Emergency Department
Admission: EM | Admit: 2019-07-02 | Discharge: 2019-07-02 | Disposition: A | Discharge status: Home / Self Care | Payer: Medicare Other | Attending: Emergency Medicine | Admitting: Emergency Medicine

## 2019-07-02 ENCOUNTER — Encounter: Payer: Self-pay | Admitting: Emergency Medicine

## 2019-07-02 DIAGNOSIS — Z79899 Other long term (current) drug therapy: Secondary | ICD-10-CM | POA: Diagnosis not present

## 2019-07-02 DIAGNOSIS — S81011A Laceration without foreign body, right knee, initial encounter: Secondary | ICD-10-CM | POA: Diagnosis present

## 2019-07-02 DIAGNOSIS — W19XXXA Unspecified fall, initial encounter: Secondary | ICD-10-CM | POA: Diagnosis not present

## 2019-07-02 DIAGNOSIS — Y939 Activity, unspecified: Secondary | ICD-10-CM | POA: Insufficient documentation

## 2019-07-02 DIAGNOSIS — Y999 Unspecified external cause status: Secondary | ICD-10-CM | POA: Diagnosis not present

## 2019-07-02 DIAGNOSIS — Y929 Unspecified place or not applicable: Secondary | ICD-10-CM | POA: Diagnosis not present

## 2019-07-02 DIAGNOSIS — I10 Essential (primary) hypertension: Secondary | ICD-10-CM | POA: Insufficient documentation

## 2019-07-02 MED ORDER — LIDOCAINE HCL (PF) 1 % IJ SOLN
5.0000 mL | Freq: Once | INTRAMUSCULAR | Status: AC
  Administered 2019-07-02: 11:00:00 5 mL

## 2019-07-02 MED ORDER — LIDOCAINE HCL (PF) 1 % IJ SOLN
INTRAMUSCULAR | Status: AC
  Administered 2019-07-02: 5 mL
  Filled 2019-07-02: qty 5

## 2019-07-02 MED ORDER — ACETAMINOPHEN 325 MG PO TABS
650.0000 mg | ORAL_TABLET | Freq: Once | ORAL | Status: AC
  Administered 2019-07-02: 650 mg via ORAL
  Filled 2019-07-02: qty 2

## 2019-07-02 MED ORDER — BACITRACIN-NEOMYCIN-POLYMYXIN 400-5-5000 EX OINT
TOPICAL_OINTMENT | Freq: Once | CUTANEOUS | Status: AC
  Administered 2019-07-02: 11:00:00 via TOPICAL
  Filled 2019-07-02: qty 1

## 2019-07-02 NOTE — ED Triage Notes (Signed)
Fell on gravel road and lac to right knee.  She had twisted her ankle, but it is okay.

## 2019-07-02 NOTE — ED Provider Notes (Signed)
[facility] Emergency Department Provider Note   ____________________________________________   First MD Initiated Contact with Patient 07/02/19 1004     (approximate)  I have reviewed the triage vital signs and the nursing notes.   HISTORY  Chief Complaint Laceration    HPI Lisa [name] is a 73 y.o. female patient presents with a right knee laceration secondary to a slip and fall.  Patient said bleeding was controlled with direct pressure.  Patient denies loss of sensation or loss of function.  Patient rates the pain as a 3/10.  Patient describes the pain as "sore".  No other palliative measure for complaint.         Past Medical History:  Diagnosis Date  . Arthritis   . Cataract 2009   right-Dr. Gershon Crane  . Hyperlipidemia   . Hypertension   . Post-operative nausea and vomiting   . Retinal tear 2008   B- Dr. Charolette Forward  . Vitamin D deficiency     Patient Active Problem List   Diagnosis Date Noted  . Bruit of right carotid artery 07/16/2018  . Abdominal pain 01/06/2018  . Knee pain, right 12/21/2016  . Hip flexor tightness 11/30/2015  . Piriformis syndrome of left side 04/20/2014  . IT band syndrome 04/20/2014  . Frequent falls 04/20/2014  . Diabetes mellitus type 2 without retinopathy ([place]) 02/19/2013  . Well adult exam 07/13/2012  . Left lumbar radiculopathy 04/08/2012  . Macular hole 02/28/2012  . Status post intraocular lens implant 02/28/2012  . Vitamin D deficiency 04/30/2008  . Dyslipidemia 10/31/2007  . RECENT RETINAL DETACHMENT PARTIAL W/GIANT TEAR 10/31/2007  . Essential hypertension 10/31/2007    Past Surgical History:  Procedure Laterality Date  . APPENDECTOMY  1963  . CATARACT EXTRACTION     Dr. Gershon Crane  . POLYPECTOMY    . RETINAL TEAR REPAIR CRYOTHERAPY     Dr. Charolette Forward    Prior to Admission medications   Medication Sig Start Date End Date Taking? Authorizing Provider  cholecalciferol (VITAMIN D) 1000 UNITS  tablet Take 2,000 Units by mouth daily.     [provider]  cyanocobalamin 1000 MCG tablet Take 1,000 mcg by mouth daily.    [provider]  Glucosamine HCl 500 MG TABS Take by mouth 2 (two) times daily.     [provider]  ibuprofen (ADVIL,MOTRIN) 100 MG tablet Take 100 mg by mouth every 6 (six) hours as needed for pain.    [provider]  Icosapent Ethyl (VASCEPA) 1 g CAPS Take 2 capsules (2 g total) by mouth 2 (two) times daily. 03/05/19   [name], [name], MD  lisinopril-hydrochlorothiazide (ZESTORETIC) 10-12.5 MG tablet Take 1 tablet by mouth daily. 03/05/19   [name], [name], MD  Turmeric 500 MG TABS Take 2 capsules by mouth 2 (two) times daily.    [provider]  vitamin C (ASCORBIC ACID) 500 MG tablet Take 500 mg by mouth daily as needed.    [provider]    Allergies Atorvastatin, Ezetimibe-simvastatin, Fish oil, Livalo [pitavastatin calcium], Lovastatin, Rosuvastatin, and Zetia [ezetimibe]  Family History  Problem Relation Age of Onset  . COPD Father   . Heart disease Father   . COPD Sister   . Hyperlipidemia Other   . Alzheimer's disease Mother   . Colon cancer Maternal Aunt        60's  . Stomach cancer Maternal Aunt     Social History Social History   Tobacco Use  .  Smoking status: Never Smoker  . Smokeless tobacco: Never Used  Substance Use Topics  . Alcohol use: Yes    Comment: wine occ. every week per pt  . Drug use: No    Review of Systems  Constitutional: No fever/chills Eyes: No visual changes. ENT: No sore throat. Cardiovascular: Denies chest pain. Respiratory: Denies shortness of breath. Gastrointestinal: No abdominal pain.  No nausea, no vomiting.  No diarrhea.  No constipation. Genitourinary: Negative for dysuria. Musculoskeletal: Right knee pain.. Skin: Negative for rash.  Laceration right knee. Neurological: Negative for headaches, focal weakness or numbness. Endocrine:   Hyperlipidemia and hypertension. Allergic/Immunilogical: See extensive allergy list. ____________________________________________   PHYSICAL EXAM:  VITAL SIGNS: ED Triage Vitals  Enc Vitals Group     BP 07/02/19 0930 (!) 151/89     Pulse Rate 07/02/19 0930 85     Resp 07/02/19 0930 16     Temp 07/02/19 0930 98.2 F (36.8 C)     Temp Source 07/02/19 0930 Oral     SpO2 07/02/19 0930 98 %     Weight 07/02/19 0928 132 lb (59.9 kg)     Height 07/02/19 0928 5\' 1"  (1.549 m)     Head Circumference --      Peak Flow --      Pain Score 07/02/19 0927 3     Pain Loc --      Pain Edu? --      Excl. in [place]? --     Constitutional: Alert and oriented. Well appearing and in no acute distress. Cardiovascular: Normal rate, regular rhythm. Grossly normal heart sounds.  Good peripheral circulation.  Evaded blood pressure. Respiratory: Normal respiratory effort.  No retractions. Lungs CTAB. Musculoskeletal: No obvious deformity of the right knee.  Patient is moderate guarding palpation anterior knee.  Neurologic:  Normal speech and language. No gross focal neurologic deficits are appreciated. No gait instability. Skin: 2.5 cm laceration right knee. Psychiatric: Mood and affect are normal. Speech and behavior are normal.  ____________________________________________   LABS (all labs ordered are listed, but only abnormal results are displayed)  Labs Reviewed - No data to display ____________________________________________  EKG   ____________________________________________  RADIOLOGY  ED MD interpretation:    Official radiology report(s): No results found.  ____________________________________________   PROCEDURES  Procedure(s) performed (including Critical Care):  Marland KitchenMarland KitchenLaceration Repair  Date/Time: 07/02/2019 10:36 AM Performed by: Sable Feil, PA-C Authorized by: Sable Feil, PA-C   Consent:    Consent obtained:  Verbal   Consent given by:  Patient   Risks  discussed:  Infection, pain and poor cosmetic result Anesthesia (see MAR for exact dosages):    Anesthesia method:  Local infiltration   Local anesthetic:  Lidocaine 1% w/o epi Laceration details:    Location:  Leg   Leg location:  R knee   Length (cm):  2.5   Depth (mm):  2 Repair type:    Repair type:  Simple Pre-procedure details:    Preparation:  Patient was prepped and draped in usual sterile fashion Exploration:    Contaminated: no   Treatment:    Area cleansed with:  Betadine and saline   Amount of cleaning:  Standard   Irrigation method:  Pressure wash and syringe   Visualized foreign bodies/material removed: no   Skin repair:    Repair method:  Sutures   Suture size:  3-0   Suture material:  Nylon   Suture technique:  Simple interrupted   Number of sutures:  8 Approximation:    Approximation:  Close Post-procedure details:    Dressing:  Antibiotic ointment and sterile dressing   Patient tolerance of procedure:  Tolerated well, no immediate complications     ____________________________________________   INITIAL IMPRESSION / ASSESSMENT AND PLAN / ED COURSE  As part of my medical decision making, I reviewed the following data within the [place] [facility] [place] was evaluated in Emergency Department on 07/02/2019 for the symptoms described in the history of present illness. She was evaluated in the context of the global COVID-19 pandemic, which necessitated consideration that the patient might be at risk for infection with the SARS-CoV-2 virus that causes COVID-19. Institutional protocols and algorithms that pertain to the evaluation of patients at risk for COVID-19 are in a state of rapid change based on information released by regulatory bodies including the CDC and federal and state organizations. These policies and algorithms were followed during the patient's care in the ED.   Patient presents with laceration to right knee.  See  procedure note for wound closure.  Patient given discharge care instruction.  Patient states she only will take Tylenol for pain.  Patient advised her sutures removed in 10 days.  Return right ED if wound reopens for healing is complete.      ____________________________________________   FINAL CLINICAL IMPRESSION(S) / ED DIAGNOSES  Final diagnoses:  Knee laceration, right, initial encounter     ED Discharge Orders    None       Note:  This document was prepared using Dragon voice recognition software and [place] include unintentional dictation errors.    Sable Feil, PA-C 07/02/19 1041    Blake Divine, MD 07/02/19 [phone]

## 2019-07-02 NOTE — ED Notes (Signed)
See triage note  States she fell while doing her morning walking  States stepped on large piece of gravel  Twisted her ankle and fell on right knee    Laceration noted to knee

## 2019-07-02 NOTE — Discharge Instructions (Addendum)
Follow discharge care instruction has suture removal in 10 days.

## 2019-07-12 DIAGNOSIS — S81011A Laceration without foreign body, right knee, initial encounter: Secondary | ICD-10-CM | POA: Insufficient documentation

## 2019-07-12 NOTE — Patient Instructions (Addendum)
Your stitches were removed today.   Your knee looks good.  Continue applying antibacterial ointment and keeping it covered for a couple of more days.     Please call if there is no improvement in your symptoms.

## 2019-07-12 NOTE — Progress Notes (Signed)
Subjective:    Patient ID: Lisa [name], female    DOB: 03-01-1946, 73 y.o.   MRN: [id number]  HPI The patient is here for an acute visit for suture removal.   ED 07/02/19 at [facility] for knee laceration.  She slipped when she was walking on a gravel road and fell and cut her right knee.  There were no other injuries.  She sustained a 2.5 cm laceration of the right knee.  She had 8 stitches placed.  She is here today for removal of the sutures.  Her tetanus is up-to-date.   She has been applying antibacterial ointment and keeping the area covered.  She denies any pain.  There is no fever, chills, redness at the injury site, numbness/tingling or discharge/bleeding.       Medications and allergies reviewed with patient and updated if appropriate.  Patient Active Problem List   Diagnosis Date Noted  . Laceration of skin of right knee 07/12/2019  . Bruit of right carotid artery 07/16/2018  . Abdominal pain 01/06/2018  . Knee pain, right 12/21/2016  . Hip flexor tightness 11/30/2015  . Piriformis syndrome of left side 04/20/2014  . IT band syndrome 04/20/2014  . Frequent falls 04/20/2014  . Diabetes mellitus type 2 without retinopathy ([place]) 02/19/2013  . Well adult exam 07/13/2012  . Left lumbar radiculopathy 04/08/2012  . Macular hole 02/28/2012  . Status post intraocular lens implant 02/28/2012  . Vitamin D deficiency 04/30/2008  . Dyslipidemia 10/31/2007  . RECENT RETINAL DETACHMENT PARTIAL W/GIANT TEAR 10/31/2007  . Essential hypertension 10/31/2007    Current Outpatient Medications on File Prior to Visit  Medication Sig Dispense Refill  . cholecalciferol (VITAMIN D) 1000 UNITS tablet Take 2,000 Units by mouth daily.     . cyanocobalamin 1000 MCG tablet Take 1,000 mcg by mouth daily.    . Glucosamine HCl 500 MG TABS Take by mouth 2 (two) times daily.     Marland Kitchen ibuprofen (ADVIL,MOTRIN) 100 MG tablet Take 100 mg by mouth every 6 (six) hours as needed for pain.     Vanessa Kick Ethyl (VASCEPA) 1 g CAPS Take 2 capsules (2 g total) by mouth 2 (two) times daily. 360 capsule 3  . lisinopril-hydrochlorothiazide (ZESTORETIC) 10-12.5 MG tablet Take 1 tablet by mouth daily. 90 tablet 3  . PREVIDENT 5000 BOOSTER PLUS 1.1 % PSTE PLEASE SEE ATTACHED FOR DETAILED DIRECTIONS    . Turmeric 500 MG TABS Take 2 capsules by mouth 2 (two) times daily.    . vitamin C (ASCORBIC ACID) 500 MG tablet Take 500 mg by mouth daily as needed.     No current facility-administered medications on file prior to visit.     Past Medical History:  Diagnosis Date  . Arthritis   . Cataract 2009   right-Dr. Gershon Crane  . Hyperlipidemia   . Hypertension   . Post-operative nausea and vomiting   . Retinal tear 2008   B- Dr. Charolette Forward  . Vitamin D deficiency     Past Surgical History:  Procedure Laterality Date  . APPENDECTOMY  1963  . CATARACT EXTRACTION     Dr. Gershon Crane  . POLYPECTOMY    . RETINAL TEAR REPAIR CRYOTHERAPY     Dr. Charolette Forward    Social History   Socioeconomic History  . Marital status: Single    Spouse name: Not on file  . Number of children: Not on file  . Years of education: Not on file  . Highest education level:  Not on file  Occupational History  . Occupation: nutritionist  Social Needs  . Financial resource strain: Not on file  . Food insecurity    Worry: Not on file    Inability: Not on file  . Transportation needs    Medical: Not on file    Non-medical: Not on file  Tobacco Use  . Smoking status: Never Smoker  . Smokeless tobacco: Never Used  Substance and Sexual Activity  . Alcohol use: Yes    Comment: wine occ. every week per pt  . Drug use: No  . Sexual activity: Not Currently  Lifestyle  . Physical activity    Days per week: Not on file    Minutes per session: Not on file  . Stress: Not on file  Relationships  . Social Herbalist on phone: Not on file    Gets together: Not on file    Attends religious service: Not on file     Active member of club or organization: Not on file    Attends meetings of clubs or organizations: Not on file    Relationship status: Not on file  Other Topics Concern  . Not on file  Social History Narrative   Regular exercise- Yes    Family History  Problem Relation Age of Onset  . COPD Father   . Heart disease Father   . COPD Sister   . Hyperlipidemia Other   . Alzheimer's disease Mother   . Colon cancer Maternal Aunt        60's  . Stomach cancer Maternal Aunt     Review of Systems  Constitutional: Negative for chills and fever.  Cardiovascular: Negative for leg swelling.  Skin: Positive for wound. Negative for color change and rash.  Neurological: Negative for weakness and numbness.       Objective:   Vitals:   07/13/19 0829  BP: 130/74  Pulse: 76  Temp: 98.4 F (36.9 C)  SpO2: 97%   BP Readings from Last 3 Encounters:  07/13/19 130/74  07/02/19 (!) 151/89  03/05/19 130/78   Wt Readings from Last 3 Encounters:  07/13/19 132 lb 9.6 oz (60.1 kg)  07/02/19 132 lb (59.9 kg)  03/05/19 133 lb (60.3 kg)   Body mass index is 25.05 kg/m.   Physical Exam Constitutional:      General: She is not in acute distress.    Appearance: Normal appearance. She is not ill-appearing.  HENT:     Head: Normocephalic and atraumatic.  Musculoskeletal:     Comments: Normal range of motion of right knee  Skin:    General: Skin is warm and dry.     Comments: 2.5 cm laceration right distal anterior knee.  Wound is closed without active discharge or bleeding.  Slight crusting overlying laceration, no surrounding erythema, swelling.  No warmth.  Area is nontender to touch.  Neurological:     Mental Status: She is alert.            Assessment & Plan:    See Problem List for Assessment and Plan of chronic medical problems.

## 2019-07-13 ENCOUNTER — Ambulatory Visit (INDEPENDENT_AMBULATORY_CARE_PROVIDER_SITE_OTHER): Payer: Medicare Other | Admitting: Internal Medicine

## 2019-07-13 ENCOUNTER — Other Ambulatory Visit: Payer: Self-pay

## 2019-07-13 ENCOUNTER — Encounter: Payer: Self-pay | Admitting: Internal Medicine

## 2019-07-13 DIAGNOSIS — S81011A Laceration without foreign body, right knee, initial encounter: Secondary | ICD-10-CM

## 2019-07-13 NOTE — Assessment & Plan Note (Signed)
2.5 cm laceration right anterior knee-8 sutures removed Laceration is healing well there is no evidence of infection Continue antibiotic ointment and keep it covered for least a couple of days She will continue to monitor the area for infection She will call with any questions or concerns

## 2019-07-16 ENCOUNTER — Encounter: Payer: Self-pay | Admitting: Internal Medicine

## 2019-08-03 NOTE — Progress Notes (Addendum)
Subjective:   Lisa [name] is a 73 y.o. female who presents for Medicare Annual (Subsequent) preventive examination. I connected with patient by a telephone and verified that I am speaking with the correct person using two identifiers. Patient stated full name and DOB. Patient gave permission to continue with telephonic visit. Patient's location was at home and Nurse's location was at [place] office. Participants during this visit included patient and nurse.  Review of Systems:   Cardiac Risk Factors include: advanced age (>75men, >64 women);diabetes mellitus;hypertension;dyslipidemia Sleep patterns: feels rested on waking, gets up 0 times nightly to void and sleeps 7 hours nightly.    Home Safety/Smoke Alarms: Feels safe in home. Smoke alarms in place.  Living environment; residence and Firearm Safety: 2-story house. Lives alone, no needs for DME, good support system Seat Belt Safety/Bike Helmet: Wears seat belt.     Objective:     Vitals: There were no vitals taken for this visit.  There is no height or weight on file to calculate BMI.  Advanced Directives 08/04/2019 07/02/2019 06/25/2017  Does Patient Have a Medical Advance Directive? No No No  Would patient like information on creating a medical advance directive? No - Patient declined - Yes (ED - Information included in AVS)    Tobacco Social History   Tobacco Use  Smoking Status Never Smoker  Smokeless Tobacco Never Used     Counseling given: Not Answered  Past Medical History:  Diagnosis Date  . Arthritis   . Cataract 2009   right-Dr. Gershon Crane  . Hyperlipidemia   . Hypertension   . Post-operative nausea and vomiting   . Retinal tear 2008   B- Dr. Charolette Forward  . Vitamin D deficiency    Past Surgical History:  Procedure Laterality Date  . APPENDECTOMY  1963  . CATARACT EXTRACTION     Dr. Gershon Crane  . POLYPECTOMY    . RETINAL TEAR REPAIR CRYOTHERAPY     Dr. Charolette Forward   Family History  Problem Relation Age of  Onset  . COPD Father   . Heart disease Father   . COPD Sister   . Hyperlipidemia Other   . Alzheimer's disease Mother   . Colon cancer Maternal Aunt        60's  . Stomach cancer Maternal Aunt    Social History   Socioeconomic History  . Marital status: Single    Spouse name: Not on file  . Number of children: Not on file  . Years of education: Not on file  . Highest education level: Not on file  Occupational History  . Occupation: nutritionist [employer]  Social Needs  . Financial resource strain: Not hard at all  . Food insecurity    Worry: Never true    Inability: Never true  . Transportation needs    Medical: No    Non-medical: No  Tobacco Use  . Smoking status: Never Smoker  . Smokeless tobacco: Never Used  Substance and Sexual Activity  . Alcohol use: Yes    Comment:  occ. every week per pt  . Drug use: No  . Sexual activity: Not Currently  Lifestyle  . Physical activity    Days per week: 3 days    Minutes per session: 40 min  . Stress: Not at all  Relationships  . Social connections    Talks on phone: More than three times a week    Gets together: More than three times a week    Attends religious service:  More than 4 times per year    Active member of club or organization: Yes    Attends meetings of clubs or organizations: More than 4 times per year    Relationship status: Not on file  Other Topics Concern  . Not on file  Social History Narrative   Regular exercise- Yes    Outpatient Encounter Medications as of 08/04/2019  Medication Sig  . cholecalciferol (VITAMIN D) 1000 UNITS tablet Take 2,000 Units by mouth daily.   . cyanocobalamin 1000 MCG tablet Take 1,000 mcg by mouth daily.   . Glucosamine HCl 500 MG TABS Take by mouth 2 (two) times daily.   Marland Kitchen ibuprofen (ADVIL,MOTRIN) 100 MG tablet Take 100 mg by mouth every 6 (six) hours as needed for pain.  Vanessa Kick Ethyl (VASCEPA) 1 g CAPS Take 2 capsules (2 g total) by mouth 2 (two) times  daily.  Marland Kitchen lisinopril-hydrochlorothiazide (ZESTORETIC) 10-12.5 MG tablet Take 1 tablet by mouth daily.  Marland Kitchen PREVIDENT 5000 BOOSTER PLUS 1.1 % PSTE PLEASE SEE ATTACHED FOR DETAILED DIRECTIONS  . Turmeric 500 MG TABS Take 2 capsules by mouth 2 (two) times daily.  . vitamin C (ASCORBIC ACID) 500 MG tablet Take 500 mg by mouth daily as needed.   No facility-administered encounter medications on file as of 08/04/2019.     Activities of Daily Living In your present state of health, do you have any difficulty performing the following activities: 08/04/2019  Hearing? N  Vision? N  Difficulty concentrating or making decisions? N  Walking or climbing stairs? N  Dressing or bathing? N  Doing errands, shopping? N  Preparing Food and eating ? N  Using the Toilet? N  In the past six months, have you accidently leaked urine? N  Do you have problems with loss of bowel control? N  Managing your Medications? N  Managing your Finances? N  Housekeeping or managing your Housekeeping? N  Some recent data might be hidden    Patient Care Team: [name], [name], MD as PCP - General Newman Pies, MD (Neurosurgery) Dyke Maes, [place] as Referring Physician (Optometry) Weston Brass, MD (Gynecology) Irene Shipper, MD as Consulting Physician (Gastroenterology)    Assessment:   This is a routine wellness examination for [facility] Physical assessment deferred to PCP.   Exercise Activities and Dietary recommendations Current Exercise Habits: Home exercise routine, Type of exercise: walking;stretching;calisthenics, Time (Minutes): 40, Frequency (Times/Week): 4, Weekly Exercise (Minutes/Week): 160, Intensity: Mild, Exercise limited by: orthopedic condition(s)  Goals    . Maintain current health     Be as healthy and as independent as possible, increase muscle strength, perhaps work with a trainer to increase muscle strength or join [employer].     . Patient Stated     Continue to exercise, eat  healthy and keep my mind active. Stay as healthy and as independent as possible.       Fall Risk Fall Risk  08/04/2019 06/25/2017 12/07/2015 11/02/2014  Falls in the past year? 1 No No Yes  Number falls in past yr: 0 - - 2 or more  Injury with Fall? 1 - - Yes  Risk for fall due to : - - - Other (Comment);Impaired balance/gait  Follow up Falls prevention discussed - - -   Is the patient's home free of loose throw rugs in walkways, pet beds, electrical cords, etc?   yes      Grab bars in the bathroom? yes      Handrails on the  stairs?   yes      Adequate lighting?   yes  Depression Screen PHQ 2/9 Scores 08/04/2019 06/25/2017 12/07/2015 11/02/2014  PHQ - 2 Score 0 0 0 0     Cognitive Function MMSE - Mini Mental State Exam 06/25/2017  Orientation to time 5  Orientation to Place 5  Registration 3  Attention/ Calculation 5  Recall 3  Language- name 2 objects 2  Language- repeat 1  Language- follow 3 step command 3  Language- read & follow direction 1  Write a sentence 1  Copy design 1  Total score 30       Ad8 score reviewed for issues:  Issues making decisions: no  Less interest in hobbies / activities: no  Repeats questions, stories (family complaining): no  Trouble using ordinary gadgets (microwave, computer, phone):no  Forgets the month or year: no  Mismanaging finances: no  Remembering appts: no  Daily problems with thinking and/or memory: no Ad8 score is= 0  Immunization History  Administered Date(s) Administered  . Hepatitis A, Adult 06/02/2018  . Influenza Split 07/11/2012  . Influenza, High Dose Seasonal PF 06/20/2017, 06/20/2018, 05/28/2019  . Influenza,inj,Quad PF,6+ Mos 05/28/2016  . Influenza-Unspecified 10/14/2013, 06/17/2014, 06/18/2015  . Pneumococcal Conjugate-13 09/17/2008  . Pneumococcal Polysaccharide-23 11/02/2014  . Td 10/14/2013  . Zoster 03/10/2010   Screening Tests Health Maintenance  Topic Date Due  . FOOT EXAM  01/30/1956  .  HEMOGLOBIN A1C  09/02/2019  . OPHTHALMOLOGY EXAM  09/05/2019  . MAMMOGRAM  07/28/2020  . TETANUS/TDAP  10/15/2023  . INFLUENZA VACCINE  Completed  . DEXA SCAN  Completed  . Hepatitis C Screening  Completed  . PNA vac Low Risk Adult  Completed      Plan:    Reviewed health maintenance screenings with patient today and relevant education, vaccines, and/or referrals were provided.   Continue to eat heart healthy diet (full of fruits, vegetables, whole grains, lean protein, water--limit salt, fat, and sugar intake) and increase physical activity as tolerated.  Continue doing brain stimulating activities (puzzles, reading, adult coloring books, staying active) to keep memory sharp.   I have personally reviewed and noted the following in the patient's chart:   . Medical and social history . Use of alcohol, tobacco or illicit drugs  . Current medications and supplements . Functional ability and status . Nutritional status . Physical activity . Advanced directives . List of other physicians . Screenings to include cognitive, depression, and falls . Referrals and appointments  In addition, I have reviewed and discussed with patient certain preventive protocols, quality metrics, and best practice recommendations. A written personalized care plan for preventive services as well as general preventive health recommendations were provided to patient.     Michiel Cowboy, RN  08/04/2019   Medical screening examination/treatment/procedure(s) were performed by non-physician practitioner and as supervising physician I was immediately available for consultation/collaboration. I agree with above. Lew Dawes, MD

## 2019-08-04 ENCOUNTER — Ambulatory Visit (INDEPENDENT_AMBULATORY_CARE_PROVIDER_SITE_OTHER): Payer: Medicare Other | Admitting: *Deleted

## 2019-08-04 DIAGNOSIS — Z0000 Encounter for general adult medical examination without abnormal findings: Secondary | ICD-10-CM | POA: Diagnosis not present

## 2019-08-24 ENCOUNTER — Ambulatory Visit (INDEPENDENT_AMBULATORY_CARE_PROVIDER_SITE_OTHER): Payer: Medicare Other | Admitting: Internal Medicine

## 2019-08-24 ENCOUNTER — Other Ambulatory Visit (INDEPENDENT_AMBULATORY_CARE_PROVIDER_SITE_OTHER): Payer: Medicare Other

## 2019-08-24 ENCOUNTER — Encounter: Payer: Self-pay | Admitting: Internal Medicine

## 2019-08-24 ENCOUNTER — Other Ambulatory Visit: Payer: Self-pay

## 2019-08-24 DIAGNOSIS — R300 Dysuria: Secondary | ICD-10-CM

## 2019-08-24 DIAGNOSIS — M545 Low back pain, unspecified: Secondary | ICD-10-CM

## 2019-08-24 LAB — URINALYSIS, ROUTINE W REFLEX MICROSCOPIC
Bilirubin Urine: NEGATIVE
Ketones, ur: NEGATIVE
Nitrite: NEGATIVE
Specific Gravity, Urine: 1.01 (ref 1.000–1.030)
Total Protein, Urine: NEGATIVE
Urine Glucose: NEGATIVE
Urobilinogen, UA: 0.2 (ref 0.0–1.0)
pH: 6 (ref 5.0–8.0)

## 2019-08-24 MED ORDER — CEFUROXIME AXETIL 250 MG PO TABS: 250.0000 mg | ORAL_TABLET | Freq: Two times a day (BID) | ORAL | 0 refills | Status: DC

## 2019-08-24 MED ORDER — FLUCONAZOLE 150 MG PO TABS: 150.0000 mg | ORAL_TABLET | Freq: Once | ORAL | 1 refills | Status: AC

## 2019-08-24 NOTE — Assessment & Plan Note (Addendum)
UA Ceftin, Diflucan

## 2019-08-24 NOTE — Progress Notes (Signed)
Subjective:  Patient ID: Lisa [name], female    DOB: 10/15/1945  Age: 73 y.o. MRN: [id number]  CC: No chief complaint on file.   HPI Lisa [name] presents for dysuria <1 week, some LBP  Outpatient Medications Prior to Visit  Medication Sig Dispense Refill  . cholecalciferol (VITAMIN D) 1000 UNITS tablet Take 2,000 Units by mouth daily.     . cyanocobalamin 1000 MCG tablet Take 1,000 mcg by mouth daily.     . Glucosamine HCl 500 MG TABS Take by mouth 2 (two) times daily.     Marland Kitchen ibuprofen (ADVIL,MOTRIN) 100 MG tablet Take 100 mg by mouth every 6 (six) hours as needed for pain.    Vanessa Kick Ethyl (VASCEPA) 1 g CAPS Take 2 capsules (2 g total) by mouth 2 (two) times daily. 360 capsule 3  . lisinopril-hydrochlorothiazide (ZESTORETIC) 10-12.5 MG tablet Take 1 tablet by mouth daily. 90 tablet 3  . PREVIDENT 5000 BOOSTER PLUS 1.1 % PSTE PLEASE SEE ATTACHED FOR DETAILED DIRECTIONS    . Turmeric 500 MG TABS Take 2 capsules by mouth 2 (two) times daily.    . vitamin C (ASCORBIC ACID) 500 MG tablet Take 500 mg by mouth daily as needed.     No facility-administered medications prior to visit.     ROS: Review of Systems  Genitourinary: Positive for dysuria.  Musculoskeletal: Positive for back pain.    Objective:  BP 124/76 (BP Location: Left Arm, Patient Position: Sitting, Cuff Size: Normal)   Pulse 94   Temp 98.2 F (36.8 C) (Oral)   Ht 5\' 1"  (1.549 m)   Wt 130 lb (59 kg)   SpO2 98%   BMI 24.56 kg/m   BP Readings from Last 3 Encounters:  08/24/19 124/76  07/13/19 130/74  07/02/19 (!) 151/89    Wt Readings from Last 3 Encounters:  08/24/19 130 lb (59 kg)  07/13/19 132 lb 9.6 oz (60.1 kg)  07/02/19 132 lb (59.9 kg)    Physical Exam Constitutional:      General: She is not in acute distress.    Appearance: She is well-developed.  HENT:     Head: Normocephalic.     Right Ear: External ear normal.     Left Ear: External ear normal.     Nose: Nose normal.  Eyes:    General:        Right eye: No discharge.        Left eye: No discharge.     Conjunctiva/sclera: Conjunctivae normal.     Pupils: Pupils are equal, round, and reactive to light.  Neck:     Musculoskeletal: Normal range of motion and neck supple.     Thyroid: No thyromegaly.     Vascular: No JVD.     Trachea: No tracheal deviation.  Cardiovascular:     Rate and Rhythm: Normal rate and regular rhythm.     Heart sounds: Normal heart sounds.  Pulmonary:     Effort: No respiratory distress.     Breath sounds: No stridor. No wheezing.  Abdominal:     General: Bowel sounds are normal. There is no distension.     Palpations: Abdomen is soft. There is no mass.     Tenderness: There is no abdominal tenderness. There is no guarding or rebound.  Musculoskeletal:        General: Tenderness present.  Lymphadenopathy:     Cervical: No cervical adenopathy.  Skin:    Findings: No erythema or rash.  Neurological:     Cranial Nerves: No cranial nerve deficit.     Motor: No abnormal muscle tone.     Coordination: Coordination normal.     Deep Tendon Reflexes: Reflexes normal.  Psychiatric:        Behavior: Behavior normal.        Thought Content: Thought content normal.        Judgment: Judgment normal.   LS spine sensitive B to percussion  Lab Results  Component Value Date   WBC 4.7 07/14/2018   HGB 14.4 07/14/2018   HCT 42.1 07/14/2018   PLT 312.0 07/14/2018   GLUCOSE 101 (H) 03/03/2019   CHOL 223 (H) 03/03/2019   TRIG 122.0 03/03/2019   HDL 51.30 03/03/2019   LDLDIRECT 170.0 06/26/2017   LDLCALC 147 (H) 03/03/2019   ALT 19 07/14/2018   AST 15 07/14/2018   NA 134 (L) 03/03/2019   K 4.7 03/03/2019   CL 99 03/03/2019   CREATININE 0.60 03/03/2019   BUN 12 03/03/2019   CO2 27 03/03/2019   TSH 0.65 07/14/2018   HGBA1C 6.1 03/03/2019    No results found.  Assessment & Plan:   There are no diagnoses linked to this encounter.   No orders of the defined types were placed in  this encounter.    Follow-up: No follow-ups on file.  Walker Kehr, MD

## 2019-08-25 ENCOUNTER — Other Ambulatory Visit: Payer: Self-pay | Admitting: Internal Medicine

## 2019-08-25 DIAGNOSIS — Z1231 Encounter for screening mammogram for malignant neoplasm of breast: Secondary | ICD-10-CM

## 2019-09-07 ENCOUNTER — Other Ambulatory Visit (INDEPENDENT_AMBULATORY_CARE_PROVIDER_SITE_OTHER): Payer: Medicare Other

## 2019-09-07 DIAGNOSIS — E785 Hyperlipidemia, unspecified: Secondary | ICD-10-CM | POA: Diagnosis not present

## 2019-09-07 DIAGNOSIS — R739 Hyperglycemia, unspecified: Secondary | ICD-10-CM | POA: Diagnosis not present

## 2019-09-07 DIAGNOSIS — I10 Essential (primary) hypertension: Secondary | ICD-10-CM | POA: Diagnosis not present

## 2019-09-07 LAB — LIPID PANEL
Cholesterol: 201 mg/dL — ABNORMAL HIGH (ref 0–200)
HDL: 43.2 mg/dL (ref >39.00)
LDL Cholesterol: 123 mg/dL — ABNORMAL HIGH (ref 0–99)
NonHDL: 157.49
Total CHOL/HDL Ratio: 5
Triglycerides: 171 mg/dL — ABNORMAL HIGH (ref 0.0–149.0)
VLDL: 34.2 mg/dL (ref 0.0–40.0)

## 2019-09-07 LAB — BASIC METABOLIC PANEL
BUN: 15 mg/dL (ref 6–23)
CO2: 28 mEq/L (ref 19–32)
Calcium: 9.5 mg/dL (ref 8.4–10.5)
Chloride: 99 mEq/L (ref 96–112)
Creatinine, Ser: 0.64 mg/dL (ref 0.40–1.20)
GFR: 90.81 mL/min (ref >60.00)
Glucose, Bld: 106 mg/dL — ABNORMAL HIGH (ref 70–99)
Potassium: 4.8 mEq/L (ref 3.5–5.1)
Sodium: 136 mEq/L (ref 135–145)

## 2019-09-07 LAB — HEMOGLOBIN A1C: Hgb A1c MFr Bld: 6.2 % (ref 4.6–6.5)

## 2019-09-08 ENCOUNTER — Ambulatory Visit (INDEPENDENT_AMBULATORY_CARE_PROVIDER_SITE_OTHER): Payer: Medicare Other | Admitting: Internal Medicine

## 2019-09-08 ENCOUNTER — Encounter: Payer: Self-pay | Admitting: Internal Medicine

## 2019-09-08 ENCOUNTER — Other Ambulatory Visit: Payer: Self-pay

## 2019-09-08 DIAGNOSIS — E559 Vitamin D deficiency, unspecified: Secondary | ICD-10-CM

## 2019-09-08 DIAGNOSIS — I10 Essential (primary) hypertension: Secondary | ICD-10-CM | POA: Diagnosis not present

## 2019-09-08 DIAGNOSIS — E119 Type 2 diabetes mellitus without complications: Secondary | ICD-10-CM

## 2019-09-08 DIAGNOSIS — E785 Hyperlipidemia, unspecified: Secondary | ICD-10-CM | POA: Diagnosis not present

## 2019-09-08 MED ORDER — ICOSAPENT ETHYL 1 G PO CAPS: 2.0000 g | ORAL_CAPSULE | Freq: Two times a day (BID) | ORAL | 3 refills | Status: DC

## 2019-09-08 MED ORDER — LISINOPRIL-HYDROCHLOROTHIAZIDE 10-12.5 MG PO TABS: 1.0000 | ORAL_TABLET | Freq: Every day | ORAL | 3 refills | Status: DC

## 2019-09-08 NOTE — Assessment & Plan Note (Signed)
Vit D 

## 2019-09-08 NOTE — Assessment & Plan Note (Signed)
  On diet  

## 2019-09-08 NOTE — Progress Notes (Signed)
Subjective:  Patient ID: Lisa [name], female    DOB: [birth year]  Age: 73 y.o. MRN: [id number]  CC: No chief complaint on file.   HPI Lisa [name] presents for hyperglycemia, dyslipidemia, recent UTI f/u  Outpatient Medications Prior to Visit  Medication Sig Dispense Refill  . cefUROXime (CEFTIN) 250 MG tablet Take 1 tablet (250 mg total) by mouth 2 (two) times daily. 14 tablet 0  . cholecalciferol (VITAMIN D) 1000 UNITS tablet Take 2,000 Units by mouth daily.     . cyanocobalamin 1000 MCG tablet Take 1,000 mcg by mouth daily.     . Glucosamine HCl 500 MG TABS Take by mouth 2 (two) times daily.     Marland Kitchen ibuprofen (ADVIL,MOTRIN) 100 MG tablet Take 100 mg by mouth every 6 (six) hours as needed for pain.    Vanessa Kick Ethyl (VASCEPA) 1 g CAPS Take 2 capsules (2 g total) by mouth 2 (two) times daily. 360 capsule 3  . lisinopril-hydrochlorothiazide (ZESTORETIC) 10-12.5 MG tablet Take 1 tablet by mouth daily. 90 tablet 3  . PREVIDENT 5000 BOOSTER PLUS 1.1 % PSTE PLEASE SEE ATTACHED FOR DETAILED DIRECTIONS    . Turmeric 500 MG TABS Take 2 capsules by mouth 2 (two) times daily.    . vitamin C (ASCORBIC ACID) 500 MG tablet Take 500 mg by mouth daily as needed.     No facility-administered medications prior to visit.    ROS: Review of Systems  Constitutional: Negative for activity change, appetite change, chills, fatigue and unexpected weight change.  HENT: Negative for congestion, mouth sores and sinus pressure.   Eyes: Negative for visual disturbance.  Respiratory: Negative for cough and chest tightness.   Gastrointestinal: Negative for abdominal pain and nausea.  Genitourinary: Negative for difficulty urinating, frequency and vaginal pain.  Musculoskeletal: Negative for back pain and gait problem.  Skin: Negative for pallor and rash.  Neurological: Negative for dizziness, tremors, weakness, numbness and headaches.  Psychiatric/Behavioral: Negative for confusion and sleep disturbance.     Objective:  BP 126/78 (BP Location: Left Arm, Patient Position: Sitting, Cuff Size: Normal)   Pulse 78   Temp 98.2 F (36.8 C) (Oral)   Ht 5\' 1"  (1.549 m)   Wt 129 lb (58.5 kg)   SpO2 96%   BMI 24.37 kg/m   BP Readings from Last 3 Encounters:  09/08/19 126/78  08/24/19 124/76  07/13/19 130/74    Wt Readings from Last 3 Encounters:  09/08/19 129 lb (58.5 kg)  08/24/19 130 lb (59 kg)  07/13/19 132 lb 9.6 oz (60.1 kg)    Physical Exam Constitutional:      General: She is not in acute distress.    Appearance: She is well-developed.  HENT:     Head: Normocephalic.     Right Ear: External ear normal.     Left Ear: External ear normal.     Nose: Nose normal.  Eyes:     General:        Right eye: No discharge.        Left eye: No discharge.     Conjunctiva/sclera: Conjunctivae normal.     Pupils: Pupils are equal, round, and reactive to light.  Neck:     Thyroid: No thyromegaly.     Vascular: No JVD.     Trachea: No tracheal deviation.  Cardiovascular:     Rate and Rhythm: Normal rate and regular rhythm.     Heart sounds: Normal heart sounds.  Pulmonary:  Effort: No respiratory distress.     Breath sounds: No stridor. No wheezing.  Abdominal:     General: Bowel sounds are normal. There is no distension.     Palpations: Abdomen is soft. There is no mass.     Tenderness: There is no abdominal tenderness. There is no guarding or rebound.  Musculoskeletal:        General: No tenderness.     Cervical back: Normal range of motion and neck supple.  Lymphadenopathy:     Cervical: No cervical adenopathy.  Skin:    Findings: No erythema or rash.  Neurological:     Cranial Nerves: No cranial nerve deficit.     Motor: No abnormal muscle tone.     Coordination: Coordination normal.     Deep Tendon Reflexes: Reflexes normal.  Psychiatric:        Behavior: Behavior normal.        Thought Content: Thought content normal.        Judgment: Judgment normal.      Lab Results  Component Value Date   WBC 4.7 07/14/2018   HGB 14.4 07/14/2018   HCT 42.1 07/14/2018   PLT 312.0 07/14/2018   GLUCOSE 106 (H) 09/07/2019   CHOL 201 (H) 09/07/2019   TRIG 171.0 (H) 09/07/2019   HDL 43.20 09/07/2019   LDLDIRECT 170.0 06/26/2017   LDLCALC 123 (H) 09/07/2019   ALT 19 07/14/2018   AST 15 07/14/2018   NA 136 09/07/2019   K 4.8 09/07/2019   CL 99 09/07/2019   CREATININE 0.64 09/07/2019   BUN 15 09/07/2019   CO2 28 09/07/2019   TSH 0.65 07/14/2018   HGBA1C 6.2 09/07/2019    No results found.  Assessment & Plan:   There are no diagnoses linked to this encounter.   No orders of the defined types were placed in this encounter.    Follow-up: No follow-ups on file.  Walker Kehr, MD

## 2019-09-08 NOTE — Patient Instructions (Signed)
Cardiac CT calcium scoring test $150 Tel # is [phone]   The extent of CAD is graded according to your calcium score:  Calcium Score  Presence of CAD (coronary artery disease)  0 No evidence of CAD   1-10 Minimal evidence of CAD  11-100 Mild evidence of CAD  101-400 Moderate evidence of CAD  Over 400 Extensive evidence of CAD

## 2019-09-08 NOTE — Assessment & Plan Note (Signed)
Discussed.

## 2019-09-08 NOTE — Assessment & Plan Note (Signed)
Prinizide 

## 2019-09-15 ENCOUNTER — Encounter: Payer: Self-pay | Admitting: Internal Medicine

## 2019-09-23 ENCOUNTER — Encounter: Payer: Self-pay | Admitting: Internal Medicine

## 2019-09-24 DIAGNOSIS — H35343 Macular cyst, hole, or pseudohole, bilateral: Secondary | ICD-10-CM | POA: Diagnosis not present

## 2019-09-24 DIAGNOSIS — E119 Type 2 diabetes mellitus without complications: Secondary | ICD-10-CM | POA: Diagnosis not present

## 2019-09-24 DIAGNOSIS — H26492 Other secondary cataract, left eye: Secondary | ICD-10-CM | POA: Diagnosis not present

## 2019-09-24 DIAGNOSIS — Z961 Presence of intraocular lens: Secondary | ICD-10-CM | POA: Diagnosis not present

## 2019-09-25 ENCOUNTER — Inpatient Hospital Stay: Admission: RE | Admit: 2019-09-25 | Payer: Medicare Other | Source: Ambulatory Visit

## 2019-10-26 DIAGNOSIS — H00022 Hordeolum internum right lower eyelid: Secondary | ICD-10-CM | POA: Diagnosis not present

## 2019-11-03 ENCOUNTER — Other Ambulatory Visit: Payer: Self-pay

## 2019-11-03 ENCOUNTER — Inpatient Hospital Stay: Admission: RE | Admit: 2019-11-03 | Payer: Medicare PPO | Source: Ambulatory Visit

## 2019-11-03 ENCOUNTER — Ambulatory Visit (INDEPENDENT_AMBULATORY_CARE_PROVIDER_SITE_OTHER)
Admission: RE | Admit: 2019-11-03 | Discharge: 2019-11-03 | Disposition: A | Discharge status: Home / Self Care | Payer: Self-pay | Source: Ambulatory Visit | Attending: Internal Medicine | Admitting: Internal Medicine

## 2019-11-03 DIAGNOSIS — E119 Type 2 diabetes mellitus without complications: Secondary | ICD-10-CM

## 2019-11-03 DIAGNOSIS — E785 Hyperlipidemia, unspecified: Secondary | ICD-10-CM

## 2019-11-03 DIAGNOSIS — K449 Diaphragmatic hernia without obstruction or gangrene: Secondary | ICD-10-CM | POA: Diagnosis not present

## 2019-11-13 ENCOUNTER — Other Ambulatory Visit: Payer: Self-pay | Admitting: Internal Medicine

## 2019-11-13 ENCOUNTER — Ambulatory Visit
Admission: RE | Admit: 2019-11-13 | Discharge: 2019-11-13 | Disposition: A | Discharge status: Home / Self Care | Payer: Medicare PPO | Source: Ambulatory Visit | Attending: Internal Medicine | Admitting: Internal Medicine

## 2019-11-13 ENCOUNTER — Other Ambulatory Visit: Payer: Self-pay

## 2019-11-13 DIAGNOSIS — Z1231 Encounter for screening mammogram for malignant neoplasm of breast: Secondary | ICD-10-CM

## 2020-03-07 ENCOUNTER — Other Ambulatory Visit (INDEPENDENT_AMBULATORY_CARE_PROVIDER_SITE_OTHER): Payer: Medicare PPO

## 2020-03-07 DIAGNOSIS — I10 Essential (primary) hypertension: Secondary | ICD-10-CM | POA: Diagnosis not present

## 2020-03-07 DIAGNOSIS — R739 Hyperglycemia, unspecified: Secondary | ICD-10-CM | POA: Diagnosis not present

## 2020-03-07 DIAGNOSIS — E785 Hyperlipidemia, unspecified: Secondary | ICD-10-CM

## 2020-03-07 LAB — BASIC METABOLIC PANEL
BUN: 15 mg/dL (ref 6–23)
CO2: 27 mEq/L (ref 19–32)
Calcium: 9.3 mg/dL (ref 8.4–10.5)
Chloride: 100 mEq/L (ref 96–112)
Creatinine, Ser: 0.63 mg/dL (ref 0.40–1.20)
GFR: 92.35 mL/min (ref >60.00)
Glucose, Bld: 107 mg/dL — ABNORMAL HIGH (ref 70–99)
Potassium: 3.7 mEq/L (ref 3.5–5.1)
Sodium: 135 mEq/L (ref 135–145)

## 2020-03-07 LAB — LIPID PANEL
Cholesterol: 238 mg/dL — ABNORMAL HIGH (ref 0–200)
HDL: 48.4 mg/dL (ref >39.00)
LDL Cholesterol: 154 mg/dL — ABNORMAL HIGH (ref 0–99)
NonHDL: 190.08
Total CHOL/HDL Ratio: 5
Triglycerides: 181 mg/dL — ABNORMAL HIGH (ref 0.0–149.0)
VLDL: 36.2 mg/dL (ref 0.0–40.0)

## 2020-03-07 LAB — HEMOGLOBIN A1C: Hgb A1c MFr Bld: 5.9 % (ref 4.6–6.5)

## 2020-03-08 ENCOUNTER — Ambulatory Visit: Payer: Medicare PPO | Admitting: Internal Medicine

## 2020-03-08 ENCOUNTER — Encounter: Payer: Self-pay | Admitting: Internal Medicine

## 2020-03-08 ENCOUNTER — Other Ambulatory Visit: Payer: Self-pay

## 2020-03-08 DIAGNOSIS — E119 Type 2 diabetes mellitus without complications: Secondary | ICD-10-CM

## 2020-03-08 DIAGNOSIS — I10 Essential (primary) hypertension: Secondary | ICD-10-CM

## 2020-03-08 DIAGNOSIS — E785 Hyperlipidemia, unspecified: Secondary | ICD-10-CM | POA: Diagnosis not present

## 2020-03-08 DIAGNOSIS — E559 Vitamin D deficiency, unspecified: Secondary | ICD-10-CM

## 2020-03-08 MED ORDER — ICOSAPENT ETHYL 1 G PO CAPS: 2.0000 g | ORAL_CAPSULE | Freq: Two times a day (BID) | ORAL | 3 refills | Status: DC

## 2020-03-08 MED ORDER — LISINOPRIL-HYDROCHLOROTHIAZIDE 10-12.5 MG PO TABS: 1.0000 | ORAL_TABLET | Freq: Every day | ORAL | 3 refills | Status: DC

## 2020-03-08 NOTE — Assessment & Plan Note (Addendum)
CT coronary calcium score 0 Vascepa

## 2020-03-08 NOTE — Assessment & Plan Note (Signed)
Vit d 

## 2020-03-08 NOTE — Assessment & Plan Note (Signed)
Coronary calcium score of 0. 

## 2020-03-08 NOTE — Progress Notes (Signed)
Subjective:  Patient ID: Lisa [name], female    DOB: 06-Nov-1945  Age: 74 y.o. MRN: [id number]  CC: No chief complaint on file.   HPI Lisa [name] presents for HTN, dyslipidemia, elev glucose  Outpatient Medications Prior to Visit  Medication Sig Dispense Refill  . cholecalciferol (VITAMIN D) 1000 UNITS tablet Take 2,000 Units by mouth daily.     . cyanocobalamin 1000 MCG tablet Take 1,000 mcg by mouth daily.     . Glucosamine HCl 500 MG TABS Take by mouth 2 (two) times daily.     Marland Kitchen icosapent Ethyl (VASCEPA) 1 g capsule Take 2 capsules (2 g total) by mouth 2 (two) times daily. 360 capsule 3  . lisinopril-hydrochlorothiazide (ZESTORETIC) 10-12.5 MG tablet Take 1 tablet by mouth daily. 90 tablet 3  . PREVIDENT 5000 BOOSTER PLUS 1.1 % PSTE PLEASE SEE ATTACHED FOR DETAILED DIRECTIONS    . Turmeric 500 MG TABS Take 2 capsules by mouth 2 (two) times daily.    . vitamin C (ASCORBIC ACID) 500 MG tablet Take 500 mg by mouth daily as needed.    Marland Kitchen ibuprofen (ADVIL,MOTRIN) 100 MG tablet Take 100 mg by mouth every 6 (six) hours as needed for pain. (Patient not taking: Reported on 03/08/2020)    . cefUROXime (CEFTIN) 250 MG tablet Take 1 tablet (250 mg total) by mouth 2 (two) times daily. (Patient not taking: Reported on 03/08/2020) 14 tablet 0   No facility-administered medications prior to visit.    ROS: Review of Systems  Objective:  BP (!) 148/82 (BP Location: Left Arm, Patient Position: Sitting, Cuff Size: Normal)   Pulse 72   Temp 98.4 F (36.9 C) (Oral)   Ht 5\' 1"  (1.549 m)   Wt 127 lb (57.6 kg)   SpO2 97%   BMI 24.00 kg/m   BP Readings from Last 3 Encounters:  03/08/20 (!) 148/82  09/08/19 126/78  08/24/19 124/76    Wt Readings from Last 3 Encounters:  03/08/20 127 lb (57.6 kg)  09/08/19 129 lb (58.5 kg)  08/24/19 130 lb (59 kg)    Physical Exam  Lab Results  Component Value Date   WBC 4.7 07/14/2018   HGB 14.4 07/14/2018   HCT 42.1 07/14/2018   PLT 312.0  07/14/2018   GLUCOSE 107 (H) 03/07/2020   CHOL 238 (H) 03/07/2020   TRIG 181.0 (H) 03/07/2020   HDL 48.40 03/07/2020   LDLDIRECT 170.0 06/26/2017   LDLCALC 154 (H) 03/07/2020   ALT 19 07/14/2018   AST 15 07/14/2018   NA 135 03/07/2020   K 3.7 03/07/2020   CL 100 03/07/2020   CREATININE 0.63 03/07/2020   BUN 15 03/07/2020   CO2 27 03/07/2020   TSH 0.65 07/14/2018   HGBA1C 5.9 03/07/2020    MM 3D SCREEN BREAST BILATERAL  Result Date: 11/16/2019 CLINICAL DATA:  Screening. EXAM: DIGITAL SCREENING BILATERAL MAMMOGRAM WITH TOMO AND CAD COMPARISON:  Previous exam(s). ACR Breast Density Category c: The breast tissue is heterogeneously dense, which Goerke obscure small masses. FINDINGS: There are no findings suspicious for malignancy. Images were processed with CAD. IMPRESSION: No mammographic evidence of malignancy. A result letter of this screening mammogram will be mailed directly to the patient. RECOMMENDATION: Screening mammogram in one year. (Code:SM-B-01Y) BI-RADS CATEGORY  1: Negative. Electronically Signed   By: Kristopher Oppenheim M.D.   On: 11/16/2019 11:49    Assessment & Plan:   There are no diagnoses linked to this encounter.   No orders of  the defined types were placed in this encounter.    Follow-up: No follow-ups on file.  Walker Kehr, MD

## 2020-03-08 NOTE — Assessment & Plan Note (Signed)
Prinizide

## 2020-05-25 ENCOUNTER — Ambulatory Visit: Payer: Medicare PPO | Admitting: Dermatology

## 2020-05-25 ENCOUNTER — Other Ambulatory Visit: Payer: Self-pay

## 2020-05-25 DIAGNOSIS — Z1283 Encounter for screening for malignant neoplasm of skin: Secondary | ICD-10-CM | POA: Diagnosis not present

## 2020-05-25 DIAGNOSIS — L814 Other melanin hyperpigmentation: Secondary | ICD-10-CM | POA: Diagnosis not present

## 2020-05-25 DIAGNOSIS — L821 Other seborrheic keratosis: Secondary | ICD-10-CM | POA: Diagnosis not present

## 2020-05-25 NOTE — Patient Instructions (Addendum)
First visit in many years for Lisa [name] date of birth 1946-06-24.  The professional who does her pedicure was worried about a two-tone spot on her left shin.  In addition to this subtly textured 1 cm spot there are half a dozen other smaller spots on her legs and her upper arms that are called stucco keratoses. these are inherited benign thickenings of the top skin that do not require watching or removal; dermoscopy confirmatory.  On the dorsum of her hands are a few small solar lentigos which I encouraged her to consider to be "wisdom" spots. on the right outer back is a smooth under the skin bump which is likely a small neurofibroma and can be left if stable.  As healthy as Lisa [name]'s skin appears, she can choose to only see a skin specialist if there is a known change or I would be happy to do a general skin check every 1 to 2 years

## 2020-06-21 ENCOUNTER — Encounter: Payer: Self-pay | Admitting: Dermatology

## 2020-06-21 NOTE — Progress Notes (Signed)
° °  New Patient   Subjective  Lisa [name] is a 74 y.o. female who presents for the following: Skin Problem (Check spot on scalp. Pilar cyst getting larger. Also check spot in left shin brown spot. Patient says its new, bigger, darker. ).  Discolored spots Location: Legs Duration: Uncertain Quality:  Associated Signs/Symptoms: None Modifying Factors:  Severity:  Timing: Context:    The following portions of the chart were reviewed this encounter and updated as appropriate: Tobacco   Allergies   Meds   Problems   Med Hx   Surg Hx   Fam Hx       Objective  Well appearing patient in no apparent distress; mood and affect are within normal limits.  All sun exposed areas plus back examined. Plus legs.   Assessment & Plan  Seborrheic keratosis Left Lower Leg - Anterior  Leave if stable.  Skin exam for malignant neoplasm Mid Back  Self examine skin twice annually, dermatologist exam in 1 to 2-year intervals  First visit in many years for Lisa [name] [place] date of birth 1945-10-31.  The professional who does her pedicure was worried about a two-tone spot on her left shin.  In addition to this subtly textured 1 cm spot there are half a dozen other smaller spots on her legs and her upper arms that are called stucco keratoses. these are inherited benign thickenings of the top skin that do not require watching or removal; dermoscopy confirmatory.  On the dorsum of her hands are a few small solar lentigos which I encouraged her to consider to be "wisdom" spots. on the right outer back is a smooth under the skin bump which is likely a small neurofibroma and can be left if stable.  As healthy as Lisa [name]'s skin appears, she can choose to only see a skin specialist if there is a known change or I would be happy to do a general skin check every 1 to 2 years

## 2020-07-06 NOTE — Progress Notes (Signed)
[name] [place] Phone: [phone] Subjective:   [name], am serving as a scribe for Dr. Hulan Saas. This visit occurred during the SARS-CoV-2 public health emergency.  Safety protocols were in place, including screening questions prior to the visit, additional usage of staff PPE, and extensive cleaning of exam room while observing appropriate contact time as indicated for disinfecting solutions.   I'm seeing this patient by the request  of:  Lisa [name] Lacks, MD  CC: Hip pain  FTD:[id number]   11/30/2015 Patient is having more hip flexor tightness than previous exam. We discussed icing regimen, we discussed new exercises and patient given a handout. We discussed which activities would be better and which ones to avoid. We will continue to monitor and if any radicular symptoms occur again we will need to have patient be seen again. Otherwise patient will check in by email in 4 weeks.   Update 07/07/2020 Lisa [name] is a 74 y.o. female coming in with complaint of right hip pain. 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 Sit to stand causes shooting pain into right leg. Patient feels like her pain started in right SI joint. Has not been working out but would like to do [place] in November.       Past Medical History:  Diagnosis Date  . Arthritis   . Cataract 2009   right-Dr. Gershon Crane  . Hyperlipidemia   . Hypertension   . Post-operative nausea and vomiting   . Retinal tear 2008   B- Dr. Charolette Forward  . Vitamin D deficiency    Past Surgical History:  Procedure Laterality Date  . APPENDECTOMY  1963  . CATARACT EXTRACTION     Dr. Gershon Crane  . POLYPECTOMY    . RETINAL TEAR REPAIR CRYOTHERAPY     Dr. Charolette Forward   Social History   Socioeconomic History  . Marital status: Single    Spouse name: Not on file  . Number of children:  Not on file  . Years of education: Not on file  . Highest education level: Not on file  Occupational History  . 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 Smoking status: Never Smoker  . Smokeless tobacco: Never Used  Vaping Use  . Vaping Use: Never used  Substance and Sexual Activity  . Alcohol use: Yes    Comment: wine occ. every week per pt  . Drug use: No  . Sexual activity: Not Currently  Other Topics Concern  . Not on file  Social History Narrative   Regular exercise- Yes   Social Determinants of Health   Financial Resource Strain: Low Risk   . Difficulty of Paying Living Expenses: Not hard at all  Food Insecurity: No Food Insecurity  . Worried About Charity fundraiser in the Last Year: Never true  . Ran Out of Food in the Last Year: Never true  Transportation Needs: No Transportation Needs  . Lack of Transportation (Medical): No  . Lack of Transportation (Non-Medical): No  Physical Activity: Insufficiently Active  . Days of Exercise per Week: 3 days  . Minutes of Exercise per Session: 40 min  Stress: No Stress Concern Present  . Feeling of Stress : Not at all  Social Connections: Unknown  . Frequency of Communication with Friends and Family: More than three times a week  . Frequency of Social Gatherings  with Friends and Family: More than three times a week  . Attends Religious Services: More than 4 times per year  . Active Member of Clubs or Organizations: Yes  . Attends Archivist Meetings: More than 4 times per year  . Marital Status: Not on file   Allergies  Allergen Reactions  . Atorvastatin     REACTION: achy  . Ezetimibe-Simvastatin     REACTION: myalgia  . Fish Oil     REACTION: bruising  . Livalo [Pitavastatin Calcium]     pain  . Lovastatin     REACTION: myalgias  . Rosuvastatin     REACTION: myalgia  . Zetia [Ezetimibe]     Side effects   Family History  Problem Relation Age of Onset  . COPD Father   . Heart disease  Father   . COPD Sister   . Hyperlipidemia Other   . Alzheimer's disease Mother   . Colon cancer Maternal Aunt        60's  . Stomach cancer Maternal Aunt      Current Outpatient Medications (Cardiovascular):  .  icosapent Ethyl (VASCEPA) 1 g capsule, Take 2 capsules (2 g total) by mouth 2 (two) times daily. Marland Kitchen  lisinopril-hydrochlorothiazide (ZESTORETIC) 10-12.5 MG tablet, Take 1 tablet by mouth daily.   Current Outpatient Medications (Analgesics):  .  ibuprofen (ADVIL,MOTRIN) 100 MG tablet, Take 100 mg by mouth every 6 (six) hours as needed for pain.   Current Outpatient Medications (Hematological):  .  cyanocobalamin 1000 MCG tablet, Take 1,000 mcg by mouth daily.   Current Outpatient Medications (Other):  .  cholecalciferol (VITAMIN D) 1000 UNITS tablet, Take 2,000 Units by mouth daily.  .  Glucosamine HCl 500 MG TABS, Take by mouth 2 (two) times daily.  Marland Kitchen  PREVIDENT 5000 BOOSTER PLUS 1.1 % PSTE, PLEASE SEE ATTACHED FOR DETAILED DIRECTIONS .  Turmeric 500 MG TABS, Take 2 capsules by mouth 2 (two) times daily. .  vitamin C (ASCORBIC ACID) 500 MG tablet, Take 500 mg by mouth daily as needed.   Reviewed prior external information including notes and imaging from  primary care provider As well as notes that were available from care everywhere and other healthcare systems.  Past medical history, social, surgical and family history all reviewed in electronic medical record.  No pertanent information unless stated regarding to the chief complaint.   Review of Systems:  No headache, visual changes, nausea, vomiting, diarrhea, constipation, dizziness, abdominal pain, skin rash, fevers, chills, night sweats, weight loss, swollen lymph nodes, body aches, joint swelling, chest pain, shortness of breath, mood changes. POSITIVE muscle aches  Objective  Blood pressure 134/72, pulse 73, height 5\' 1"  (1.549 m), weight 126 lb (57.2 kg), SpO2 98 %.   General: No apparent distress alert and  oriented x3 mood and affect normal, dressed appropriately.  HEENT: Pupils equal, extraocular movements intact  Respiratory: Patient's speak in full sentences and does not appear short of breath  Cardiovascular: No lower extremity edema, non tender, no erythema  Gait normal with good balance and coordination.  MSK: Mild arthritic changes of multiple joints Patient does have some tightness in the gluteal area on the right side as well as the piriformis with mild tenderness.  Negative straight leg test.  5-5 strength in lower extremities.  Right ankle exam does have some very mild pain over the lateral aspect but patient is able to move the ankle just fine with 5 out of 5 strength.  [id number]; 15  additional minutes spent for Therapeutic exercises as stated in above notes.  This included exercises focusing on stretching, strengthening, with significant focus on eccentric aspects.   Long term goals include an improvement in range of motion, strength, endurance as well as avoiding reinjury. Patient's frequency would include in 1-2 times a day, 3-5 times a week for a duration of 6-12 weeks. Using Netter's Orthopaedic Anatomy, reviewed with the patient the structures involved and how they related to diagnosis. The patient indicated understanding.   The patient was given a handout about classic piriformis stretching including [employer], Modified [employer], my self-described "Sink Stretch," and other piriformis rehab.  We also reviewed hip flexor and abductor strengthening, ham stretching  Rec deep massage, explained self-massage with ball  Proper technique shown and discussed handout in great detail with ATC.  All questions were discussed and answered.     Impression and Recommendations:     The above documentation has been reviewed and is accurate and complete Lyndal Pulley, DO

## 2020-07-07 ENCOUNTER — Other Ambulatory Visit: Payer: Self-pay

## 2020-07-07 ENCOUNTER — Encounter: Payer: Self-pay | Admitting: Family Medicine

## 2020-07-07 ENCOUNTER — Ambulatory Visit: Payer: Medicare PPO | Admitting: Family Medicine

## 2020-07-07 DIAGNOSIS — M7918 Myalgia, other site: Secondary | ICD-10-CM

## 2020-07-07 DIAGNOSIS — M25571 Pain in right ankle and joints of right foot: Secondary | ICD-10-CM | POA: Diagnosis not present

## 2020-07-07 DIAGNOSIS — G8929 Other chronic pain: Secondary | ICD-10-CM | POA: Diagnosis not present

## 2020-07-07 NOTE — Patient Instructions (Signed)
Great to see you  Exercises 3 times a week.  Ice 20 minutes 2 times daily. Usually after activity and before bed. Adidas boost, saucony or Newtons shoes would be great  [employer] orthotics "total support" online would be great if not the shoes.  Ankle exercises as well  You should do great for the race  See me again in 6 weeks

## 2020-07-07 NOTE — Assessment & Plan Note (Signed)
Patient did have a injury to the ankle approximately 1 year ago.  He does have some mild lateral column overload.  Given exercises for some stability and discussed proper shoes and orthotics that could be beneficial.

## 2020-07-07 NOTE — Assessment & Plan Note (Signed)
I believe the patient did have a muscle spasm.  Do not strain appropriately.  We discussed increasing vitamin D, home exercises and work with Product/process development scientist, we discussed different shoes that he think will be more helpful for patient as well.  Patient will increase activity slowly and follow-up with me again 6 weeks

## 2020-08-18 ENCOUNTER — Encounter: Payer: Self-pay | Admitting: Family Medicine

## 2020-08-18 ENCOUNTER — Other Ambulatory Visit: Payer: Self-pay

## 2020-08-18 ENCOUNTER — Ambulatory Visit: Payer: Medicare PPO | Admitting: Family Medicine

## 2020-08-18 DIAGNOSIS — M25571 Pain in right ankle and joints of right foot: Secondary | ICD-10-CM | POA: Diagnosis not present

## 2020-08-18 DIAGNOSIS — G8929 Other chronic pain: Secondary | ICD-10-CM | POA: Diagnosis not present

## 2020-08-18 DIAGNOSIS — M7918 Myalgia, other site: Secondary | ICD-10-CM | POA: Diagnosis not present

## 2020-08-18 NOTE — Assessment & Plan Note (Signed)
Lateral column overload with what appears to be more of a perineal cyst. Significant improvement noted.  Follow-up with me as needed

## 2020-08-18 NOTE — Assessment & Plan Note (Signed)
Patient is doing much better at this time.  Discussed with patient about icing regimen.  Patient will continue with the stretches.  Can follow-up with me as needed

## 2020-08-18 NOTE — Progress Notes (Signed)
[facility] [place] Phone: [phone] Subjective:   Lisa [name], am serving as a scribe for Dr. Hulan Saas. This visit occurred during the SARS-CoV-2 public health emergency.  Safety protocols were in place, including screening questions prior to the visit, additional usage of staff PPE, and extensive cleaning of exam room while observing appropriate contact time as indicated for disinfecting solutions.   I'm seeing this patient by the request  of:  [name], Lisa Lacks, MD  CC: Knee and ankle pain follow-up  [id number]   07/07/2020 Patient did have a injury to the ankle approximately 1 year ago.  He does have some mild lateral column overload.  Given exercises for some stability and discussed proper shoes and orthotics that could be beneficial.  I believe the patient did have a muscle spasm.  Do not strain appropriately.  We discussed increasing vitamin D, home exercises and work with Product/process development scientist, we discussed different shoes that he think will be more helpful for patient as well.  Patient will increase activity slowly and follow-up with me again 6 weeks  Update 08/18/2020 Lisa [name] is a 74 y.o. female coming in with complaint of right ankle and hip pain. Patient states that her hip pain has subsided. Has been doing stretches. Did [employer].  Patient states that overall she is having very minimal discomfort in the hip and just mild discomfort in the ankle.  Patient can do daily activities just fine.  Has been working again which she states has also given her a little more time to herself that has been helpful.  Also feels like ankle pain has improved. Has been staying off of unstable surfaces.     Past Medical History:  Diagnosis Date  . Arthritis   . Cataract 2009   right-Dr. Gershon Crane  . Hyperlipidemia   . Hypertension   . Post-operative nausea and vomiting   . Retinal tear 2008   B- Dr.  Charolette Forward  . Vitamin D deficiency    Past Surgical History:  Procedure Laterality Date  . APPENDECTOMY  1963  . CATARACT EXTRACTION     Dr. Gershon Crane  . POLYPECTOMY    . RETINAL TEAR REPAIR CRYOTHERAPY     Dr. Charolette Forward   Social History   Socioeconomic History  . Marital status: Single    Spouse name: Not on file  . Number of children: Not on file  . Years of education: Not on file  . Highest education level: Not on file  Occupational History  . 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 Smoking status: Never Smoker  . Smokeless tobacco: Never Used  Vaping Use  . Vaping Use: Never used  Substance and Sexual Activity  . Alcohol use: Yes    Comment: wine occ. every week per pt  . Drug use: [name]  . Sexual activity: Not Currently  Other Topics Concern  . Not on file  Social History Narrative   Regular exercise- Yes   Social Determinants of Health   Financial Resource Strain:   . Difficulty of Paying Living Expenses: Not on file  Food Insecurity:   . Worried About Charity fundraiser in the Last Year: Not on file  . Ran Out of Food in the Last Year: Not on file  Transportation Needs:   . Lack of Transportation (Medical): Not on file  . Lack of Transportation (Non-Medical): Not on file  Physical Activity:   .  Days of Exercise per Week: Not on file  . Minutes of Exercise per Session: Not on file  Stress:   . Feeling of Stress : Not on file  Social Connections:   . Frequency of Communication with Friends and Family: Not on file  . Frequency of Social Gatherings with Friends and Family: Not on file  . Attends Religious Services: Not on file  . Active Member of Clubs or Organizations: Not on file  . Attends Archivist Meetings: Not on file  . Marital Status: Not on file   Allergies  Allergen Reactions  . Atorvastatin     REACTION: achy  . Ezetimibe-Simvastatin     REACTION: myalgia  . Fish Oil     REACTION: bruising  . Livalo [Pitavastatin  Calcium]     pain  . Lovastatin     REACTION: myalgias  . Rosuvastatin     REACTION: myalgia  . Zetia [Ezetimibe]     Side effects   Family History  Problem Relation Age of Onset  . COPD Father   . Heart disease Father   . COPD Sister   . Hyperlipidemia Other   . Alzheimer's disease Mother   . Colon cancer Maternal Aunt        60's  . Stomach cancer Maternal Aunt      Current Outpatient Medications (Cardiovascular):  .  icosapent Ethyl (VASCEPA) 1 g capsule, Take 2 capsules (2 g total) by mouth 2 (two) times daily. Marland Kitchen  lisinopril-hydrochlorothiazide (ZESTORETIC) 10-12.5 MG tablet, Take 1 tablet by mouth daily.   Current Outpatient Medications (Analgesics):  .  ibuprofen (ADVIL,MOTRIN) 100 MG tablet, Take 100 mg by mouth every 6 (six) hours as needed for pain.   Current Outpatient Medications (Hematological):  .  cyanocobalamin 1000 MCG tablet, Take 1,000 mcg by mouth daily.   Current Outpatient Medications (Other):  .  cholecalciferol (VITAMIN D) 1000 UNITS tablet, Take 2,000 Units by mouth daily.  .  Glucosamine HCl 500 MG TABS, Take by mouth 2 (two) times daily.  Marland Kitchen  PREVIDENT 5000 BOOSTER PLUS 1.1 % PSTE, PLEASE SEE ATTACHED FOR DETAILED DIRECTIONS .  Turmeric 500 MG TABS, Take 2 capsules by mouth 2 (two) times daily. .  vitamin C (ASCORBIC ACID) 500 MG tablet, Take 500 mg by mouth daily as needed.   Reviewed prior external information including notes and imaging from  primary care provider As well as notes that were available from care everywhere and other healthcare systems.  Past medical history, social, surgical and family history all reviewed in electronic medical record.  [name] pertanent information unless stated regarding to the chief complaint.   Review of Systems:  [name] headache, visual changes, nausea, vomiting, diarrhea, constipation, dizziness, abdominal pain, skin rash, fevers, chills, night sweats, weight loss, swollen lymph nodes, body aches, joint swelling,  chest pain, shortness of breath, mood changes. POSITIVE muscle aches  Objective  Blood pressure 124/82, pulse 83, height 5\' 1"  (1.549 m), weight 127 lb (57.6 kg), SpO2 97 %.   General: [name] apparent distress alert and oriented x3 mood and affect normal, dressed appropriately.  HEENT: Pupils equal, extraocular movements intact  Respiratory: Patient's speak in full sentences and does not appear short of breath  Cardiovascular: [name] lower extremity edema, non tender, [name] erythema  Gait normal with good balance and coordination.  MSK: Right hip has improvement in range of motion.  Very mild discomfort in the piriformis right greater than left.  Patient ankle doing very well.  Peroneal cyst noted on right larger than the left side but full strength.  Neurovascularly intact distally.   Impression and Recommendations:     The above documentation has been reviewed and is accurate and complete Lyndal Pulley, DO

## 2020-09-01 ENCOUNTER — Other Ambulatory Visit (INDEPENDENT_AMBULATORY_CARE_PROVIDER_SITE_OTHER): Payer: Medicare PPO

## 2020-09-01 DIAGNOSIS — E785 Hyperlipidemia, unspecified: Secondary | ICD-10-CM | POA: Diagnosis not present

## 2020-09-01 DIAGNOSIS — R739 Hyperglycemia, unspecified: Secondary | ICD-10-CM | POA: Diagnosis not present

## 2020-09-01 DIAGNOSIS — I10 Essential (primary) hypertension: Secondary | ICD-10-CM | POA: Diagnosis not present

## 2020-09-01 LAB — BASIC METABOLIC PANEL
BUN: 14 mg/dL (ref 6–23)
CO2: 26 mEq/L (ref 19–32)
Calcium: 9.1 mg/dL (ref 8.4–10.5)
Chloride: 100 mEq/L (ref 96–112)
Creatinine, Ser: 0.58 mg/dL (ref 0.40–1.20)
GFR: 89.04 mL/min (ref >60.00)
Glucose, Bld: 104 mg/dL — ABNORMAL HIGH (ref 70–99)
Potassium: 3.9 mEq/L (ref 3.5–5.1)
Sodium: 134 mEq/L — ABNORMAL LOW (ref 135–145)

## 2020-09-01 LAB — LIPID PANEL
Cholesterol: 232 mg/dL — ABNORMAL HIGH (ref 0–200)
HDL: 51.4 mg/dL (ref >39.00)
LDL Cholesterol: 149 mg/dL — ABNORMAL HIGH (ref 0–99)
NonHDL: 181.04
Total CHOL/HDL Ratio: 5
Triglycerides: 159 mg/dL — ABNORMAL HIGH (ref 0.0–149.0)
VLDL: 31.8 mg/dL (ref 0.0–40.0)

## 2020-09-01 LAB — HEMOGLOBIN A1C: Hgb A1c MFr Bld: 5.9 % (ref 4.6–6.5)

## 2020-09-05 ENCOUNTER — Encounter: Payer: Self-pay | Admitting: Internal Medicine

## 2020-09-05 ENCOUNTER — Other Ambulatory Visit: Payer: Self-pay

## 2020-09-05 ENCOUNTER — Ambulatory Visit: Payer: Medicare PPO | Admitting: Internal Medicine

## 2020-09-05 DIAGNOSIS — E559 Vitamin D deficiency, unspecified: Secondary | ICD-10-CM | POA: Diagnosis not present

## 2020-09-05 DIAGNOSIS — E119 Type 2 diabetes mellitus without complications: Secondary | ICD-10-CM

## 2020-09-05 DIAGNOSIS — M24559 Contracture, unspecified hip: Secondary | ICD-10-CM

## 2020-09-05 DIAGNOSIS — I10 Essential (primary) hypertension: Secondary | ICD-10-CM | POA: Diagnosis not present

## 2020-09-05 DIAGNOSIS — E785 Hyperlipidemia, unspecified: Secondary | ICD-10-CM | POA: Diagnosis not present

## 2020-09-05 MED ORDER — ICOSAPENT ETHYL 1 G PO CAPS: 2.0000 g | ORAL_CAPSULE | Freq: Two times a day (BID) | ORAL | 3 refills | Status: DC

## 2020-09-05 MED ORDER — LISINOPRIL-HYDROCHLOROTHIAZIDE 10-12.5 MG PO TABS: 1.0000 | ORAL_TABLET | Freq: Every day | ORAL | 3 refills | Status: DC

## 2020-09-05 NOTE — Assessment & Plan Note (Addendum)
Monitor labs including A1c.  On diet

## 2020-09-05 NOTE — Assessment & Plan Note (Addendum)
Continue with Prinzide

## 2020-09-05 NOTE — Assessment & Plan Note (Signed)
On Vit D 

## 2020-09-05 NOTE — Progress Notes (Signed)
Subjective:  Patient ID: Lisa [name], female    DOB: 12-22-1945  Age: 74 y.o. MRN: [id number]  CC: Follow-up (6 MONTH F/U)   HPI Lisa [name] presents for dyslipidemia, DM, hip pain f/u  Outpatient Medications Prior to Visit  Medication Sig Dispense Refill  . cholecalciferol (VITAMIN D) 1000 UNITS tablet Take 2,000 Units by mouth daily.    . cyanocobalamin 1000 MCG tablet Take 1,000 mcg by mouth daily.     . Glucosamine HCl 500 MG TABS Take by mouth 2 (two) times daily.     Marland Kitchen ibuprofen (ADVIL,MOTRIN) 100 MG tablet Take 100 mg by mouth every 6 (six) hours as needed for pain.     Marland Kitchen icosapent Ethyl (VASCEPA) 1 g capsule Take 2 capsules (2 g total) by mouth 2 (two) times daily. 360 capsule 3  . lisinopril-hydrochlorothiazide (ZESTORETIC) 10-12.5 MG tablet Take 1 tablet by mouth daily. 90 tablet 3  . PREVIDENT 5000 BOOSTER PLUS 1.1 % PSTE PLEASE SEE ATTACHED FOR DETAILED DIRECTIONS    . Turmeric 500 MG TABS Take 2 capsules by mouth 2 (two) times daily.    . vitamin C (ASCORBIC ACID) 500 MG tablet Take 500 mg by mouth daily as needed.     No facility-administered medications prior to visit.    ROS: Review of Systems  Constitutional: Negative for activity change, appetite change, chills, fatigue and unexpected weight change.  HENT: Negative for congestion, mouth sores and sinus pressure.   Eyes: Negative for visual disturbance.  Respiratory: Negative for cough and chest tightness.   Gastrointestinal: Negative for abdominal pain and nausea.  Genitourinary: Negative for difficulty urinating, frequency and vaginal pain.  Musculoskeletal: Positive for arthralgias. Negative for back pain and gait problem.  Skin: Negative for pallor and rash.  Neurological: Negative for dizziness, tremors, weakness, numbness and headaches.  Psychiatric/Behavioral: Negative for confusion, decreased concentration and sleep disturbance.   R hip pain  Objective:  BP 122/70 (BP Location: Left Arm)    Pulse 74   Temp 98.7 F (37.1 C) (Oral)   Wt 127 lb 9.6 oz (57.9 kg)   SpO2 97%   BMI 24.11 kg/m   BP Readings from Last 3 Encounters:  09/05/20 122/70  08/18/20 124/82  07/07/20 134/72    Wt Readings from Last 3 Encounters:  09/05/20 127 lb 9.6 oz (57.9 kg)  08/18/20 127 lb (57.6 kg)  07/07/20 126 lb (57.2 kg)    Physical Exam Constitutional:      General: She is not in acute distress.    Appearance: She is well-developed.  HENT:     Head: Normocephalic.     Right Ear: External ear normal.     Left Ear: External ear normal.     Nose: Nose normal.     Mouth/Throat:     Mouth: Oropharynx is clear and moist.  Eyes:     General:        Right eye: No discharge.        Left eye: No discharge.     Conjunctiva/sclera: Conjunctivae normal.     Pupils: Pupils are equal, round, and reactive to light.  Neck:     Thyroid: No thyromegaly.     Vascular: No JVD.     Trachea: No tracheal deviation.  Cardiovascular:     Rate and Rhythm: Normal rate and regular rhythm.     Heart sounds: Normal heart sounds.  Pulmonary:     Effort: No respiratory distress.     Breath  sounds: No stridor. No wheezing.  Abdominal:     General: Bowel sounds are normal. There is no distension.     Palpations: Abdomen is soft. There is no mass.     Tenderness: There is no abdominal tenderness. There is no guarding or rebound.  Musculoskeletal:        General: Tenderness present. No edema.     Cervical back: Normal range of motion and neck supple.  Lymphadenopathy:     Cervical: No cervical adenopathy.  Skin:    Findings: No erythema or rash.  Neurological:     Cranial Nerves: No cranial nerve deficit.     Motor: No abnormal muscle tone.     Coordination: Coordination normal.     Deep Tendon Reflexes: Reflexes normal.  Psychiatric:        Mood and Affect: Mood and affect normal.        Behavior: Behavior normal.        Thought Content: Thought content normal.        Judgment: Judgment normal.      R hip w/pain Lab Results  Component Value Date   WBC 4.7 07/14/2018   HGB 14.4 07/14/2018   HCT 42.1 07/14/2018   PLT 312.0 07/14/2018   GLUCOSE 104 (H) 09/01/2020   CHOL 232 (H) 09/01/2020   TRIG 159.0 (H) 09/01/2020   HDL 51.40 09/01/2020   LDLDIRECT 170.0 06/26/2017   LDLCALC 149 (H) 09/01/2020   ALT 19 07/14/2018   AST 15 07/14/2018   NA 134 (L) 09/01/2020   K 3.9 09/01/2020   CL 100 09/01/2020   CREATININE 0.58 09/01/2020   BUN 14 09/01/2020   CO2 26 09/01/2020   TSH 0.65 07/14/2018   HGBA1C 5.9 09/01/2020    MM 3D SCREEN BREAST BILATERAL  Result Date: 11/16/2019 CLINICAL DATA:  Screening. EXAM: DIGITAL SCREENING BILATERAL MAMMOGRAM WITH TOMO AND CAD COMPARISON:  Previous exam(s). ACR Breast Density Category c: The breast tissue is heterogeneously dense, which Yeley obscure small masses. FINDINGS: There are no findings suspicious for malignancy. Images were processed with CAD. IMPRESSION: No mammographic evidence of malignancy. A result letter of this screening mammogram will be mailed directly to the patient. RECOMMENDATION: Screening mammogram in one year. (Code:SM-B-01Y) BI-RADS CATEGORY  1: Negative. Electronically Signed   By: Kristopher Oppenheim M.D.   On: 11/16/2019 11:49    Assessment & Plan:

## 2020-09-05 NOTE — Assessment & Plan Note (Signed)
°  CT coronary calcium score 0. Minimal carotid plaque 2019 Cont w/Vascepa

## 2020-09-26 NOTE — Assessment & Plan Note (Signed)
She continues with stretching exercises

## 2020-09-29 DIAGNOSIS — H26492 Other secondary cataract, left eye: Secondary | ICD-10-CM | POA: Diagnosis not present

## 2020-09-29 DIAGNOSIS — Z961 Presence of intraocular lens: Secondary | ICD-10-CM | POA: Diagnosis not present

## 2020-09-29 DIAGNOSIS — E119 Type 2 diabetes mellitus without complications: Secondary | ICD-10-CM | POA: Diagnosis not present

## 2020-09-29 DIAGNOSIS — H35343 Macular cyst, hole, or pseudohole, bilateral: Secondary | ICD-10-CM | POA: Diagnosis not present

## 2020-10-27 DIAGNOSIS — H35343 Macular cyst, hole, or pseudohole, bilateral: Secondary | ICD-10-CM | POA: Diagnosis not present

## 2020-10-27 DIAGNOSIS — H26492 Other secondary cataract, left eye: Secondary | ICD-10-CM | POA: Diagnosis not present

## 2020-10-27 DIAGNOSIS — E119 Type 2 diabetes mellitus without complications: Secondary | ICD-10-CM | POA: Diagnosis not present

## 2020-12-13 ENCOUNTER — Other Ambulatory Visit: Payer: Self-pay | Admitting: Internal Medicine

## 2020-12-13 DIAGNOSIS — Z1231 Encounter for screening mammogram for malignant neoplasm of breast: Secondary | ICD-10-CM

## 2020-12-15 ENCOUNTER — Ambulatory Visit (INDEPENDENT_AMBULATORY_CARE_PROVIDER_SITE_OTHER): Payer: Medicare PPO

## 2020-12-15 ENCOUNTER — Other Ambulatory Visit: Payer: Self-pay

## 2020-12-15 VITALS — BP 120/60 | HR 80 | Temp 97.8°F | Ht 61.0 in | Wt 127.4 lb

## 2020-12-15 DIAGNOSIS — Z0000 Encounter for general adult medical examination without abnormal findings: Secondary | ICD-10-CM | POA: Diagnosis not present

## 2020-12-15 NOTE — Patient Instructions (Addendum)
Lisa [name] , Thank you for taking time to come for your Medicare Wellness Visit. I appreciate your ongoing commitment to your health goals. Please review the following plan we discussed and let me know if I can assist you in the future.   Screening recommendations/referrals: Colonoscopy: 07/29/2018; no repeat due to age 75: 11/13/2019 Bone Density: 07/24/2017 (completed) Recommended yearly ophthalmology/optometry visit for glaucoma screening and checkup Recommended yearly dental visit for hygiene and checkup  Vaccinations: Influenza vaccine: 06/17/2020 Pneumococcal vaccine: 09/17/2008, 2/16/20016 Tdap vaccine: 10/14/2013; due every 10 years Shingles vaccine: never done; can check with local pharmacy   Covid-19: 10/06/2019, 10/27/2019, 06/24/2020  Advanced directives: Advance directive discussed with you today. I have provided a copy for you to complete at home and have notarized. Once this is complete please bring a copy in to our office so we can scan it into your chart.  Conditions/risks identified: Yes; Reviewed health maintenance screenings with patient today and relevant education, vaccines, and/or referrals were provided. Please continue to do your personal lifestyle choices by: daily care of teeth and gums, regular physical activity (goal should be 5 days a week for 30 minutes), eat a healthy diet, avoid tobacco and drug use, limiting any alcohol intake, taking a low-dose aspirin (if not allergic or have been advised by your provider otherwise) and taking vitamins and minerals as recommended by your provider. Continue doing brain stimulating activities (puzzles, reading, adult coloring books, staying active) to keep memory sharp. Continue to eat heart healthy diet (full of fruits, vegetables, whole grains, lean protein, water--limit salt, fat, and sugar intake) and increase physical activity as tolerated.  Next appointment: Please schedule your next Medicare Wellness Visit with your Nurse  Health Advisor in 1 year by calling [phone].  Preventive Care 43 Years and Older, Female Preventive care refers to lifestyle choices and visits with your health care provider that can promote health and wellness. What does preventive care include?  A yearly physical exam. This is also called an annual well check.  Dental exams once or twice a year.  Routine eye exams. Ask your health care provider how often you should have your eyes checked.  Personal lifestyle choices, including:  Daily care of your teeth and gums.  Regular physical activity.  Eating a healthy diet.  Avoiding tobacco and drug use.  Limiting alcohol use.  Practicing safe sex.  Taking low-dose aspirin every day.  Taking vitamin and mineral supplements as recommended by your health care provider. What happens during an annual well check? The services and screenings done by your health care provider during your annual well check will depend on your age, overall health, lifestyle risk factors, and family history of disease. Counseling  Your health care provider Sickinger ask you questions about your:  Alcohol use.  Tobacco use.  Drug use.  Emotional well-being.  Home and relationship well-being.  Sexual activity.  Eating habits.  History of falls.  Memory and ability to understand (cognition).  Work and work Statistician.  Reproductive health. Screening  You Kina have the following tests or measurements:  Height, weight, and BMI.  Blood pressure.  Lipid and cholesterol levels. These Kersten be checked every 5 years, or more frequently if you are over 41 years old.  Skin check.  Lung cancer screening. You Kissler have this screening every year starting at age 80 if you have a 30-pack-year history of smoking and currently smoke or have quit within the past 15 years.  Fecal occult blood test (FOBT) of  the stool. You Renne have this test every year starting at age 82.  Flexible sigmoidoscopy or  colonoscopy. You Faulks have a sigmoidoscopy every 5 years or a colonoscopy every 10 years starting at age 4.  Hepatitis C blood test.  Hepatitis B blood test.  Sexually transmitted disease (STD) testing.  Diabetes screening. This is done by checking your blood sugar (glucose) after you have not eaten for a while (fasting). You Pacitti have this done every 1-3 years.  Bone density scan. This is done to screen for osteoporosis. You Laker have this done starting at age 46.  Mammogram. This Montavon be done every 1-2 years. Talk to your health care provider about how often you should have regular mammograms. Talk with your health care provider about your test results, treatment options, and if necessary, the need for more tests. Vaccines  Your health care provider Prew recommend certain vaccines, such as:  Influenza vaccine. This is recommended every year.  Tetanus, diphtheria, and acellular pertussis (Tdap, Td) vaccine. You Bruun need a Td booster every 10 years.  Zoster vaccine. You Runnels need this after age 28.  Pneumococcal 13-valent conjugate (PCV13) vaccine. One dose is recommended after age 74.  Pneumococcal polysaccharide (PPSV23) vaccine. One dose is recommended after age 36. Talk to your health care provider about which screenings and vaccines you need and how often you need them. This information is not intended to replace advice given to you by your health care provider. Make sure you discuss any questions you have with your health care provider. Document Released: 09/30/2015 Document Revised: 05/23/2016 Document Reviewed: 07/05/2015 Elsevier Interactive Patient Education  2017 [place] Prevention in the Home Falls can cause injuries. They can happen to people of all ages. There are many things you can do to make your home safe and to help prevent falls. What can I do on the outside of my home?  Regularly fix the edges of walkways and driveways and fix any cracks.  Remove  anything that might make you trip as you walk through a door, such as a raised step or threshold.  Trim any bushes or trees on the path to your home.  Use bright outdoor lighting.  Clear any walking paths of anything that might make someone trip, such as rocks or tools.  Regularly check to see if handrails are loose or broken. Make sure that both sides of any steps have handrails.  Any raised decks and porches should have guardrails on the edges.  Have any leaves, snow, or ice cleared regularly.  Use sand or salt on walking paths during winter.  Clean up any spills in your garage right away. This includes oil or grease spills. What can I do in the bathroom?  Use night lights.  Install grab bars by the toilet and in the tub and shower. Do not use towel bars as grab bars.  Use non-skid mats or decals in the tub or shower.  If you need to sit down in the shower, use a plastic, non-slip stool.  Keep the floor dry. Clean up any water that spills on the floor as soon as it happens.  Remove soap buildup in the tub or shower regularly.  Attach bath mats securely with double-sided non-slip rug tape.  Do not have throw rugs and other things on the floor that can make you trip. What can I do in the bedroom?  Use night lights.  Make sure that you have a light by your  bed that is easy to reach.  Do not use any sheets or blankets that are too big for your bed. They should not hang down onto the floor.  Have a firm chair that has side arms. You can use this for support while you get dressed.  Do not have throw rugs and other things on the floor that can make you trip. What can I do in the kitchen?  Clean up any spills right away.  Avoid walking on wet floors.  Keep items that you use a lot in easy-to-reach places.  If you need to reach something above you, use a strong step stool that has a grab bar.  Keep electrical cords out of the way.  Do not use floor polish or wax that  makes floors slippery. If you must use wax, use non-skid floor wax.  Do not have throw rugs and other things on the floor that can make you trip. What can I do with my stairs?  Do not leave any items on the stairs.  Make sure that there are handrails on both sides of the stairs and use them. Fix handrails that are broken or loose. Make sure that handrails are as long as the stairways.  Check any carpeting to make sure that it is firmly attached to the stairs. Fix any carpet that is loose or worn.  Avoid having throw rugs at the top or bottom of the stairs. If you do have throw rugs, attach them to the floor with carpet tape.  Make sure that you have a light switch at the top of the stairs and the bottom of the stairs. If you do not have them, ask someone to add them for you. What else can I do to help prevent falls?  Wear shoes that:  Do not have high heels.  Have rubber bottoms.  Are comfortable and fit you well.  Are closed at the toe. Do not wear sandals.  If you use a stepladder:  Make sure that it is fully opened. Do not climb a closed stepladder.  Make sure that both sides of the stepladder are locked into place.  Ask someone to hold it for you, if possible.  Clearly mark and make sure that you can see:  Any grab bars or handrails.  First and last steps.  Where the edge of each step is.  Use tools that help you move around (mobility aids) if they are needed. These include:  Canes.  Walkers.  Scooters.  Crutches.  Turn on the lights when you go into a dark area. Replace any light bulbs as soon as they burn out.  Set up your furniture so you have a clear path. Avoid moving your furniture around.  If any of your floors are uneven, fix them.  If there are any pets around you, be aware of where they are.  Review your medicines with your doctor. Some medicines can make you feel dizzy. This can increase your chance of falling. Ask your doctor what other  things that you can do to help prevent falls. This information is not intended to replace advice given to you by your health care provider. Make sure you discuss any questions you have with your health care provider. Document Released: 06/30/2009 Document Revised: 02/09/2016 Document Reviewed: 10/08/2014 Elsevier Interactive Patient Education  2017 [employer].

## 2020-12-15 NOTE — Progress Notes (Addendum)
Subjective:   Lisa [name] is a 75 y.o. female who presents for Medicare Annual (Subsequent) preventive examination.  Review of Systems    No ROS. Medicare Wellness Visit. Additional risk factors are reflected in social history. Cardiac Risk Factors include: advanced age (>50men, >44 women);diabetes mellitus;dyslipidemia;hypertension;family history of premature cardiovascular disease   Sleep Patterns: No sleep issues, feels rested on waking and sleeps 8 hours nightly. Home Safety/Smoke Alarms: Feels safe in home; uses home alarm. Smoke alarms in place. Living environment: 1-story home; Lives alone; no needs for DME; good support system. Seat Belt Safety/Bike Helmet: Wears seat belt.    Objective:    Today's Vitals   12/15/20 0807  BP: 120/60  Pulse: 80  Temp: 97.8 F (36.6 C)  SpO2: 98%  Weight: 127 lb 6.4 oz (57.8 kg)  Height: 5\' 1"  (1.549 m)   Body mass index is 24.07 kg/m.  Advanced Directives 12/15/2020 08/04/2019 07/02/2019 06/25/2017  Does Patient Have a Medical Advance Directive? No No No No  Would patient like information on creating a medical advance directive? Yes (MAU/Ambulatory/Procedural Areas - Information given) No - Patient declined - Yes (ED - Information included in AVS)    Current Medications (verified) Outpatient Encounter Medications as of 12/15/2020  Medication Sig  . cholecalciferol (VITAMIN D) 1000 UNITS tablet Take 2,000 Units by mouth daily.  . cyanocobalamin 1000 MCG tablet Take 1,000 mcg by mouth daily.   . Glucosamine HCl 500 MG TABS Take by mouth 2 (two) times daily.   Marland Kitchen ibuprofen (ADVIL,MOTRIN) 100 MG tablet Take 100 mg by mouth every 6 (six) hours as needed for pain.   Marland Kitchen icosapent Ethyl (VASCEPA) 1 g capsule Take 2 capsules (2 g total) by mouth 2 (two) times daily.  Marland Kitchen lisinopril-hydrochlorothiazide (ZESTORETIC) 10-12.5 MG tablet Take 1 tablet by mouth daily.  Marland Kitchen PREVIDENT 5000 BOOSTER PLUS 1.1 % PSTE PLEASE SEE ATTACHED FOR DETAILED  DIRECTIONS  . Turmeric 500 MG TABS Take 2 capsules by mouth 2 (two) times daily.  . vitamin C (ASCORBIC ACID) 500 MG tablet Take 500 mg by mouth daily as needed.   No facility-administered encounter medications on file as of 12/15/2020.    Allergies (verified) Atorvastatin, Ezetimibe-simvastatin, Fish oil, Livalo [pitavastatin calcium], Lovastatin, Rosuvastatin, and Zetia [ezetimibe]   History: Past Medical History:  Diagnosis Date  . Arthritis   . Cataract 2009   right-Dr. Gershon Crane  . Hyperlipidemia   . Hypertension   . Post-operative nausea and vomiting   . Retinal tear 2008   B- Dr. Charolette Forward  . Vitamin D deficiency    Past Surgical History:  Procedure Laterality Date  . APPENDECTOMY  1963  . CATARACT EXTRACTION     Dr. Gershon Crane  . POLYPECTOMY    . RETINAL TEAR REPAIR CRYOTHERAPY     Dr. Charolette Forward   Family History  Problem Relation Age of Onset  . COPD Father   . Heart disease Father   . COPD Sister   . Hyperlipidemia Other   . Alzheimer's disease Mother   . Colon cancer Maternal Aunt        60's  . Stomach cancer Maternal Aunt    Social History   Socioeconomic History  . Marital status: Single    Spouse name: Not on file  . Number of children: Not on file  . Years of education: Not on file  . Highest education level: Not on file  Occupational History  . 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  Smoking status: Never Smoker  . Smokeless tobacco: Never Used  Vaping Use  . Vaping Use: Never used  Substance and Sexual Activity  . Alcohol use: Yes    Comment: wine occ. every week per pt  . Drug use: No  . Sexual activity: Not Currently  Other Topics Concern  . Not on file  Social History Narrative   Regular exercise- Yes   Social Determinants of Health   Financial Resource Strain: Low Risk   . Difficulty of Paying Living Expenses: Not hard at all  Food Insecurity: No Food Insecurity  . Worried About Charity fundraiser in the Last Year:  Never true  . Ran Out of Food in the Last Year: Never true  Transportation Needs: No Transportation Needs  . Lack of Transportation (Medical): No  . Lack of Transportation (Non-Medical): No  Physical Activity: Sufficiently Active  . Days of Exercise per Week: 5 days  . Minutes of Exercise per Session: 30 min  Stress: No Stress Concern Present  . Feeling of Stress : Not at all  Social Connections: Moderately Integrated  . Frequency of Communication with Friends and Family: More than three times a week  . Frequency of Social Gatherings with Friends and Family: More than three times a week  . Attends Religious Services: More than 4 times per year  . Active Member of Clubs or Organizations: No  . Attends Archivist Meetings: More than 4 times per year  . Marital Status: Never married    Tobacco Counseling Counseling given: Not Answered   Clinical Intake:  Pre-visit preparation completed: Yes  Pain : No/denies pain     BMI - recorded: 24.07 Nutritional Status: BMI of 19-24  Normal Nutritional Risks: None Diabetes: Yes CBG done?: No Did pt. bring in CBG monitor from home?: No  How often do you need to have someone help you when you read instructions, pamphlets, or other written materials from your doctor or pharmacy?: 1 - Never What is the last grade level you completed in school?: Master's Degree in Nutrition  Diabetic? yes  Interpreter Needed?: No  Information entered by :: Lisette Abu, LPN   Activities of Daily Living In your present state of health, do you have any difficulty performing the following activities: 12/15/2020  Hearing? N  Vision? N  Difficulty concentrating or making decisions? N  Walking or climbing stairs? N  Dressing or bathing? N  Doing errands, shopping? N  Preparing Food and eating ? N  Using the Toilet? N  In the past six months, have you accidently leaked urine? N  Do you have problems with loss of bowel control? N   Managing your Medications? N  Managing your Finances? N  Housekeeping or managing your Housekeeping? N  Some recent data might be hidden    Patient Care Team: [name], [name], MD as PCP - General Newman Pies, MD (Neurosurgery) Dyke Maes, [place] as Referring Physician (Optometry) Weston Brass, MD (Gynecology) Irene Shipper, MD as Consulting Physician (Gastroenterology) Gerarda Fraction, MD as Referring Physician (Ophthalmology)  Indicate any recent Medical Services you Kurtzman have received from other than [facility] providers in the past year (date Tessmer be approximate).     Assessment:   This is a routine wellness examination for [facility].  Hearing/Vision screen No exam data present  Dietary issues and exercise activities discussed: Current Exercise Habits: Home exercise routine, Type of exercise: stretching;walking;Other - see comments (yard work, gardening, Women's Only Race), Time (Minutes): 30,  Frequency (Times/Week): 5, Weekly Exercise (Minutes/Week): 150, Intensity: Moderate  Goals    . Patient Stated     Continue to exercise, eat healthy and keep my mind active. Stay as healthy and as independent as possible.      Depression Screen PHQ 2/9 Scores 12/15/2020 08/04/2019 06/25/2017 12/07/2015 11/02/2014  PHQ - 2 Score 0 0 0 0 0    Fall Risk Fall Risk  12/15/2020 08/04/2019 06/25/2017 12/07/2015 11/02/2014  Falls in the past year? 1 1 No No Yes  Number falls in past yr: 0 0 - - 2 or more  Injury with Fall? 1 1 - - Yes  Risk for fall due to : Orthopedic patient - - - Other (Comment);Impaired balance/gait  Follow up Falls evaluation completed Falls prevention discussed - - -    FALL RISK PREVENTION PERTAINING TO THE HOME:  Any stairs in or around the home? Yes  If so, are there any without handrails? No  Home free of loose throw rugs in walkways, pet beds, electrical cords, etc? Yes  Adequate lighting in your home to reduce risk of falls? Yes   ASSISTIVE DEVICES  UTILIZED TO PREVENT FALLS:  Life alert? No  Use of a cane, walker or w/c? No  Grab bars in the bathroom? No  Shower chair or bench in shower? No  Elevated toilet seat or a handicapped toilet? No   TIMED UP AND GO:  Was the test performed? No .  Length of time to ambulate 10 feet: 0 sec.   Gait steady and fast without use of assistive device  Cognitive Function: Normal cognitive status assessed by direct observation by this Nurse Health Advisor. No abnormalities found.   MMSE - Mini Mental State Exam 06/25/2017  Orientation to time 5  Orientation to Place 5  Registration 3  Attention/ Calculation 5  Recall 3  Language- name 2 objects 2  Language- repeat 1  Language- follow 3 step command 3  Language- read & follow direction 1  Write a sentence 1  Copy design 1  Total score 30        Immunizations Immunization History  Administered Date(s) Administered  . Fluad Quad(high Dose 65+) 06/17/2020  . Hepatitis A, Adult 06/02/2018  . Influenza Split 07/11/2012  . Influenza, High Dose Seasonal PF 06/20/2017, 06/20/2018, 05/28/2019  . Influenza,inj,Quad PF,6+ Mos 05/28/2016  . Influenza-Unspecified 10/14/2013, 06/17/2014, 06/18/2015  . PFIZER(Purple Top)SARS-COV-2 Vaccination 10/06/2019, 10/27/2019, 06/24/2020  . Pneumococcal Conjugate-13 09/17/2008  . Pneumococcal Polysaccharide-23 11/02/2014  . Td 10/14/2013  . Zoster 03/10/2010    TDAP status: Up to date  Flu Vaccine status: Up to date  Pneumococcal vaccine status: Up to date  Covid-19 vaccine status: Completed vaccines  Qualifies for Shingles Vaccine? Yes   Zostavax completed Yes   Shingrix Completed?: No.    Education has been provided regarding the importance of this vaccine. Patient has been advised to call insurance company to determine out of pocket expense if they have not yet received this vaccine. Advised Lathon also receive vaccine at local pharmacy or Health Dept. Verbalized acceptance and  understanding.  Screening Tests Health Maintenance  Topic Date Due  . FOOT EXAM  Never done  . OPHTHALMOLOGY EXAM  09/05/2019  . COVID-19 Vaccine (4 - Booster for Pfizer series) 12/23/2020  . HEMOGLOBIN A1C  03/02/2021  . MAMMOGRAM  11/12/2021  . TETANUS/TDAP  10/15/2023  . INFLUENZA VACCINE  Completed  . DEXA SCAN  Completed  . Hepatitis C Screening  Completed  .  PNA vac Low Risk Adult  Completed  . HPV VACCINES  Aged Out    Health Maintenance  Health Maintenance Due  Topic Date Due  . FOOT EXAM  Never done  . OPHTHALMOLOGY EXAM  09/05/2019    Colorectal cancer screening: No longer required.   Mammogram status: Completed 11/13/2019. Repeat every year  (scheduled for 02/06/2021)  Bone Density status: Completed 07/24/2017. Results reflect: Bone density results: OSTEOPENIA. Repeat every 0 years. (completed)  Lung Cancer Screening: (Low Dose CT Chest recommended if Age 95-80 years, 30 pack-year currently smoking OR have quit w/in 15years.) does not qualify.   Lung Cancer Screening Referral: no  Additional Screening:  Hepatitis C Screening: does qualify; Completed yes  Vision Screening: Recommended annual ophthalmology exams for early detection of glaucoma and other disorders of the eye. Is the patient up to date with their annual eye exam?  Yes  Who is the provider or what is the name of the office in which the patient attends annual eye exams? Gerarda Fraction, MD. If pt is not established with a provider, would they like to be referred to a provider to establish care? No .   Dental Screening: Recommended annual dental exams for proper oral hygiene  Community Resource Referral / Chronic Care Management: CRR required this visit?  No   CCM required this visit?  No      Plan:     I have personally reviewed and noted the following in the patient's chart:   . Medical and social history . Use of alcohol, tobacco or illicit drugs  . Current medications and  supplements . Functional ability and status . Nutritional status . Physical activity . Advanced directives . List of other physicians . Hospitalizations, surgeries, and ER visits in previous 12 months . Vitals . Screenings to include cognitive, depression, and falls . Referrals and appointments  In addition, I have reviewed and discussed with patient certain preventive protocols, quality metrics, and best practice recommendations. A written personalized care plan for preventive services as well as general preventive health recommendations were provided to patient.     Sheral Flow, LPN   0/24/0973   Nurse Notes:  Medications reviewed with patient; no opioid use noted.  Medical screening examination/treatment/procedure(s) were performed by non-physician practitioner and as supervising physician I was immediately available for consultation/collaboration. I agree with above. Lew Dawes, MD

## 2021-02-06 ENCOUNTER — Other Ambulatory Visit: Payer: Self-pay

## 2021-02-06 ENCOUNTER — Ambulatory Visit
Admission: RE | Admit: 2021-02-06 | Discharge: 2021-02-06 | Disposition: A | Discharge status: Home / Self Care | Payer: Medicare PPO | Source: Ambulatory Visit | Attending: Internal Medicine | Admitting: Internal Medicine

## 2021-02-06 DIAGNOSIS — Z1231 Encounter for screening mammogram for malignant neoplasm of breast: Secondary | ICD-10-CM

## 2021-03-01 ENCOUNTER — Other Ambulatory Visit (INDEPENDENT_AMBULATORY_CARE_PROVIDER_SITE_OTHER): Payer: Medicare PPO

## 2021-03-01 DIAGNOSIS — I10 Essential (primary) hypertension: Secondary | ICD-10-CM

## 2021-03-01 DIAGNOSIS — E785 Hyperlipidemia, unspecified: Secondary | ICD-10-CM

## 2021-03-01 DIAGNOSIS — R739 Hyperglycemia, unspecified: Secondary | ICD-10-CM

## 2021-03-01 LAB — LIPID PANEL
Cholesterol: 235 mg/dL — ABNORMAL HIGH (ref 0–200)
HDL: 52.3 mg/dL (ref >39.00)
LDL Cholesterol: 159 mg/dL — ABNORMAL HIGH (ref 0–99)
NonHDL: 182.54
Total CHOL/HDL Ratio: 4
Triglycerides: 118 mg/dL (ref 0.0–149.0)
VLDL: 23.6 mg/dL (ref 0.0–40.0)

## 2021-03-01 LAB — BASIC METABOLIC PANEL
BUN: 20 mg/dL (ref 6–23)
CO2: 29 mEq/L (ref 19–32)
Calcium: 8.8 mg/dL (ref 8.4–10.5)
Chloride: 101 mEq/L (ref 96–112)
Creatinine, Ser: 0.63 mg/dL (ref 0.40–1.20)
GFR: 86.98 mL/min (ref >60.00)
Glucose, Bld: 107 mg/dL — ABNORMAL HIGH (ref 70–99)
Potassium: 4.1 mEq/L (ref 3.5–5.1)
Sodium: 137 mEq/L (ref 135–145)

## 2021-03-01 LAB — HEMOGLOBIN A1C: Hgb A1c MFr Bld: 6 % (ref 4.6–6.5)

## 2021-03-06 ENCOUNTER — Ambulatory Visit: Payer: Medicare PPO | Admitting: Internal Medicine

## 2021-03-06 ENCOUNTER — Encounter: Payer: Self-pay | Admitting: Internal Medicine

## 2021-03-06 ENCOUNTER — Other Ambulatory Visit: Payer: Self-pay

## 2021-03-06 DIAGNOSIS — E119 Type 2 diabetes mellitus without complications: Secondary | ICD-10-CM | POA: Diagnosis not present

## 2021-03-06 DIAGNOSIS — M7632 Iliotibial band syndrome, left leg: Secondary | ICD-10-CM

## 2021-03-06 DIAGNOSIS — M25561 Pain in right knee: Secondary | ICD-10-CM

## 2021-03-06 DIAGNOSIS — I10 Essential (primary) hypertension: Secondary | ICD-10-CM

## 2021-03-06 DIAGNOSIS — E559 Vitamin D deficiency, unspecified: Secondary | ICD-10-CM | POA: Diagnosis not present

## 2021-03-06 DIAGNOSIS — G8929 Other chronic pain: Secondary | ICD-10-CM

## 2021-03-06 DIAGNOSIS — E785 Hyperlipidemia, unspecified: Secondary | ICD-10-CM

## 2021-03-06 MED ORDER — METFORMIN HCL 500 MG PO TABS: 500.0000 mg | ORAL_TABLET | Freq: Every day | ORAL | 3 refills | Status: DC

## 2021-03-06 MED ORDER — METFORMIN HCL 500 MG PO TABS: 500.0000 mg | ORAL_TABLET | Freq: Every day | ORAL | 11 refills | Status: DC

## 2021-03-06 NOTE — Progress Notes (Signed)
Subjective:  Patient ID: Lisa [name], female    DOB: 12-10-1945  Age: 75 y.o. MRN: [id number]  CC: Follow-up (6 month f/u)   HPI Syna Gad Groninger presents for dyslipidemia, glucose intolerance CBGs 100-128, CBG 140  Pt lost wt   Outpatient Medications Prior to Visit  Medication Sig Dispense Refill   cholecalciferol (VITAMIN D) 1000 UNITS tablet Take 2,000 Units by mouth daily.     cyanocobalamin 1000 MCG tablet Take 1,000 mcg by mouth daily.      Glucosamine HCl 500 MG TABS Take by mouth 2 (two) times daily.      ibuprofen (ADVIL,MOTRIN) 100 MG tablet Take 100 mg by mouth every 6 (six) hours as needed for pain.      icosapent Ethyl (VASCEPA) 1 g capsule Take 2 capsules (2 g total) by mouth 2 (two) times daily. 360 capsule 3   lisinopril-hydrochlorothiazide (ZESTORETIC) 10-12.5 MG tablet Take 1 tablet by mouth daily. 90 tablet 3   PREVIDENT 5000 BOOSTER PLUS 1.1 % PSTE PLEASE SEE ATTACHED FOR DETAILED DIRECTIONS     Turmeric 500 MG TABS Take 2 capsules by mouth 2 (two) times daily.     vitamin C (ASCORBIC ACID) 500 MG tablet Take 500 mg by mouth daily as needed. (Patient not taking: Reported on 03/06/2021)     No facility-administered medications prior to visit.    ROS: Review of Systems  Constitutional:  Negative for activity change, appetite change, chills, fatigue and unexpected weight change.  HENT:  Negative for congestion, mouth sores and sinus pressure.   Eyes:  Negative for visual disturbance.  Respiratory:  Negative for cough and chest tightness.   Gastrointestinal:  Negative for abdominal pain and nausea.  Genitourinary:  Negative for difficulty urinating, frequency and vaginal pain.  Musculoskeletal:  Negative for back pain and gait problem.  Skin:  Negative for pallor and rash.  Neurological:  Negative for dizziness, tremors, weakness, numbness and headaches.  Psychiatric/Behavioral:  Negative for confusion, sleep disturbance and suicidal ideas.    Objective:  BP  132/70 (BP Location: Left Arm)   Pulse 73   Temp 98 F (36.7 C) (Oral)   Ht 5\' 1"  (1.549 m)   Wt 123 lb 12.8 oz (56.2 kg)   SpO2 98%   BMI 23.39 kg/m   BP Readings from Last 3 Encounters:  03/06/21 132/70  12/15/20 120/60  09/05/20 122/70    Wt Readings from Last 3 Encounters:  03/06/21 123 lb 12.8 oz (56.2 kg)  12/15/20 127 lb 6.4 oz (57.8 kg)  09/05/20 127 lb 9.6 oz (57.9 kg)    Physical Exam Constitutional:      General: She is not in acute distress.    Appearance: Normal appearance. She is well-developed.  HENT:     Head: Normocephalic.     Right Ear: External ear normal.     Left Ear: External ear normal.     Nose: Nose normal.  Eyes:     General:        Right eye: No discharge.        Left eye: No discharge.     Conjunctiva/sclera: Conjunctivae normal.     Pupils: Pupils are equal, round, and reactive to light.  Neck:     Thyroid: No thyromegaly.     Vascular: No JVD.     Trachea: No tracheal deviation.  Cardiovascular:     Rate and Rhythm: Normal rate and regular rhythm.     Heart sounds: Normal heart sounds.  Pulmonary:     Effort: No respiratory distress.     Breath sounds: No stridor. No wheezing.  Abdominal:     General: Bowel sounds are normal. There is no distension.     Palpations: Abdomen is soft. There is no mass.     Tenderness: There is no abdominal tenderness. There is no guarding or rebound.  Musculoskeletal:        General: No tenderness.     Cervical back: Normal range of motion and neck supple. No rigidity.  Lymphadenopathy:     Cervical: No cervical adenopathy.  Skin:    Findings: No erythema or rash.  Neurological:     Mental Status: She is oriented to person, place, and time.     Cranial Nerves: No cranial nerve deficit.     Motor: No abnormal muscle tone.     Coordination: Coordination normal.     Deep Tendon Reflexes: Reflexes normal.  Psychiatric:        Behavior: Behavior normal.        Thought Content: Thought content  normal.        Judgment: Judgment normal.    Lab Results  Component Value Date   WBC 4.7 07/14/2018   HGB 14.4 07/14/2018   HCT 42.1 07/14/2018   PLT 312.0 07/14/2018   GLUCOSE 107 (H) 03/01/2021   CHOL 235 (H) 03/01/2021   TRIG 118.0 03/01/2021   HDL 52.30 03/01/2021   LDLDIRECT 170.0 06/26/2017   LDLCALC 159 (H) 03/01/2021   ALT 19 07/14/2018   AST 15 07/14/2018   NA 137 03/01/2021   K 4.1 03/01/2021   CL 101 03/01/2021   CREATININE 0.63 03/01/2021   BUN 20 03/01/2021   CO2 29 03/01/2021   TSH 0.65 07/14/2018   HGBA1C 6.0 03/01/2021    MM 3D SCREEN BREAST BILATERAL  Result Date: 02/07/2021 CLINICAL DATA:  Screening. EXAM: DIGITAL SCREENING BILATERAL MAMMOGRAM WITH TOMOSYNTHESIS AND CAD TECHNIQUE: Bilateral screening digital craniocaudal and mediolateral oblique mammograms were obtained. Bilateral screening digital breast tomosynthesis was performed. The images were evaluated with computer-aided detection. COMPARISON:  Previous exam(s). ACR Breast Density Category c: The breast tissue is heterogeneously dense, which Bryant obscure small masses. FINDINGS: There are no findings suspicious for malignancy. The images were evaluated with computer-aided detection. IMPRESSION: No mammographic evidence of malignancy. A result letter of this screening mammogram will be mailed directly to the patient. RECOMMENDATION: Screening mammogram in one year. (Code:SM-B-01Y) BI-RADS CATEGORY  1: Negative. Electronically Signed   By: Fidela Salisbury M.D.   On: 02/07/2021 16:54    Assessment & Plan:    Walker Kehr, MD

## 2021-03-06 NOTE — Assessment & Plan Note (Signed)
Cont w/stretching

## 2021-03-06 NOTE — Assessment & Plan Note (Addendum)
  We can try Metformin to help w/post-prandials

## 2021-03-06 NOTE — Assessment & Plan Note (Signed)
On Vit D 

## 2021-03-06 NOTE — Assessment & Plan Note (Signed)
Cont w/Vascepa

## 2021-03-06 NOTE — Assessment & Plan Note (Signed)
Better  

## 2021-03-06 NOTE — Assessment & Plan Note (Signed)
On Prinizide

## 2021-03-31 ENCOUNTER — Telehealth: Payer: Self-pay | Admitting: *Deleted

## 2021-03-31 NOTE — Chronic Care Management (AMB) (Signed)
  Chronic Care Management   Note  03/31/2021 Name: Lisa [name] MRN: [id number] DOB: [birth year]  Lisa [name] is a 75 y.o. year old female who is a primary care patient of [name], [name], MD. I reached out to Silvina Hackleman Reale by phone today in response to a referral sent by Ms. Kerrie Buffalo Liera's PCP [name], [name], MD     Ms. Haseley was given information about Chronic Care Management services today including:  CCM service includes personalized support from designated clinical staff supervised by her physician, including individualized plan of care and coordination with other care providers 24/7 contact phone numbers for assistance for urgent and routine care needs. Service will only be billed when office clinical staff spend 20 minutes or more in a month to coordinate care. Only one practitioner [place] furnish and bill the service in a calendar month. The patient Safi stop CCM services at any time (effective at the end of the month) by phone call to the office staff. The patient will be responsible for cost sharing (co-pay) of up to 20% of the service fee (after annual deductible is met).  Patient agreed to services and verbal consent obtained.   Follow up plan: Telephone appointment with care management team member scheduled for:04/25/2021  Julian Hy, [facility]  Direct Dial: [phone]

## 2021-04-25 ENCOUNTER — Ambulatory Visit (INDEPENDENT_AMBULATORY_CARE_PROVIDER_SITE_OTHER): Payer: Medicare PPO | Admitting: *Deleted

## 2021-04-25 DIAGNOSIS — E785 Hyperlipidemia, unspecified: Secondary | ICD-10-CM

## 2021-04-25 DIAGNOSIS — E119 Type 2 diabetes mellitus without complications: Secondary | ICD-10-CM

## 2021-04-25 DIAGNOSIS — I10 Essential (primary) hypertension: Secondary | ICD-10-CM

## 2021-04-25 NOTE — Patient Instructions (Signed)
Visit Information   Tinie, it was nice talking with you today.  Keep up the great work monitoring and writing down your blood sugars at home and staying active- you are doing great!   I look forward to talking to you again for an update on Monday August 28, 2021 at 9:00 am - please be listening out for my call that day.  I will call as close to 9:00 am as possible.   If you need to cancel or re-schedule our telephone visit, please call [phone] and one of our care guides will be happy to assist you.   I look forward to hearing about your progress.   Please don't hesitate to contact me if I can be of assistance to you before our next scheduled telephone appointment.   Oneta Rack, RN, BSN, [facility] RN Care Coordination- [place] [phone]: direct office [phone]: mobile   PATIENT GOALS:   Goals Addressed             This Visit's Progress    Patient Self-Care Activities   On track    Timeframe:  Long-Range Goal Priority:  Low Start Date:      04/25/21                       Expected End Date:       04/922                Patient will self administer medications as prescribed Patient will attend all scheduled provider appointments Patient will call pharmacy for medication refills Patient will call provider office for new concerns or questions Patient will continue to follow carb modified, low sugar, heart healthy diet Patient will continue to participate in regular structured activity and exercise       Exercising to Stay Healthy To become healthy and stay healthy, it is recommended that you do moderate-intensity and vigorous-intensity exercise. You can tell that you are exercising at a moderate intensity if your heart starts beating faster and you start breathing faster but can still hold a conversation. You can tell that you are exercising at a vigorous intensity if you are breathing much harder andfaster and cannot hold a  conversation while exercising. Exercising regularly is important. It has many health benefits, such as: Improving overall fitness, flexibility, and endurance. Increasing bone density. Helping with weight control. Decreasing body fat. Increasing muscle strength. Reducing stress and tension. Improving overall health. How often should I exercise? Choose an activity that you enjoy, and set realistic goals. Your health careprovider can help you make an activity plan that works for you. Exercise regularly as told by your health care provider. This Shoun include: Doing strength training two times a week, such as: Lifting weights. Using resistance bands. Push-ups. Sit-ups. Yoga. Doing a certain intensity of exercise for a given amount of time. Choose from these options: A total of 150 minutes of moderate-intensity exercise every week. A total of 75 minutes of vigorous-intensity exercise every week. A mix of moderate-intensity and vigorous-intensity exercise every week. Children, pregnant women, people who have not exercised regularly, people who are overweight, and older adults Elbert need to talk with a health care provider about what activities are safe to do. If you have a medical condition, be sureto talk with your health care provider before you start a new exercise program. What are some exercise ideas? Moderate-intensity exercise ideas include: Walking 1 mile (1.6 km) in about  15 minutes. Biking. Hiking. Golfing. Dancing. Water aerobics. Vigorous-intensity exercise ideas include: Walking 4.5 miles (7.2 km) or more in about 1 hour. Jogging or running 5 miles (8 km) in about 1 hour. Biking 10 miles (16.1 km) or more in about 1 hour. Lap swimming. Roller-skating or in-line skating. Cross-country skiing. Vigorous competitive sports, such as football, basketball, and soccer. Jumping rope. Aerobic dancing. What are some everyday activities that can help me to get exercise? Yard work,  such as: Psychologist, educational. Raking and bagging leaves. Washing your car. Pushing a stroller. Shoveling snow. Gardening. Washing windows or floors. How can I be more active in my day-to-day activities? Use stairs instead of an elevator. Take a walk during your lunch break. If you drive, park your car farther away from your work or school. If you take public transportation, get off one stop early and walk the rest of the way. Stand up or walk around during all of your indoor phone calls. Get up, stretch, and walk around every 30 minutes throughout the day. Enjoy exercise with a friend. Support to continue exercising will help you keep a regular routine of activity. What guidelines can I follow while exercising? Before you start a new exercise program, talk with your health care provider. Do not exercise so much that you hurt yourself, feel dizzy, or get very short of breath. Wear comfortable clothes and wear shoes with good support. Drink plenty of water while you exercise to prevent dehydration or heat stroke. Work out until your breathing and your heartbeat get faster. Where to find more information U.S. Department of Health and Human Services: [employer]at Centers for Disease Control and Prevention (CDC): http://www.wolf.info/ Summary Exercising regularly is important. It will improve your overall fitness, flexibility, and endurance. Regular exercise also will improve your overall health. It can help you control your weight, reduce stress, and improve your bone density. Do not exercise so much that you hurt yourself, feel dizzy, or get very short of breath. Before you start a new exercise program, talk with your health care provider. This information is not intended to replace advice given to you by your health care provider. Make sure you discuss any questions you have with your healthcare provider. Document Revised: 08/19/2020 Document Reviewed: 08/19/2020 Elsevier Patient Education  [place].   Consent to CCM Services: Ms. Hanif was given information about Chronic Care Management services including:  CCM service includes personalized support from designated clinical staff supervised by her physician, including individualized plan of care and coordination with other care providers 24/7 contact phone numbers for assistance for urgent and routine care needs. Service will only be billed when office clinical staff spend 20 minutes or more in a month to coordinate care. Only one practitioner [place] furnish and bill the service in a calendar month. The patient Turbin stop CCM services at any time (effective at the end of the month) by phone call to the office staff. The patient will be responsible for cost sharing (co-pay) of up to 20% of the service fee (after annual deductible is met).  Patient agreed to services and verbal consent obtained.   Patient verbalizes understanding of instructions provided today and agrees to view in [place].  Telephone follow up appointment with care management team member scheduled for:  Monday August 28, 2021 at 9:00 am The patient has been provided with contact information for the care management team and has been advised to call with any health related questions or concerns.  Oneta Rack, RN, BSN, [facility] RN Care Coordination- [place] [phone]: direct office [phone]: mobile   CLINICAL CARE PLAN: Patient Care Plan: RN Care Manager Plan of Care     Problem Identified: Chronic Disease Management Needs   Priority: Low     Long-Range Goal: Development of plan of care for long term chronic disease management   Start Date: 04/25/2021  Expected End Date: 04/25/2022  Priority: Low  Note:   Current Barriers:  Chronic Disease Management support and education needs related to HLD and DMII  RNCM Clinical Goal(s):  Patient will demonstrate ongoing health management independence DM; HTN; HLD   through collaboration with RN Care manager, provider, and care team.   Interventions: 1:1 collaboration with primary care provider regarding development and update of comprehensive plan of care as evidenced by provider attestation and co-signature Inter-disciplinary care team collaboration (see longitudinal plan of care) Evaluation of current treatment plan related to  self management and patient's adherence to plan as established by provider;   Diabetes:  (Status: New goal.) Lab Results  Component Value Date   HGBA1C 6.0 03/01/2021  Assessed patient's understanding of A1c goal:  6.0 Reviewed medications with patient and discussed importance of medication adherence;        Reviewed prescribed diet with patient carb modified, low sugar heart healthy; Counseled on importance of regular laboratory monitoring as prescribed;        Discussed plans with patient for ongoing care management follow up and provided patient with direct contact information for care management team;      Reviewed scheduled/upcoming provider appointments including: PCP 07/06/21;         Advised patient, providing education and rationale, to check cbg twice daily and record        Review of patient status, including review of consultants reports, relevant laboratory and other test results, and medications completed;       Screening for signs and symptoms of depression related to chronic disease state;        Assessed social determinant of health barriers;        Confirmed patient participates in regular structured activity/ exercise  Hyperlipidemia:  (Status: New goal.) Lab Results  Component Value Date   CHOL 235 (H) 03/01/2021   HDL 52.30 03/01/2021   LDLCALC 159 (H) 03/01/2021   LDLDIRECT 170.0 06/26/2017   TRIG 118.0 03/01/2021   CHOLHDL 4 03/01/2021     Medication review performed; medication list updated in electronic medical record.  Counseled on importance of regular laboratory monitoring as  prescribed; Reviewed role and benefits of statin for ASCVD risk reduction; Discussed strategies to manage statin-induced myalgias; Reviewed importance of limiting foods high in cholesterol; Reviewed exercise goals and target of 150 minutes per week;  Patient Goals/Self-Care Activities: Patient will self administer medications as prescribed Patient will attend all scheduled provider appointments Patient will call pharmacy for medication refills Patient will call provider office for new concerns or questions Patient will continue to follow carb modified, low sugar, heart healthy diet Patient will continue to participate in regular structured activity and exercise

## 2021-04-25 NOTE — Chronic Care Management (AMB) (Addendum)
Chronic Care Management   CCM RN Visit Note  04/25/2021 Name: Lisa [name] MRN: [id number] DOB: 1946/04/16  Subjective: Lisa [name] is a 75 y.o. year old female who is a primary care patient of [name], [name], MD. The care management team was consulted for assistance with disease management and care coordination needs.    Engaged with patient by telephone for initial visit in response to provider referral for case management and/or care coordination services.   Consent to Services:  The patient was given information about Chronic Care Management services, agreed to services, and gave verbal consent 03/31/21 prior to initiation of services.  Please see initial visit note for detailed documentation.  Patient agreed to services and verbal consent obtained.   Assessment: Review of patient past medical history, allergies, medications, health status, including review of consultants reports, laboratory and other test data, was performed as part of comprehensive evaluation and provision of chronic care management services.   SDOH (Social Determinants of Health) assessments and interventions performed:  SDOH Interventions    Flowsheet Row Most Recent Value  SDOH Interventions   Food Insecurity Interventions Intervention Not Indicated  Housing Interventions Intervention Not Indicated  Physical Activity Interventions Intervention Not Indicated  Transportation Interventions Intervention Not Indicated  [Patient drives self]      CCM Care Plan  Allergies  Allergen Reactions   Atorvastatin     REACTION: achy   Ezetimibe-Simvastatin     REACTION: myalgia   Fish Oil     REACTION: bruising   Livalo [Pitavastatin Calcium]     pain   Lovastatin     REACTION: myalgias   Rosuvastatin     REACTION: myalgia   Zetia [Ezetimibe]     Side effects   Outpatient Encounter Medications as of 04/25/2021  Medication Sig Note   cholecalciferol (VITAMIN D) 1000 UNITS tablet Take 2,000 Units by  mouth daily.    cyanocobalamin 1000 MCG tablet Take 1,000 mcg by mouth daily.     Glucosamine HCl 500 MG TABS Take by mouth 2 (two) times daily.     ibuprofen (ADVIL,MOTRIN) 100 MG tablet Take 100 mg by mouth every 6 (six) hours as needed for pain.  04/25/2021: 04/25/21: Patient reports has not needed recently   icosapent Ethyl (VASCEPA) 1 g capsule Take 2 capsules (2 g total) by mouth 2 (two) times daily.    lisinopril-hydrochlorothiazide (ZESTORETIC) 10-12.5 MG tablet Take 1 tablet by mouth daily.    metFORMIN (GLUCOPHAGE) 500 MG tablet Take 1 tablet (500 mg total) by mouth daily.    Turmeric 500 MG TABS Take 2 capsules by mouth 2 (two) times daily.    PREVIDENT 5000 BOOSTER PLUS 1.1 % PSTE PLEASE SEE ATTACHED FOR DETAILED DIRECTIONS (Patient not taking: Reported on 04/25/2021)    No facility-administered encounter medications on file as of 04/25/2021.   Patient Active Problem List   Diagnosis Date Noted   Piriformis muscle pain 07/07/2020   Right ankle pain 07/07/2020   Dysuria 08/24/2019   Low back pain 08/24/2019   Laceration of skin of right knee 07/12/2019   Bruit of right carotid artery 07/16/2018   Abdominal pain 01/06/2018   Right knee pain 12/21/2016   Hip flexor tightness 11/30/2015   Piriformis syndrome of left side 04/20/2014   IT band syndrome 04/20/2014   Frequent falls 04/20/2014   Diabetes mellitus type 2 without retinopathy ([place]) 02/19/2013   Well adult exam 07/13/2012   Left lumbar radiculopathy 04/08/2012   Macular hole 02/28/2012  Status post intraocular lens implant 02/28/2012   Vitamin D deficiency 04/30/2008   Dyslipidemia 10/31/2007   RECENT RETINAL DETACHMENT PARTIAL W/GIANT TEAR 10/31/2007   Essential hypertension 10/31/2007   Conditions to be addressed/monitored:  HTN, HLD, and DMII  Care Plan : RN Care Manager Plan of Care  Updates made by Knox Royalty, RN since 04/25/2021 12:00 AM     Problem: Chronic Disease Management Needs   Priority: Low      Long-Range Goal: Development of plan of care for long term chronic disease management   Start Date: 04/25/2021  Expected End Date: 04/25/2022  Priority: Low  Note:   Current Barriers:  Chronic Disease Management support and education needs related to HLD and DMII  RNCM Clinical Goal(s):  Patient will demonstrate ongoing health management independence DM; HTN; HLD  through collaboration with RN Care manager, provider, and care team.   Interventions: 1:1 collaboration with primary care provider regarding development and update of comprehensive plan of care as evidenced by provider attestation and co-signature Inter-disciplinary care team collaboration (see longitudinal plan of care) Evaluation of current treatment plan related to  self management and patient's adherence to plan as established by provider;   Diabetes:  (Status: New goal.) Lab Results  Component Value Date   HGBA1C 6.0 03/01/2021  Assessed patient's understanding of A1c goal:  6.0 Reviewed medications with patient and discussed importance of medication adherence;        Reviewed prescribed diet with patient carb modified, low sugar heart healthy; Counseled on importance of regular laboratory monitoring as prescribed;        Discussed plans with patient for ongoing care management follow up and provided patient with direct contact information for care management team;      Reviewed scheduled/upcoming provider appointments including: PCP 07/06/21;         Advised patient, providing education and rationale, to check cbg twice daily and record        Review of patient status, including review of consultants reports, relevant laboratory and other test results, and medications completed;       Screening for signs and symptoms of depression related to chronic disease state;        Assessed social determinant of health barriers;        Confirmed patient participates in regular structured activity/ exercise  Hyperlipidemia:   (Status: New goal.) Lab Results  Component Value Date   CHOL 235 (H) 03/01/2021   HDL 52.30 03/01/2021   LDLCALC 159 (H) 03/01/2021   LDLDIRECT 170.0 06/26/2017   TRIG 118.0 03/01/2021   CHOLHDL 4 03/01/2021     Medication review performed; medication list updated in electronic medical record.  Counseled on importance of regular laboratory monitoring as prescribed; Reviewed role and benefits of statin for ASCVD risk reduction; Discussed strategies to manage statin-induced myalgias; Reviewed importance of limiting foods high in cholesterol; Reviewed exercise goals and target of 150 minutes per week;  Patient Goals/Self-Care Activities: Patient will self administer medications as prescribed Patient will attend all scheduled provider appointments Patient will call pharmacy for medication refills Patient will call provider office for new concerns or questions Patient will continue to follow carb modified, low sugar, heart healthy diet Patient will continue to participate in regular structured activity and exercise      Plan: Telephone follow up appointment with care management team member scheduled for:  Monday, August 28, 2021 at 9:00 am The patient has been provided with contact information for the care management team  and has been advised to call with any health related questions or concerns  Oneta Rack, RN, BSN, [place] [phone]: direct office [phone]: mobile    Medical screening examination/treatment/procedure(s) were performed by non-physician practitioner and as supervising physician I was immediately available for consultation/collaboration.  I agree with above. Lew Dawes, MD

## 2021-07-03 ENCOUNTER — Other Ambulatory Visit (INDEPENDENT_AMBULATORY_CARE_PROVIDER_SITE_OTHER): Payer: Medicare PPO

## 2021-07-03 DIAGNOSIS — I10 Essential (primary) hypertension: Secondary | ICD-10-CM

## 2021-07-03 DIAGNOSIS — E785 Hyperlipidemia, unspecified: Secondary | ICD-10-CM

## 2021-07-03 DIAGNOSIS — R739 Hyperglycemia, unspecified: Secondary | ICD-10-CM

## 2021-07-03 LAB — LIPID PANEL
Cholesterol: 231 mg/dL — ABNORMAL HIGH (ref 0–200)
HDL: 52.6 mg/dL (ref >39.00)
LDL Cholesterol: 154 mg/dL — ABNORMAL HIGH (ref 0–99)
NonHDL: 178.01
Total CHOL/HDL Ratio: 4
Triglycerides: 118 mg/dL (ref 0.0–149.0)
VLDL: 23.6 mg/dL (ref 0.0–40.0)

## 2021-07-03 LAB — BASIC METABOLIC PANEL
BUN: 15 mg/dL (ref 6–23)
CO2: 27 mEq/L (ref 19–32)
Calcium: 9.1 mg/dL (ref 8.4–10.5)
Chloride: 101 mEq/L (ref 96–112)
Creatinine, Ser: 0.64 mg/dL (ref 0.40–1.20)
GFR: 86.45 mL/min (ref >60.00)
Glucose, Bld: 107 mg/dL — ABNORMAL HIGH (ref 70–99)
Potassium: 3.9 mEq/L (ref 3.5–5.1)
Sodium: 136 mEq/L (ref 135–145)

## 2021-07-03 LAB — HEMOGLOBIN A1C: Hgb A1c MFr Bld: 5.7 % (ref 4.6–6.5)

## 2021-07-06 ENCOUNTER — Encounter: Payer: Self-pay | Admitting: Internal Medicine

## 2021-07-06 ENCOUNTER — Ambulatory Visit: Payer: Medicare PPO | Admitting: Internal Medicine

## 2021-07-06 ENCOUNTER — Other Ambulatory Visit: Payer: Self-pay

## 2021-07-06 DIAGNOSIS — R296 Repeated falls: Secondary | ICD-10-CM

## 2021-07-06 DIAGNOSIS — S0093XA Contusion of unspecified part of head, initial encounter: Secondary | ICD-10-CM | POA: Insufficient documentation

## 2021-07-06 DIAGNOSIS — S0003XA Contusion of scalp, initial encounter: Secondary | ICD-10-CM | POA: Diagnosis not present

## 2021-07-06 DIAGNOSIS — E119 Type 2 diabetes mellitus without complications: Secondary | ICD-10-CM

## 2021-07-06 DIAGNOSIS — E785 Hyperlipidemia, unspecified: Secondary | ICD-10-CM

## 2021-07-06 DIAGNOSIS — I10 Essential (primary) hypertension: Secondary | ICD-10-CM

## 2021-07-06 DIAGNOSIS — M25561 Pain in right knee: Secondary | ICD-10-CM | POA: Diagnosis not present

## 2021-07-06 NOTE — Progress Notes (Signed)
Subjective:  Patient ID: Lisa [name], female    DOB: [birth year]  Age: 75 y.o. MRN: [id number]  CC: Annual Exam   HPI Lisa [name] presents for a fall on Sat - was vacuuming, tripped and fell, hit head against her vaccume cleaner - forehead, chin on the left. No LOC, HA; no n/v. C/o L knee pain, swelling after the fall - better.  F/u on dyslipidemia  Outpatient Medications Prior to Visit  Medication Sig Dispense Refill   cholecalciferol (VITAMIN D) 1000 UNITS tablet Take 2,000 Units by mouth daily.     cyanocobalamin 1000 MCG tablet Take 1,000 mcg by mouth daily.      Glucosamine HCl 500 MG TABS Take by mouth 2 (two) times daily.      icosapent Ethyl (VASCEPA) 1 g capsule Take 2 capsules (2 g total) by mouth 2 (two) times daily. 360 capsule 3   lisinopril-hydrochlorothiazide (ZESTORETIC) 10-12.5 MG tablet Take 1 tablet by mouth daily. 90 tablet 3   metFORMIN (GLUCOPHAGE) 500 MG tablet Take 1 tablet (500 mg total) by mouth daily. 90 tablet 3   PREVIDENT 5000 BOOSTER PLUS 1.1 % PSTE      Turmeric 500 MG TABS Take 2 capsules by mouth 2 (two) times daily.     ibuprofen (ADVIL,MOTRIN) 100 MG tablet Take 100 mg by mouth every 6 (six) hours as needed for pain.  (Patient not taking: Reported on 07/06/2021)     No facility-administered medications prior to visit.    ROS: Review of Systems  Constitutional:  Negative for activity change, appetite change, chills, fatigue and unexpected weight change.  HENT:  Negative for congestion, mouth sores and sinus pressure.   Eyes:  Negative for visual disturbance.  Respiratory:  Negative for cough and chest tightness.   Gastrointestinal:  Negative for abdominal pain and nausea.  Genitourinary:  Negative for difficulty urinating, frequency and vaginal pain.  Musculoskeletal:  Positive for arthralgias and gait problem. Negative for back pain.  Skin:  Negative for pallor and rash.  Neurological:  Negative for dizziness, tremors, weakness, numbness  and headaches.  Psychiatric/Behavioral:  Negative for confusion and sleep disturbance.    Objective:  BP 132/78 (BP Location: Left Arm)   Pulse 77   Temp 98.4 F (36.9 C) (Oral)   Ht 5\' 1"  (1.549 m)   Wt 120 lb 3.2 oz (54.5 kg)   SpO2 98%   BMI 22.71 kg/m   BP Readings from Last 3 Encounters:  07/06/21 132/78  03/06/21 132/70  12/15/20 120/60    Wt Readings from Last 3 Encounters:  07/06/21 120 lb 3.2 oz (54.5 kg)  03/06/21 123 lb 12.8 oz (56.2 kg)  12/15/20 127 lb 6.4 oz (57.8 kg)    Physical Exam Constitutional:      General: She is not in acute distress.    Appearance: She is well-developed.  HENT:     Head: Normocephalic.     Right Ear: External ear normal.     Left Ear: External ear normal.     Nose: Nose normal.  Eyes:     General:        Right eye: No discharge.        Left eye: No discharge.     Conjunctiva/sclera: Conjunctivae normal.     Pupils: Pupils are equal, round, and reactive to light.  Neck:     Thyroid: No thyromegaly.     Vascular: No JVD.     Trachea: No tracheal deviation.  Cardiovascular:  Rate and Rhythm: Normal rate and regular rhythm.     Heart sounds: Normal heart sounds.  Pulmonary:     Effort: No respiratory distress.     Breath sounds: No stridor. No wheezing.  Abdominal:     General: Bowel sounds are normal. There is no distension.     Palpations: Abdomen is soft. There is no mass.     Tenderness: There is no abdominal tenderness. There is no guarding or rebound.  Musculoskeletal:        General: Tenderness present.     Cervical back: Normal range of motion and neck supple. No rigidity.  Lymphadenopathy:     Cervical: No cervical adenopathy.  Skin:    Findings: No erythema or rash.  Neurological:     Mental Status: She is oriented to person, place, and time.     Cranial Nerves: No cranial nerve deficit.     Motor: No abnormal muscle tone.     Coordination: Coordination normal.     Deep Tendon Reflexes: Reflexes  normal.  Psychiatric:        Behavior: Behavior normal.        Thought Content: Thought content normal.        Judgment: Judgment normal.    Lab Results  Component Value Date   WBC 4.7 07/14/2018   HGB 14.4 07/14/2018   HCT 42.1 07/14/2018   PLT 312.0 07/14/2018   GLUCOSE 107 (H) 07/03/2021   CHOL 231 (H) 07/03/2021   TRIG 118.0 07/03/2021   HDL 52.60 07/03/2021   LDLDIRECT 170.0 06/26/2017   LDLCALC 154 (H) 07/03/2021   ALT 19 07/14/2018   AST 15 07/14/2018   NA 136 07/03/2021   K 3.9 07/03/2021   CL 101 07/03/2021   CREATININE 0.64 07/03/2021   BUN 15 07/03/2021   CO2 27 07/03/2021   TSH 0.65 07/14/2018   HGBA1C 5.7 07/03/2021    MM 3D SCREEN BREAST BILATERAL  Result Date: 02/07/2021 CLINICAL DATA:  Screening. EXAM: DIGITAL SCREENING BILATERAL MAMMOGRAM WITH TOMOSYNTHESIS AND CAD TECHNIQUE: Bilateral screening digital craniocaudal and mediolateral oblique mammograms were obtained. Bilateral screening digital breast tomosynthesis was performed. The images were evaluated with computer-aided detection. COMPARISON:  Previous exam(s). ACR Breast Density Category c: The breast tissue is heterogeneously dense, which [place] obscure small masses. FINDINGS: There are no findings suspicious for malignancy. The images were evaluated with computer-aided detection. IMPRESSION: No mammographic evidence of malignancy. A result letter of this screening mammogram will be mailed directly to the patient. RECOMMENDATION: Screening mammogram in one year. (Code:SM-B-01Y) BI-RADS CATEGORY  1: Negative. Electronically Signed   By: Fidela Salisbury M.D.   On: 02/07/2021 16:54    Assessment & Plan:   Problem List Items Addressed This Visit     Diabetes mellitus type 2 without retinopathy ([place])    On diet and Metformin. Pt wants to stop it - ok to do it. RTC 3 mo w/A1c      Relevant Orders   Hemoglobin A1c   Comprehensive metabolic panel   Dyslipidemia    Cont on Vascepa      Essential  hypertension    Cont on Prinizide      Frequent falls    Overall much better A fall 10/22       Head contusion    A fall 10/22. No concussion CT if problems      Right knee pain    Contusion 10/22 X ray Voltaren gel Sleeve brace  No orders of the defined types were placed in this encounter.     Follow-up: Return for a follow-up visit.  Walker Kehr, MD

## 2021-07-06 NOTE — Assessment & Plan Note (Signed)
Cont on Vascepa

## 2021-07-06 NOTE — Assessment & Plan Note (Signed)
Contusion 10/22 X ray Voltaren gel Sleeve brace

## 2021-07-06 NOTE — Assessment & Plan Note (Addendum)
On diet and Metformin. Pt wants to stop it - ok to do it. RTC 3 mo w/A1c

## 2021-07-06 NOTE — Assessment & Plan Note (Signed)
Cont on Prinizide

## 2021-07-06 NOTE — Assessment & Plan Note (Signed)
A fall 10/22. No concussion CT if problems

## 2021-07-06 NOTE — Assessment & Plan Note (Signed)
Overall much better A fall 10/22

## 2021-08-01 ENCOUNTER — Encounter: Payer: Self-pay | Admitting: Family Medicine

## 2021-08-01 ENCOUNTER — Telehealth (INDEPENDENT_AMBULATORY_CARE_PROVIDER_SITE_OTHER): Payer: Medicare PPO | Admitting: Family Medicine

## 2021-08-01 ENCOUNTER — Telehealth: Payer: Self-pay | Admitting: Internal Medicine

## 2021-08-01 ENCOUNTER — Other Ambulatory Visit: Payer: Self-pay

## 2021-08-01 VITALS — Temp 98.6°F

## 2021-08-01 DIAGNOSIS — U071 COVID-19: Secondary | ICD-10-CM | POA: Diagnosis not present

## 2021-08-01 MED ORDER — BENZONATATE 100 MG PO CAPS
ORAL_CAPSULE | ORAL | 0 refills | Status: DC
Start: 2021-08-01 — End: 2021-10-10

## 2021-08-01 MED ORDER — NIRMATRELVIR/RITONAVIR (PAXLOVID)TABLET: 3.0000 | ORAL_TABLET | Freq: Two times a day (BID) | ORAL | 0 refills | Status: AC

## 2021-08-01 NOTE — Telephone Encounter (Signed)
Patient called back to check on response. Relayed the message has been forwarded to PCP but no response was sent back yet. I offered to schedule a virtual visit again and she agreed. Scheduled a visit for today at 4:00 with Dr. Maudie Mercury.

## 2021-08-01 NOTE — Telephone Encounter (Signed)
Patient states she tested positive for Covid on 07-31-2021, patient states she is having Covid symptoms and requesting a rx for Paxlovid  Informed patient she would need a mychart virtual visit, patient states she is unable to do the mychart visit due to not having phone reception in her home  Patient wants to inform provider of her request for the paxlovid

## 2021-08-01 NOTE — Patient Instructions (Addendum)
HOME CARE TIPS:   -I sent the medication(s) we discussed to your pharmacy: Meds ordered this encounter  Medications   nirmatrelvir/ritonavir EUA (PAXLOVID) 20 x 150 MG & 10 x 100MG TABS    Sig: Take 3 tablets by mouth 2 (two) times daily for 5 days. (Take nirmatrelvir 150 mg two tablets twice daily for 5 days and ritonavir 100 mg one tablet twice daily for 5 days) Patient GFR is > 60    Dispense:  30 tablet    Refill:  0   benzonatate (TESSALON PERLES) 100 MG capsule    Sig: 1-2 capsules up to twice daily as needed    Dispense:  30 capsule    Refill:  0     -I sent in the [place] treatment or referral you requested per our discussion. Please see the information provided below and discuss further with the pharmacist/treatment team.  -If taking Paxlovid, please review all medications, supplement and over the counter drugs with your pharmacist and ask them to check for any interactions.  -there is a chance of rebound illness after finishing your treatment. If you become sick again please isolate for an additional 5 days, plus 5 more days of masking.   -can use tylenol f needed for fevers, aches and pains per instructions  -can use nasal saline a few times per day if you have nasal congestionwell  -stay hydrated, drink plenty of fluids and eat small healthy meals - avoid dairy  -follow up with your doctor in 2-3 days unless improving and feeling better  -stay home while sick, except to seek medical care. If you have COVID19, ideally it would be best to stay home for a full 10 days since the onset of symptoms PLUS one day of no fever and feeling better. Wear a good mask that fits snugly (such as N95 or KN95) if around others to reduce the risk of transmission.  It was nice to meet you today, and I really hope you are feeling better soon. I help Bret Harte out with telemedicine visits on Tuesdays and Thursdays and am available for visits on those days. If you have any concerns or questions  following this visit please schedule a follow up visit with your Primary Care doctor or seek care at a local urgent care clinic to avoid delays in care.    Seek in person care or schedule a follow up video visit promptly if your symptoms worsen, new concerns arise or you are not improving with treatment. Call 911 and/or seek emergency care if your symptoms are severe or life threatening.  FACT SHEET FOR PATIENTS, PARENTS, AND CAREGIVERS EMERGENCY USE AUTHORIZATION (EUA) OF PAXLOVID FOR CORONAVIRUS DISEASE 2019 (COVID-19) You are being given this Fact Sheet because your healthcare provider believes it is necessary to provide you with PAXLOVID for the treatment of mild-to-moderate coronavirus disease (COVID-19) caused by the SARS-CoV-2 virus. This Fact Sheet contains information to help you understand the risks and benefits of taking the PAXLOVID you have received or Guidone receive. The U.S. Food and Drug Administration (FDA) has issued an Emergency Use Authorization (EUA) to make PAXLOVID available during the COVID-19 pandemic (for more details about an EUA please see "What is an Emergency Use Authorization?" at the end of this document). PAXLOVID is not an FDA-approved medicine in the [place]. Read this Fact Sheet for information about PAXLOVID. Talk to your healthcare provider about your options or if you have any questions. It is your choice to take PAXLOVID.  What is COVID-19? COVID-19 is caused by a virus called a coronavirus. You can get COVID-19 through close contact with another person who has the virus. COVID-19 illnesses have ranged from very mild-to-severe, including illness resulting in death. While information so far suggests that most COVID-19 illness is mild, serious illness can happen and Uzzle cause some of your other medical conditions to become worse. Older people and people of all ages with severe, long lasting (chronic) medical conditions like heart disease, lung  disease, and diabetes, for example seem to be at higher risk of being hospitalized for COVID-19.  What is PAXLOVID? PAXLOVID is an investigational medicine used to treat mild-to-moderate COVID-19 in adults and children [4 years of age and older weighing at least 55 pounds (68 kg)] with positive results of direct SARS-CoV-2 viral testing, and who are at high risk for progression to severe COVID-19, including hospitalization or death. PAXLOVID is investigational because it is still being studied. There is limited information about the safety and effectiveness of using PAXLOVID to treat people with mild-to-moderate COVID-19.  The FDA has authorized the emergency use of PAXLOVID for the treatment of mild-tomoderate COVID-19 in adults and children [59 years of age and older weighing at least 29 pounds (5 kg)] with a positive test for the virus that causes COVID-19, and who are at high risk for progression to severe COVID-19, including hospitalization or death, under an EUA. 1 Revised: 02 December 2020   What should I tell my healthcare provider before I take PAXLOVID? Tell your healthcare provider if you: ? Have any allergies ? Have liver or kidney disease ? Are pregnant or plan to become pregnant ? Are breastfeeding a child ? Have any serious illnesses  Tell your healthcare provider about all the medicines you take, including prescription and over-the-counter medicines, vitamins, and herbal supplements. Some medicines Desanctis interact with PAXLOVID and Reeder cause serious side effects. Keep a list of your medicines to show your healthcare provider and pharmacist when you get a new medicine.  You can ask your healthcare provider or pharmacist for a list of medicines that interact with PAXLOVID. Do not start taking a new medicine without telling your healthcare provider. Your healthcare provider can tell you if it is safe to take PAXLOVID with other medicines.  Tell your healthcare provider  if you are taking combined hormonal contraceptive. PAXLOVID Duren affect how your birth control pills work. Females who are able to become pregnant should use another effective alternative form of contraception or an additional barrier method of contraception. Talk to your healthcare provider if you have any questions about contraceptive methods that might be right for you.  How do I take PAXLOVID? ? PAXLOVID consists of 2 medicines: nirmatrelvir and ritonavir. o Take 2 pink tablets of nirmatrelvir with 1 white tablet of ritonavir by mouth 2 times each day (in the morning and in the evening) for 5 days. For each dose, take all 3 tablets at the same time. o If you have kidney disease, talk to your healthcare provider. You Selner need a different dose. ? Swallow the tablets whole. Do not chew, break, or crush the tablets. ? Take PAXLOVID with or without food. ? Do not stop taking PAXLOVID without talking to your healthcare provider, even if you feel better. ? If you miss a dose of PAXLOVID within 8 hours of the time it is usually taken, take it as soon as you remember. If you miss a dose by more than 8 hours, skip the  missed dose and take the next dose at your regular time. Do not take 2 doses of PAXLOVID at the same time. ? If you take too much PAXLOVID, call your healthcare provider or go to the nearest hospital emergency room right away. ? If you are taking a ritonavir- or cobicistat-containing medicine to treat hepatitis C or Human Immunodeficiency Virus (HIV), you should continue to take your medicine as prescribed by your healthcare provider. 2 Revised: 02 December 2020    Talk to your healthcare provider if you do not feel better or if you feel worse after 5 days.  Who should generally not take PAXLOVID? Do not take PAXLOVID if: ? You are allergic to nirmatrelvir, ritonavir, or any of the ingredients in PAXLOVID. ? You are taking any of the following medicines: o Alfuzosin o  Pethidine, propoxyphene o Ranolazine o Amiodarone, dronedarone, flecainide, propafenone, quinidine o Colchicine o Lurasidone, pimozide, clozapine o Dihydroergotamine, ergotamine, methylergonovine o Lovastatin, simvastatin o Sildenafil (Revatio) for pulmonary arterial hypertension (PAH) o Triazolam, oral midazolam o Apalutamide o Carbamazepine, phenobarbital, phenytoin o Rifampin o St. John's Wort (hypericum perforatum) Taking PAXLOVID with these medicines Olliff cause serious or life-threatening side effects or affect how PAXLOVID works.  These are not the only medicines that Bussie cause serious side effects if taken with PAXLOVID. PAXLOVID [place] increase or decrease the levels of multiple other medicines. It is very important to tell your healthcare provider about all of the medicines you are taking because additional laboratory tests or changes in the dose of your other medicines Darrough be necessary while you are taking PAXLOVID. Your healthcare provider Chapa also tell you about specific symptoms to watch out for that Michele indicate that you need to stop or decrease the dose of some of your other medicines.  What are the important possible side effects of PAXLOVID? Possible side effects of PAXLOVID are: ? Allergic Reactions. Allergic reactions can happen in people taking PAXLOVID, even after only 1 dose. Stop taking PAXLOVID and call your healthcare provider right away if you get any of the following symptoms of an allergic reaction: o hives o trouble swallowing or breathing o swelling of the mouth, lips, or face o throat tightness o hoarseness 3 Revised: 02 December 2020  o skin rash ? Liver Problems. Tell your healthcare provider right away if you have any of these signs and symptoms of liver problems: loss of appetite, yellowing of your skin and the whites of eyes (jaundice), dark-colored urine, pale colored stools and itchy skin, stomach area (abdominal) pain. ? Resistance to HIV  Medicines. If you have untreated HIV infection, PAXLOVID Andis lead to some HIV medicines not working as well in the future. ? Other possible side effects include: o altered sense of taste o diarrhea o high blood pressure o muscle aches These are not all the possible side effects of PAXLOVID. Not many people have taken PAXLOVID. Serious and unexpected side effects Jimerson happen. PAXLOVID is still being studied, so it is possible that all of the risks are not known at this time.  What other treatment choices are there? Veklury (remdesivir) is FDA-approved for the treatment of mild-to-moderate ZLDJT-70 in certain adults and children. Talk with your doctor to see if Marijean Heath is appropriate for you. Like PAXLOVID, FDA Poppell also allow for the emergency use of other medicines to treat people with COVID-19. Go to https://price.info/ for information on the emergency use of other medicines that are authorized by FDA to treat people with COVID-19. Your healthcare provider  Gaughran talk with you about clinical trials for which you Traber be eligible. It is your choice to be treated or not to be treated with PAXLOVID. Should you decide not to receive it or for your child not to receive it, it will not change your standard medical care.  What if I am pregnant or breastfeeding? There is no experience treating pregnant women or breastfeeding mothers with PAXLOVID. For a mother and unborn baby, the benefit of taking PAXLOVID Dahm be greater than the risk from the treatment. If you are pregnant, discuss your options and specific situation with your healthcare provider. It is recommended that you use effective barrier contraception or do not have sexual activity while taking PAXLOVID. If you are breastfeeding, discuss your options and specific situation with your healthcare provider. 4 Revised: 02 December 2020   How do I report side effects with PAXLOVID? Contact your healthcare provider if you have any side effects that bother you or do not go away. Report side effects to FDA MedWatch at SmoothHits.hu or call 1-800-FDA1088 or you can report side effects to [employer]. at the contact information provided below. Website Fax number Telephone number www.pfizersafetyreporting.com [phone] How should I store Bethalto? Store PAXLOVID tablets at room temperature, between 68?F to 77?F (20?C to 25?C). How can I learn more about COVID-19? ? Ask your healthcare provider. ? Visit https://jacobson-johnson.com/. ? Contact your local or state public health department. What is an Emergency Use Authorization (EUA)? The [place] FDA has made PAXLOVID available under an emergency access mechanism called an Emergency Use Authorization (EUA). The EUA is supported by a Education officer, museum and Human Service (HHS) declaration that circumstances exist to justify the emergency use of drugs and biological products during the COVID-19 pandemic. PAXLOVID for the treatment of mild-to-moderate COVID-19 in adults and children [85 years of age and older weighing at least 37 pounds (75 kg)] with positive results of direct SARS-CoV-2 viral testing, and who are at high risk for progression to severe COVID-19, including hospitalization or death, has not undergone the same type of review as an FDA-approved product. In issuing an EUA under the FMBWG-66 public health emergency, the FDA has determined, among other things, that based on the total amount of scientific evidence available including data from adequate and well-controlled clinical trials, if available, it is reasonable to believe that the product Turnley be effective for diagnosing, treating, or preventing COVID-19, or a serious or life-threatening disease or condition caused by COVID-19; that the known and potential benefits of the  product, when used to diagnose, treat, or prevent such disease or condition, outweigh the known and potential risks of such product; and that there are no adequate, approved, and available alternatives. All of these criteria must be met to allow for the product to be used in the treatment of patients during the COVID-19 pandemic. The EUA for PAXLOVID is in effect for the duration of the COVID-19 declaration justifying emergency use of this product, unless terminated or revoked (after which the products Bozza no longer be used under the EUA). 5 Revised: 02 December 2020     Additional Information For general questions, visit the website or call the telephone number provided below. Website Telephone number www.COVID19oralRx.com [phone] (1-877-C19-PACK) You can also go to www.pfizermedinfo.com or call [phone] for more information. [id number]-[id number] Revised: 02 December 2020

## 2021-08-01 NOTE — Progress Notes (Signed)
Virtual Visit via Telephone Note  I connected with Lisa [name] on 08/01/21 at  4:00 PM EST by telephone and verified that I am speaking with the correct person using two identifiers.   I discussed the limitations, risks, security and privacy concerns of performing an evaluation and management service by telephone and the availability of in person appointments. I also discussed with the patient that there Grondahl be a patient responsible charge related to this service. The patient expressed understanding and agreed to proceed.  Location patient: home, [place] Location provider: work or home office Participants present for the call: patient, provider Patient did not have a visit with me in the prior 7 days to address this/these issue(s).   History of Present Illness:  Acute telemedicine visit for Covid19: -Onset: yesterday; tested positive for covid -Symptoms include: dry scratchy throat, sore throat, nasal congestion, cough -Denies:fever, CP, SOB, NVD, inability to eat/drink/get out of bed -Pertinent past medical history: see below -Pertinent medication allergies:  Allergies  Allergen Reactions   Atorvastatin     REACTION: achy   Ezetimibe-Simvastatin     REACTION: myalgia   Fish Oil     REACTION: bruising   Livalo [Pitavastatin Calcium]     pain   Lovastatin     REACTION: myalgias   Rosuvastatin     REACTION: myalgia   Zetia [Ezetimibe]     Side effects  -COVID-19 vaccine status:  Immunization History  Administered Date(s) Administered   Fluad Quad(high Dose 65+) 06/17/2020   Hepatitis A, Adult 06/02/2018   Influenza Split 07/11/2012   Influenza, High Dose Seasonal PF 06/20/2017, 06/20/2018, 05/28/2019   Influenza,inj,Quad PF,6+ Mos 05/28/2016   Influenza-Unspecified 10/14/2013, 06/17/2014, 06/18/2015, 07/13/2021   PFIZER(Purple Top)SARS-COV-2 Vaccination 10/06/2019, 10/27/2019, 06/24/2020   Pneumococcal Conjugate-13 09/17/2008   Pneumococcal Polysaccharide-23 11/02/2014    Td 10/14/2013   Zoster, Live 03/10/2010  -GFR 86 recently   Past Medical History:  Diagnosis Date   Arthritis    Cataract 2009   right-Dr. Gershon Crane   Hyperlipidemia    Hypertension    Post-operative nausea and vomiting    Retinal tear 2008   B- Dr. Charolette Forward   Vitamin D deficiency     Observations/Objective: Patient sounds cheerful and well on the phone. I do not appreciate any SOB. Speech and thought processing are grossly intact. Patient reported vitals:  Assessment and Plan:  COVID-19   Discussed treatment options, ideal treatment window, potential complications, isolation and precautions for COVID-19.  Discussed possibility of rebound with antivirals and the need to reisolate if it should occur for 5 days. Checked for/reviewed any labs done in the last 90 days with GFR listed in HPI if available. After lengthy discussion, the patient opted for treatment with Paxlovid due to being higher risk for complications of covid or severe disease and other factors. Discussed EUA status of this drug and the fact that there is preliminary limited knowledge of risks/interactions/side effects per EUA document vs possible benefits and precautions. This information was shared with patient during the visit and also was provided in patient instructions. Also, advised that patient discuss risks/interactions and use with pharmacist/treatment team as well.  The patient did want a prescription for cough, Tessalon Rx sent.  Other symptomatic care measures summarized in patient instructions.  Work/School slipped offered:  declined Advised to seek prompt in person care if worsening, new symptoms arise, or if is not improving with treatment. Advised of options for inperson care in case PCP office not available. Did let  the patient know that I only do telemedicine shifts for [facility] on Tuesdays and Thursdays and advised a follow up visit with PCP or at an [facility] if has further questions or concerns.   Follow Up  Instructions:  I did not refer this patient for an OV with me in the next 24 hours for this/these issue(s).  I discussed the assessment and treatment plan with the patient. The patient was provided an opportunity to ask questions and all were answered. The patient agreed with the plan and demonstrated an understanding of the instructions.   I spent 19 minutes on the date of this visit in the care of this patient. See summary of tasks completed to properly care for this patient in the detailed notes above which also included counseling of above, review of PMH, medications, allergies, evaluation of the patient and ordering and/or  instructing patient on testing and care options.     Lucretia Kern, DO

## 2021-08-02 NOTE — Telephone Encounter (Signed)
She had a video visit with Dr. Maudie Mercury and Paxlovid was prescribed.  Thanks

## 2021-08-17 ENCOUNTER — Telehealth: Payer: Self-pay | Admitting: Internal Medicine

## 2021-08-17 NOTE — Telephone Encounter (Signed)
Tiffany from [facility] calling in  Says they recommend patient be on a statin medication due to her age & to decrease any cardiovascular issues  CB # [phone] 

## 2021-08-28 ENCOUNTER — Ambulatory Visit: Payer: Medicare PPO | Admitting: *Deleted

## 2021-08-28 DIAGNOSIS — I10 Essential (primary) hypertension: Secondary | ICD-10-CM

## 2021-08-28 DIAGNOSIS — E785 Hyperlipidemia, unspecified: Secondary | ICD-10-CM

## 2021-08-28 DIAGNOSIS — E119 Type 2 diabetes mellitus without complications: Secondary | ICD-10-CM

## 2021-08-28 NOTE — Chronic Care Management (AMB) (Addendum)
Chronic Care Management   CCM RN Visit Note  08/28/2021 Name: Lisa [name] MRN: [id number] DOB: 1946-07-27  Subjective: Lisa [name] is a 75 y.o. year old female who is a primary care patient of [name], [name], MD. The care management team was consulted for assistance with disease management and care coordination needs.    Engaged with patient by telephone for follow up visit in response to provider referral for case management and/or care coordination services.   Consent to Services:  The patient was given information about Chronic Care Management services, agreed to services, and gave verbal consent prior to initiation of services.  Please see initial visit note for detailed documentation.  Patient agreed to services and verbal consent obtained.   Assessment: Review of patient past medical history, allergies, medications, health status, including review of consultants reports, laboratory and other test data, was performed as part of comprehensive evaluation and provision of chronic care management services.   SDOH (Social Determinants of Health) assessments and interventions performed:  SDOH Interventions    Flowsheet Row Most Recent Value  SDOH Interventions   Food Insecurity Interventions Intervention Not Indicated  Housing Interventions Intervention Not Indicated  Transportation Interventions Intervention Not Indicated       CCM Care Plan Allergies  Allergen Reactions   Atorvastatin     REACTION: achy   Ezetimibe-Simvastatin     REACTION: myalgia   Fish Oil     REACTION: bruising   Livalo [Pitavastatin Calcium]     pain   Lovastatin     REACTION: myalgias   Rosuvastatin     REACTION: myalgia   Zetia [Ezetimibe]     Side effects   Outpatient Encounter Medications as of 08/28/2021  Medication Sig   cholecalciferol (VITAMIN D) 1000 UNITS tablet Take 2,000 Units by mouth daily.   cyanocobalamin 1000 MCG tablet Take 1,000 mcg by mouth daily.    Glucosamine  HCl 500 MG TABS Take by mouth 2 (two) times daily.    icosapent Ethyl (VASCEPA) 1 g capsule Take 2 capsules (2 g total) by mouth 2 (two) times daily.   lisinopril-hydrochlorothiazide (ZESTORETIC) 10-12.5 MG tablet Take 1 tablet by mouth daily.   metFORMIN (GLUCOPHAGE) 500 MG tablet Take 1 tablet (500 mg total) by mouth daily.   Turmeric 500 MG TABS Take 2 capsules by mouth 2 (two) times daily.   benzonatate (TESSALON PERLES) 100 MG capsule 1-2 capsules up to twice daily as needed (Patient not taking: Reported on 08/28/2021)   No facility-administered encounter medications on file as of 08/28/2021.   Patient Active Problem List   Diagnosis Date Noted   Head contusion 07/06/2021   Piriformis muscle pain 07/07/2020   Right ankle pain 07/07/2020   Dysuria 08/24/2019   Low back pain 08/24/2019   Laceration of skin of right knee 07/12/2019   Bruit of right carotid artery 07/16/2018   Abdominal pain 01/06/2018   Right knee pain 12/21/2016   Hip flexor tightness 11/30/2015   Piriformis syndrome of left side 04/20/2014   IT band syndrome 04/20/2014   Frequent falls 04/20/2014   Diabetes mellitus type 2 without retinopathy ([place]) 02/19/2013   Well adult exam 07/13/2012   Left lumbar radiculopathy 04/08/2012   Macular hole 02/28/2012   Status post intraocular lens implant 02/28/2012   Vitamin D deficiency 04/30/2008   Dyslipidemia 10/31/2007   RECENT RETINAL DETACHMENT PARTIAL W/GIANT TEAR 10/31/2007   Essential hypertension 10/31/2007   Conditions to be addressed/monitored: HTN, HLD, and DMII  Care Plan : RN Care Manager Plan of Care  Updates made by Knox Royalty, RN since 08/28/2021 12:00 AM     Problem: Chronic Disease Management Needs   Priority: Low     Long-Range Goal: Development of plan of care for long term chronic disease management   Start Date: 04/25/2021  Expected End Date: 04/25/2022  Priority: Low  Note:   Current Barriers:  Chronic Disease Management support and  education needs related to HLD and DMII  RNCM Clinical Goal(s):  Patient will demonstrate ongoing health management independence DM; HTN; HLD  through collaboration with RN Care manager, provider, and care team.   Interventions: 1:1 collaboration with primary care provider regarding development and update of comprehensive plan of care as evidenced by provider attestation and co-signature Inter-disciplinary care team collaboration (see longitudinal plan of care) Evaluation of current treatment plan related to  self management and patient's adherence to plan as established by provider; Discussed recent COVID-positive diagnosis: confirmed patient recuperated well, having no ongoing symptoms; she questions if she should go ahead and get (second) booster vaccine: advised should be fine to obtain, given covid-positive diagnosis several weeks ago-- however, advised her to contact her outpatient pharmacy to advise definitively around timeline  Diabetes:  (Status: Goal on Track (progressing): YES.) Lab Results  Component Value Date   HGBA1C 5.7 07/03/2021  Reviewed medications with patient and discussed importance of medication adherence;  confirmed patient has re-started metformin after stopping briefly after last PCP office visit 07/03/21 ; continues self-managing medications, independently     Reviewed prescribed diet with patient carb modified, low sugar heart healthy; confirmed patient continues following recommended diet Counseled on importance of regular laboratory monitoring as prescribed;   reviewed most recent A1-C test value result from 07/03/21     Reviewed recent PCP office visit 07/03/21-- confirms no falls since October: fall prevention discussed/ reinforced; patient does not need to use assistive devices Discussed plans with patient for ongoing care management follow up and provided patient with direct contact information for care management team;      Reviewed scheduled/upcoming provider  appointments including: PCP 10/10/21         Advised patient, providing education and rationale, to continue monitoring/ recording fasting blood sugars several times each week   Reviewed recent fasting blood sugars at home: she reports consistent ranges between 104-110      Review of patient status, including review of consultants reports, relevant laboratory and other test results, and medications completed      Updated social determinant of health barriers;  no concerns identified      Confirmed patient participates in regular structured activity/ exercise as much as possible around weather conditions: was able to participate in Women's Only walk this fall  Hyperlipidemia:  (Status: Goal on Track (progressing): YES.) Lab Results  Component Value Date   CHOL 231 (H) 07/03/2021   HDL 52.60 07/03/2021   LDLCALC 154 (H) 07/03/2021   LDLDIRECT 170.0 06/26/2017   TRIG 118.0 07/03/2021   CHOLHDL 4 07/03/2021  Medication review performed; medication list updated in electronic medical record.  Counseled on importance of regular laboratory monitoring as prescribed; Reviewed importance of limiting foods high in cholesterol; Reviewed exercise goals and target of 150 minutes per week;  Patient Goals/Self-Care Activities: As evidenced by review of EHR, collaboration with care team, and patient reporting during CCM RN CM outreach,  Patient Burman Nieves will: Take medications as prescribed Attend all scheduled provider appointments Call pharmacy for medication refills Call  provider office for new concerns or questions Continue to follow carb modified, low sugar, heart healthy diet Continue to participate in regular structured activity and exercise as much as possible around weather conditions Continue to monitor fasting blood sugars several times weekly Continue to follow efforts to prevent falls    Plan: Telephone follow up appointment with care management team member scheduled for:  Monday, January 01, 2022 at 9:00 am The patient has been provided with contact information for the care management team and has been advised to call with any health related questions or concerns  Oneta Rack, RN, BSN, [place] [phone]: direct office   Medical screening examination/treatment/procedure(s) were performed by non-physician practitioner and as supervising physician I was immediately available for consultation/collaboration.  I agree with above. Lew Dawes, MD

## 2021-08-28 NOTE — Patient Instructions (Addendum)
Visit [place], thank you for taking time to talk with me today. Please don't hesitate to contact me if I can be of assistance to you before our next scheduled telephone appointment.  Below are the goals we discussed today:   Patient Self-Care Activities: [name] will: Take medications as prescribed Attend all scheduled provider appointments Call pharmacy for medication refills Call provider office for new concerns or questions Continue to follow carb modified, low sugar, heart healthy diet Continue to participate in regular structured activity and exercise as much as possible around weather conditions Continue to monitor fasting blood sugars several times weekly Continue to follow efforts to prevent falls  Our next scheduled telephone follow up visit/ appointment with care management team member is scheduled on:  Monday, April 17,2023 at 9:00 am  If you need to cancel or re-schedule our visit, please call [phone] and our care guide team will be happy to assist you.   I look forward to hearing about your progress.   Oneta Rack, RN, BSN, [place] [phone]: direct office  If you are experiencing a Mental Health or [place] or need someone to talk to, please  call the Suicide and Crisis Lifeline: 988 call the [place] National Suicide Prevention Lifeline: (219)286-5838 or TTY: (818) 557-6595 TTY 8733779080) to talk to a trained counselor call 1-800-273-TALK (toll free, 24 hour hotline) go to [facility] [address], [place] [phone]) call 911   The patient verbalized understanding of instructions, educational materials, and care plan provided today and agreed to receive a mailed copy of patient instructions, educational materials, and care plan  Diabetes Mellitus and Exercise Exercising regularly is important for overall health,  especially for people who have diabetes mellitus. Exercising is not only about losing weight. It has many other health benefits, such as increasing muscle strength and bone density and reducing body fat and stress. This leads to improved fitness, flexibility, and endurance, all of which result in better overall health. What are the benefits of exercise if I have diabetes? Exercise has many benefits for people with diabetes. They include: Helping to lower and control blood sugar (glucose). Helping the body to respond better to the hormone insulin by improving insulin sensitivity. Reducing how much insulin the body needs. Lowering the risk for heart disease by: Lowering "bad" cholesterol and triglyceride levels. Increasing "good" cholesterol levels. Lowering blood pressure. Lowering blood glucose levels. What is my activity plan? Your health care provider or certified diabetes educator can help you make a plan for the type and frequency of exercise that works for you. This is called your activity plan. Be sure to: Get at least 150 minutes of medium-intensity or high-intensity exercise each week. Exercises [place] include brisk walking, biking, or water aerobics. Do stretching and strengthening exercises, such as yoga or weight lifting, at least 2 times a week. Spread out your activity over at least 3 days of the week. Get some form of physical activity each day. Do not go more than 2 days in a row without some kind of physical activity. Avoid being inactive for more than 90 minutes at a time. Take frequent breaks to walk or stretch. Choose exercises or activities that you enjoy. Set realistic goals. Start slowly and gradually increase your exercise intensity over time. How do I manage my diabetes during exercise? Monitor your blood glucose Check your blood glucose before and after exercising. If your  blood glucose is: 240 mg/dL (13.3 mmol/L) or higher before you exercise, check your urine for  ketones. These are chemicals created by the liver. If you have ketones in your urine, do not exercise until your blood glucose returns to normal. 100 mg/dL (5.6 mmol/L) or lower, eat a snack containing 15-20 grams of carbohydrate. Check your blood glucose 15 minutes after the snack to make sure that your glucose level is above 100 mg/dL (5.6 mmol/L) before you start your exercise. Know the symptoms of low blood glucose (hypoglycemia) and how to treat it. Your risk for hypoglycemia increases during and after exercise. Follow these tips and your health care provider's instructions Keep a carbohydrate snack that is fast-acting for use before, during, and after exercise to help prevent or treat hypoglycemia. Avoid injecting insulin into areas of the body that are going to be exercised. For example, avoid injecting insulin into: Your arms, when you are about to play tennis. Your legs, when you are about to go jogging. Keep records of your exercise habits. Doing this can help you and your health care provider adjust your diabetes management plan as needed. Write down: Food that you eat before and after you exercise. Blood glucose levels before and after you exercise. The type and amount of exercise you have done. Work with your health care provider when you start a new exercise or activity. He or she Nanni need to: Make sure that the activity is safe for you. Adjust your insulin, other medicines, and food that you eat. Drink plenty of water while you exercise. This prevents loss of water (dehydration) and problems caused by a lot of heat in the body (heat stroke). Where to find more information American Diabetes Association: www.diabetes.org Summary Exercising regularly is important for overall health, especially for people who have diabetes mellitus. Exercising has many health benefits. It increases muscle strength and bone density and reduces body fat and stress. It also lowers and controls blood  glucose. Your health care provider or certified diabetes educator can help you make an activity plan for the type and frequency of exercise that works for you. Work with your health care provider to make sure any new activity is safe for you. Also work with your health care provider to adjust your insulin, other medicines, and the food you eat. This information is not intended to replace advice given to you by your health care provider. Make sure you discuss any questions you have with your health care provider. Document Revised: 06/01/2019 Document Reviewed: 06/01/2019 Elsevier Patient Education  [place].

## 2021-09-07 ENCOUNTER — Other Ambulatory Visit: Payer: Self-pay | Admitting: Internal Medicine

## 2021-10-09 ENCOUNTER — Other Ambulatory Visit (INDEPENDENT_AMBULATORY_CARE_PROVIDER_SITE_OTHER): Payer: Medicare PPO

## 2021-10-09 DIAGNOSIS — E119 Type 2 diabetes mellitus without complications: Secondary | ICD-10-CM | POA: Diagnosis not present

## 2021-10-09 DIAGNOSIS — E785 Hyperlipidemia, unspecified: Secondary | ICD-10-CM

## 2021-10-09 LAB — COMPREHENSIVE METABOLIC PANEL
ALT: 16 U/L (ref 0–35)
AST: 15 U/L (ref 0–37)
Albumin: 3.9 g/dL (ref 3.5–5.2)
Alkaline Phosphatase: 54 U/L (ref 39–117)
BUN: 14 mg/dL (ref 6–23)
CO2: 29 mEq/L (ref 19–32)
Calcium: 9.2 mg/dL (ref 8.4–10.5)
Chloride: 101 mEq/L (ref 96–112)
Creatinine, Ser: 0.61 mg/dL (ref 0.40–1.20)
GFR: 87.29 mL/min (ref >60.00)
Glucose, Bld: 93 mg/dL (ref 70–99)
Potassium: 3.8 mEq/L (ref 3.5–5.1)
Sodium: 138 mEq/L (ref 135–145)
Total Bilirubin: 0.5 mg/dL (ref 0.2–1.2)
Total Protein: 6.7 g/dL (ref 6.0–8.3)

## 2021-10-09 LAB — HEMOGLOBIN A1C: Hgb A1c MFr Bld: 5.8 % (ref 4.6–6.5)

## 2021-10-09 LAB — LIPID PANEL
Cholesterol: 239 mg/dL — ABNORMAL HIGH (ref 0–200)
HDL: 51.9 mg/dL (ref >39.00)
NonHDL: 186.64
Total CHOL/HDL Ratio: 5
Triglycerides: 226 mg/dL — ABNORMAL HIGH (ref 0.0–149.0)
VLDL: 45.2 mg/dL — ABNORMAL HIGH (ref 0.0–40.0)

## 2021-10-09 LAB — LDL CHOLESTEROL, DIRECT: Direct LDL: 154 mg/dL

## 2021-10-10 ENCOUNTER — Other Ambulatory Visit: Payer: Self-pay

## 2021-10-10 ENCOUNTER — Encounter: Payer: Self-pay | Admitting: Internal Medicine

## 2021-10-10 ENCOUNTER — Ambulatory Visit: Payer: Medicare PPO | Admitting: Internal Medicine

## 2021-10-10 DIAGNOSIS — Z8616 Personal history of COVID-19: Secondary | ICD-10-CM | POA: Insufficient documentation

## 2021-10-10 DIAGNOSIS — R296 Repeated falls: Secondary | ICD-10-CM

## 2021-10-10 DIAGNOSIS — E785 Hyperlipidemia, unspecified: Secondary | ICD-10-CM

## 2021-10-10 DIAGNOSIS — I10 Essential (primary) hypertension: Secondary | ICD-10-CM

## 2021-10-10 DIAGNOSIS — E119 Type 2 diabetes mellitus without complications: Secondary | ICD-10-CM | POA: Diagnosis not present

## 2021-10-10 DIAGNOSIS — E559 Vitamin D deficiency, unspecified: Secondary | ICD-10-CM | POA: Diagnosis not present

## 2021-10-10 MED ORDER — LISINOPRIL-HYDROCHLOROTHIAZIDE 10-12.5 MG PO TABS: 1.0000 | ORAL_TABLET | Freq: Every day | ORAL | 3 refills | Status: DC

## 2021-10-10 MED ORDER — ICOSAPENT ETHYL 1 G PO CAPS: 2.0000 g | ORAL_CAPSULE | Freq: Two times a day (BID) | ORAL | 3 refills | Status: DC

## 2021-10-10 NOTE — Assessment & Plan Note (Signed)
Cont on Prinizide

## 2021-10-10 NOTE — Assessment & Plan Note (Signed)
Recovered  

## 2021-10-10 NOTE — Assessment & Plan Note (Signed)
Stable  Not as active post-COVID

## 2021-10-10 NOTE — Assessment & Plan Note (Signed)
No recent falls. better

## 2021-10-10 NOTE — Assessment & Plan Note (Signed)
On Vit D 

## 2021-10-10 NOTE — Assessment & Plan Note (Signed)
Cont on  Vascepa CT coronary calcium score 0. Minimal carotid plaque

## 2021-10-10 NOTE — Progress Notes (Signed)
Subjective:  Patient ID: Lisa [name], female    DOB: 1946-07-26  Age: 76 y.o. MRN: [id number]  CC: Follow-up (3 MONTH F/U)   HPI Lisa [name] presents for DM, dyslipidemia, OA - hands  Outpatient Medications Prior to Visit  Medication Sig Dispense Refill   cholecalciferol (VITAMIN D) 1000 UNITS tablet Take 2,000 Units by mouth every 3 (three) days.     cyanocobalamin 1000 MCG tablet Take 1,000 mcg by mouth daily.      Glucosamine HCl 500 MG TABS Take by mouth 2 (two) times daily.      icosapent Ethyl (VASCEPA) 1 g capsule Take 2 capsules (2 g total) by mouth 2 (two) times daily. 360 capsule 3   lisinopril-hydrochlorothiazide (ZESTORETIC) 10-12.5 MG tablet TAKE 1 TABLET BY MOUTH EVERY DAY 90 tablet 3   metFORMIN (GLUCOPHAGE) 500 MG tablet Take 1 tablet (500 mg total) by mouth daily. 90 tablet 3   Turmeric 500 MG TABS Take 2 capsules by mouth 2 (two) times daily.     benzonatate (TESSALON PERLES) 100 MG capsule 1-2 capsules up to twice daily as needed (Patient not taking: Reported on 08/28/2021) 30 capsule 0   No facility-administered medications prior to visit.    ROS: Review of Systems  Constitutional:  Negative for activity change, appetite change, chills, fatigue and unexpected weight change.  HENT:  Negative for congestion, mouth sores and sinus pressure.   Eyes:  Negative for visual disturbance.  Respiratory:  Negative for cough and chest tightness.   Gastrointestinal:  Negative for abdominal pain and nausea.  Genitourinary:  Negative for difficulty urinating, frequency and vaginal pain.  Musculoskeletal:  Positive for arthralgias and joint swelling. Negative for back pain and gait problem.  Skin:  Negative for pallor and rash.  Neurological:  Negative for dizziness, tremors, weakness, numbness and headaches.  Psychiatric/Behavioral:  Negative for confusion and sleep disturbance.    Objective:  BP 120/78 (BP Location: Left Arm)    Pulse 69    Temp 98.2 F (36.8 C)  (Oral)    Ht 5\' 1"  (1.549 m)    Wt 122 lb 6.4 oz (55.5 kg)    SpO2 96%    BMI 23.13 kg/m   BP Readings from Last 3 Encounters:  10/10/21 120/78  07/06/21 132/78  03/06/21 132/70    Wt Readings from Last 3 Encounters:  10/10/21 122 lb 6.4 oz (55.5 kg)  07/06/21 120 lb 3.2 oz (54.5 kg)  03/06/21 123 lb 12.8 oz (56.2 kg)    Physical Exam Constitutional:      General: She is not in acute distress.    Appearance: She is well-developed.  HENT:     Head: Normocephalic.     Right Ear: External ear normal.     Left Ear: External ear normal.     Nose: Nose normal.  Eyes:     General:        Right eye: No discharge.        Left eye: No discharge.     Conjunctiva/sclera: Conjunctivae normal.     Pupils: Pupils are equal, round, and reactive to light.  Neck:     Thyroid: No thyromegaly.     Vascular: No JVD.     Trachea: No tracheal deviation.  Cardiovascular:     Rate and Rhythm: Normal rate and regular rhythm.     Heart sounds: Normal heart sounds.  Pulmonary:     Effort: No respiratory distress.     Breath sounds:  No stridor. No wheezing.  Abdominal:     General: Bowel sounds are normal. There is no distension.     Palpations: Abdomen is soft. There is no mass.     Tenderness: There is no abdominal tenderness. There is no guarding or rebound.  Musculoskeletal:        General: Tenderness present.     Cervical back: Normal range of motion and neck supple. No rigidity.  Lymphadenopathy:     Cervical: No cervical adenopathy.  Skin:    Findings: No erythema or rash.  Neurological:     Mental Status: She is oriented to person, place, and time.     Cranial Nerves: No cranial nerve deficit.     Motor: No abnormal muscle tone.     Coordination: Coordination normal.     Deep Tendon Reflexes: Reflexes normal.  Psychiatric:        Behavior: Behavior normal.        Thought Content: Thought content normal.        Judgment: Judgment normal.  Hands w/ OA  Lab Results  Component  Value Date   WBC 4.7 07/14/2018   HGB 14.4 07/14/2018   HCT 42.1 07/14/2018   PLT 312.0 07/14/2018   GLUCOSE 93 10/09/2021   CHOL 239 (H) 10/09/2021   TRIG 226.0 (H) 10/09/2021   HDL 51.90 10/09/2021   LDLDIRECT 154.0 10/09/2021   LDLCALC 154 (H) 07/03/2021   ALT 16 10/09/2021   AST 15 10/09/2021   NA 138 10/09/2021   K 3.8 10/09/2021   CL 101 10/09/2021   CREATININE 0.61 10/09/2021   BUN 14 10/09/2021   CO2 29 10/09/2021   TSH 0.65 07/14/2018   HGBA1C 5.8 10/09/2021    MM 3D SCREEN BREAST BILATERAL  Result Date: 02/07/2021 CLINICAL DATA:  Screening. EXAM: DIGITAL SCREENING BILATERAL MAMMOGRAM WITH TOMOSYNTHESIS AND CAD TECHNIQUE: Bilateral screening digital craniocaudal and mediolateral oblique mammograms were obtained. Bilateral screening digital breast tomosynthesis was performed. The images were evaluated with computer-aided detection. COMPARISON:  Previous exam(s). ACR Breast Density Category c: The breast tissue is heterogeneously dense, which Schiraldi obscure small masses. FINDINGS: There are no findings suspicious for malignancy. The images were evaluated with computer-aided detection. IMPRESSION: No mammographic evidence of malignancy. A result letter of this screening mammogram will be mailed directly to the patient. RECOMMENDATION: Screening mammogram in one year. (Code:SM-B-01Y) BI-RADS CATEGORY  1: Negative. Electronically Signed   By: Fidela Salisbury M.D.   On: 02/07/2021 16:54    Assessment & Plan:   Problem List Items Addressed This Visit     Diabetes mellitus type 2 without retinopathy ([place])    Stable  Not as active post-COVID      Dyslipidemia    Cont on  Vascepa CT coronary calcium score 0. Minimal carotid plaque      Frequent falls    No recent falls. better      History of COVID-19    Recovered      Vitamin D deficiency    On Vit D         No orders of the defined types were placed in this encounter.     Follow-up: Return for a  follow-up visit.  Walker Kehr, MD

## 2021-10-12 DIAGNOSIS — H524 Presbyopia: Secondary | ICD-10-CM | POA: Diagnosis not present

## 2021-10-12 DIAGNOSIS — H52203 Unspecified astigmatism, bilateral: Secondary | ICD-10-CM | POA: Diagnosis not present

## 2021-10-12 DIAGNOSIS — H5203 Hypermetropia, bilateral: Secondary | ICD-10-CM | POA: Diagnosis not present

## 2021-10-12 LAB — HM DIABETES EYE EXAM

## 2021-11-23 DIAGNOSIS — Z961 Presence of intraocular lens: Secondary | ICD-10-CM | POA: Diagnosis not present

## 2021-11-23 DIAGNOSIS — H35343 Macular cyst, hole, or pseudohole, bilateral: Secondary | ICD-10-CM | POA: Diagnosis not present

## 2021-11-23 DIAGNOSIS — H26493 Other secondary cataract, bilateral: Secondary | ICD-10-CM | POA: Diagnosis not present

## 2021-11-23 DIAGNOSIS — H26492 Other secondary cataract, left eye: Secondary | ICD-10-CM | POA: Diagnosis not present

## 2021-11-23 DIAGNOSIS — E119 Type 2 diabetes mellitus without complications: Secondary | ICD-10-CM | POA: Diagnosis not present

## 2021-11-23 DIAGNOSIS — H26491 Other secondary cataract, right eye: Secondary | ICD-10-CM | POA: Diagnosis not present

## 2021-12-20 IMAGING — CT CT CARDIAC CORONARY ARTERY CALCIUM SCORE
3 series · 14 of 20 positions shown, 15 images · non-contrast
Comparison: None.
COMPARISON: None.

Addendum:
EXAM:
OVER-READ INTERPRETATION  CT CHEST

The following report is an over-read performed by radiologist Dr.
Audriana Riviere [REDACTED] on 11/03/2019. This over-read
does not include interpretation of cardiac or coronary anatomy or
pathology. The coronary calcium score interpretation by the
cardiologist is attached.
CLINICAL DATA: Risk stratification
Coronary Calcium Score
TECHNIQUE: The patient was scanned on a Siemens Force scanner. Axial
non-contrast 3 mm slices were carried out through the heart. The
data set was analyzed on a dedicated work station and scored using
the Agatson method.

[Series 2: casc 3.0 bv41 2 bestdiast 74 % · axial · 0.31mm/px · z∈[-242,-176]mm · 4 of 38 slices shown, 5 images]
[im 8/38  vessel]
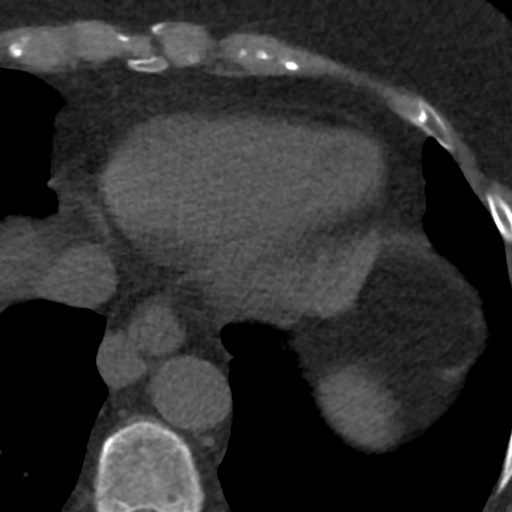
[im 8/38  lung]
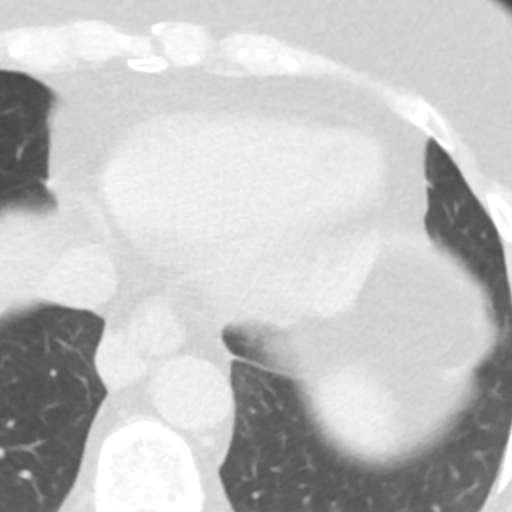
[im 15/38  vessel]
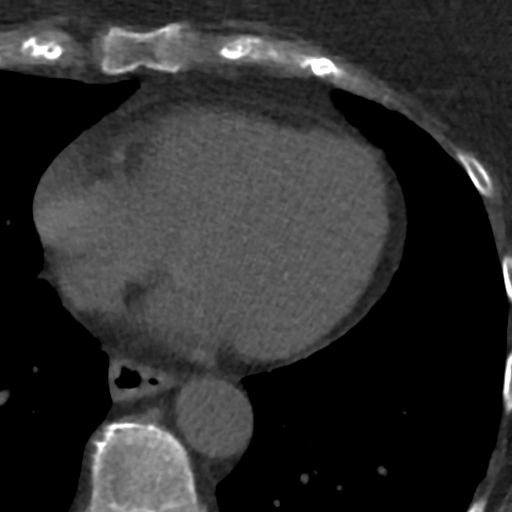
[im 23/38  vessel]
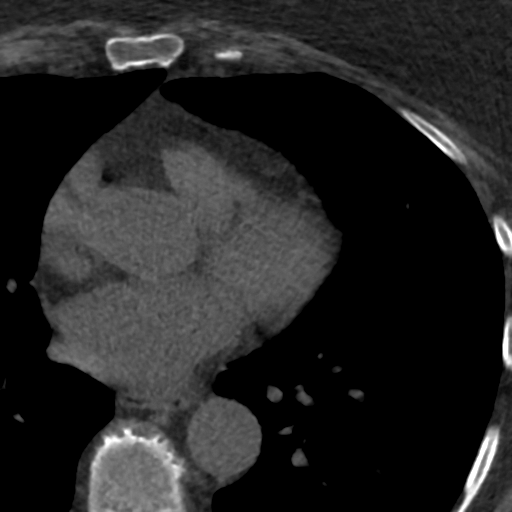
[im 30/38  vessel]
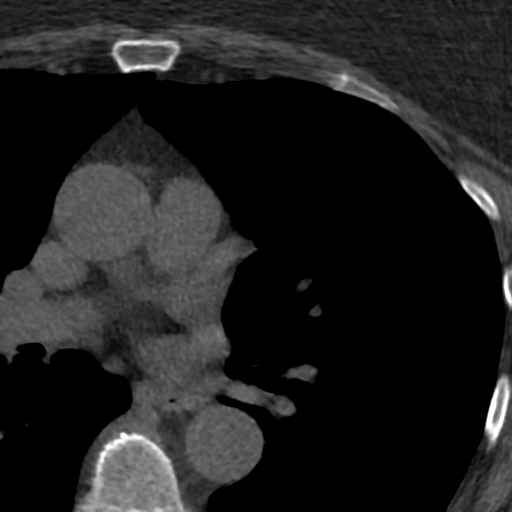

[Series 3: lung 74 % · axial · 0.64mm/px · z∈[-248,-173]mm · 5 of 39 slices shown]
[im 7/39  lung]
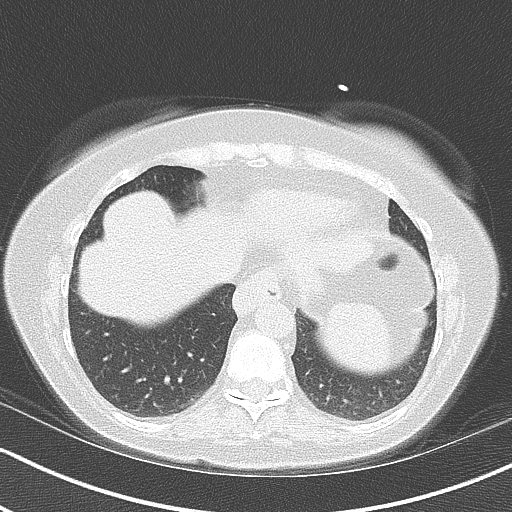
[im 13/39  lung]
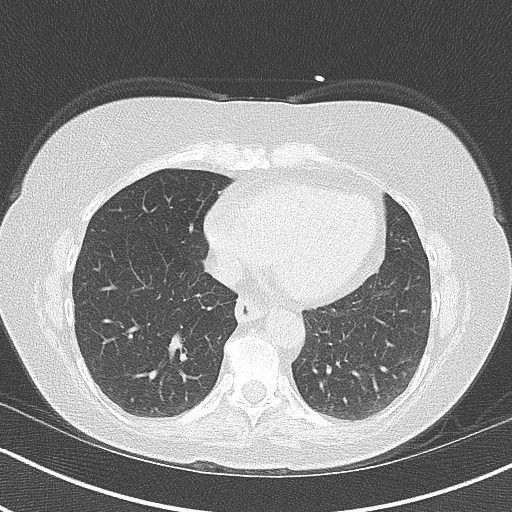
[im 20/39  lung]
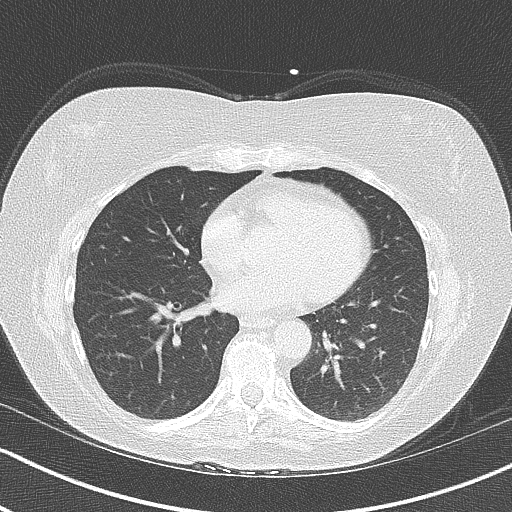
[im 26/39  lung]
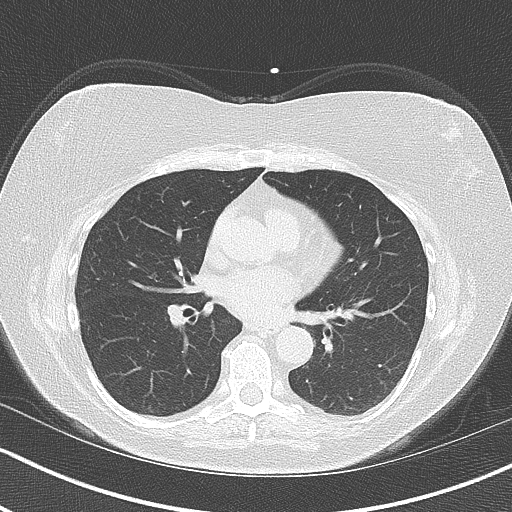
[im 32/39  lung]
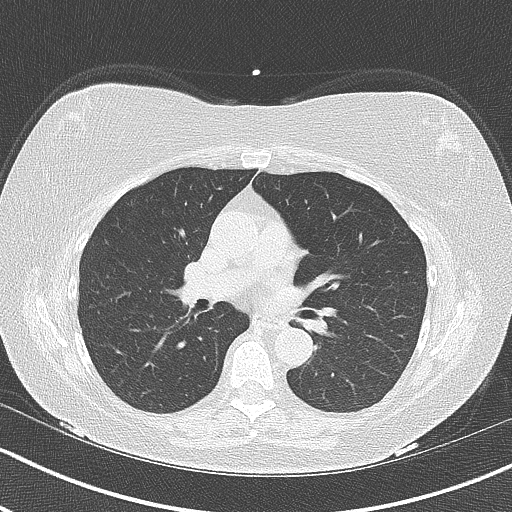

[Series 4: lung st 74 % · axial · 0.64mm/px · z∈[-248,-173]mm · 5 of 39 slices shown]
[im 7/39  lung]
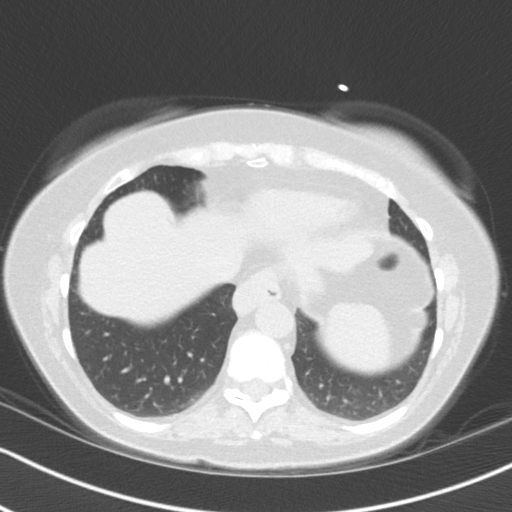
[im 13/39  lung]
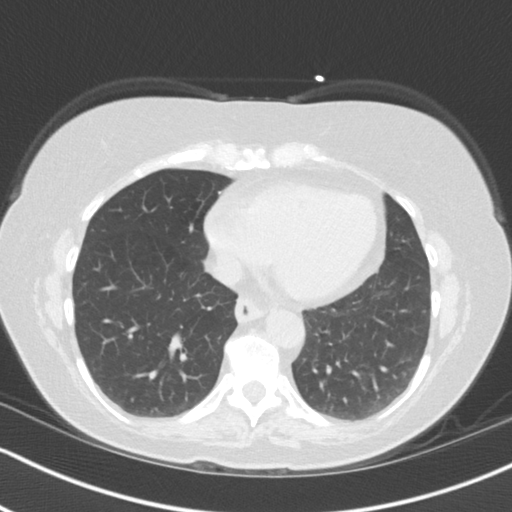
[im 20/39  lung]
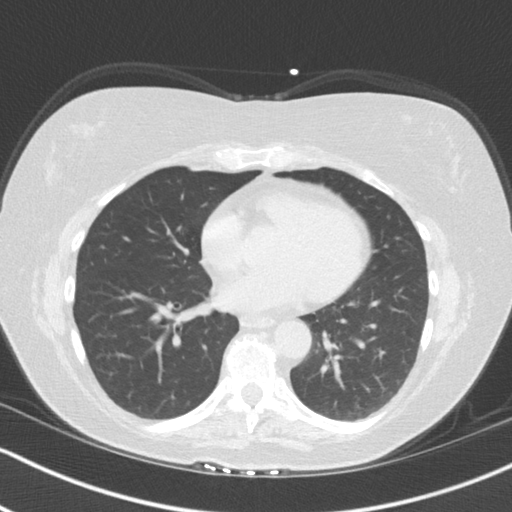
[im 26/39  lung]
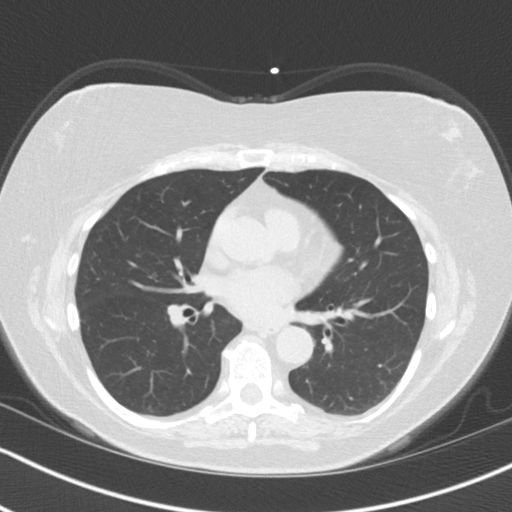
[im 32/39  lung]
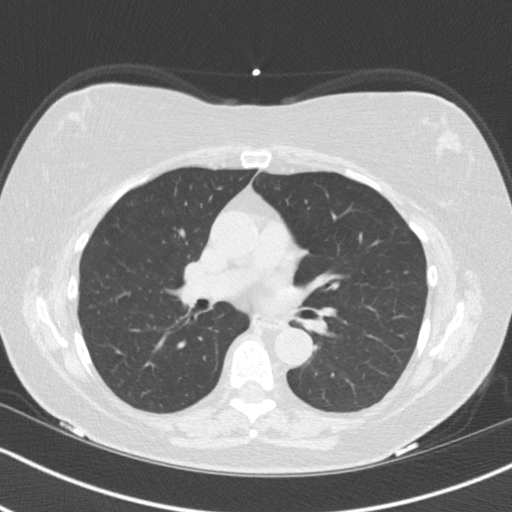

[14 of 20 positions shown; findings below may reference images not displayed]

FINDINGS: Vascular: Heart is normal size.  Aorta is normal caliber.

Mediastinum/Nodes: No adenopathy in the lower mediastinum or hila.
Small hiatal hernia.

Lungs/Pleura: No confluent opacities or effusions.

Upper Abdomen: Imaging into the upper abdomen shows no acute
findings.

Musculoskeletal: Chest wall soft tissues are unremarkable. No acute
bony abnormality.
IMPRESSION: Small hiatal hernia.  No acute extra cardiac abnormality.
FINDINGS: Non-cardiac: See separate report from [REDACTED].

Ascending Aorta: Normal

Pericardium: Normal

Coronary arteries: Normal origin.
IMPRESSION: Coronary calcium score of 0. This was percentile 0 for age and sex
matched control.

Anandjay Brochado,[place]

*** End of Addendum ***
EXAM:
OVER-READ INTERPRETATION  CT CHEST

The following report is an over-read performed by radiologist Dr.
Audriana Riviere [REDACTED] on 11/03/2019. This over-read
does not include interpretation of cardiac or coronary anatomy or
pathology. The coronary calcium score interpretation by the
cardiologist is attached.
FINDINGS: Vascular: Heart is normal size.  Aorta is normal caliber.

Mediastinum/Nodes: No adenopathy in the lower mediastinum or hila.
Small hiatal hernia.

Lungs/Pleura: No confluent opacities or effusions.

Upper Abdomen: Imaging into the upper abdomen shows no acute
findings.

Musculoskeletal: Chest wall soft tissues are unremarkable. No acute
bony abnormality.
IMPRESSION: Small hiatal hernia.  No acute extra cardiac abnormality.

## 2021-12-25 ENCOUNTER — Other Ambulatory Visit: Payer: Self-pay | Admitting: Internal Medicine

## 2021-12-25 DIAGNOSIS — Z1231 Encounter for screening mammogram for malignant neoplasm of breast: Secondary | ICD-10-CM

## 2022-01-01 ENCOUNTER — Ambulatory Visit (INDEPENDENT_AMBULATORY_CARE_PROVIDER_SITE_OTHER): Payer: Medicare PPO | Admitting: *Deleted

## 2022-01-01 DIAGNOSIS — E119 Type 2 diabetes mellitus without complications: Secondary | ICD-10-CM

## 2022-01-01 DIAGNOSIS — E785 Hyperlipidemia, unspecified: Secondary | ICD-10-CM

## 2022-01-01 NOTE — Patient Instructions (Addendum)
Visit Information ? ?Lisa [name], thank you for taking time to talk with me today. Please don't hesitate to contact me if I can be of assistance to you before our next scheduled telephone appointment ? ?Below are the goals we discussed today:  ?Patient Self-Care Activities: ?[name] will: ?Take medications as prescribed ?Attend all scheduled provider appointments ?Call pharmacy for medication refills ?Call provider office for new concerns or questions ?Continue to follow carb modified, low sugar, heart healthy diet ?Continue to participate in regular structured activity and exercise as much as possible around weather conditions ?Continue to monitor fasting blood sugars several times weekly ?Continue to follow efforts to prevent falls ? ?Our next scheduled telephone follow up visit/ appointment is scheduled on:   Monday, July 09, 2022 at 9:00 am- This is a PHONE [place] appointment ? ?If you need to cancel or re-schedule our visit, please call [phone] and our care guide team will be happy to assist you. ?  ?I look forward to hearing about your progress. ?  ?Lisa Rack, RN, BSN, CCRN Alumnus ?[place] ?[phone]: direct office ? ?If you are experiencing a Mental Health or [place] or need someone to talk to, please  ?call the Suicide and Crisis Lifeline: 988 ?call the [place] National Suicide Prevention Lifeline: (705) 192-8248 or TTY: (413) 146-9423 TTY [phone]) to talk to a trained counselor ?call 1-800-273-TALK (toll free, 24 hour hotline) ?go to [facility] [address], [place] [phone]) ?call 911  ? ?Patient verbalizes understanding of instructions and care plan provided today and agrees to view in [place]. Active MyChart status confirmed with patient ? ?Diabetes Mellitus and Standards of Medical Care ?Living with and managing diabetes (diabetes mellitus) can be complicated. Your  diabetes treatment Baranowski be managed by a team of health care providers, including: ?A physician who specializes in diabetes (endocrinologist). You might also have visits with a nurse practitioner or physician assistant. ?Nurses. ?A registered dietitian. ?A certified diabetes care and education specialist. ?An exercise specialist. ?A pharmacist. ?An eye doctor. ?A foot specialist (podiatrist). ?A dental care provider. ?A primary care provider. ?A mental health care provider. ?How to manage your diabetes ?You can do many things to successfully manage your diabetes. Your health care providers will follow guidelines to help you get the best quality of care. Here are general guidelines for your diabetes management plan. Your health care providers Bran give you more specific instructions. ?Physical exams ?When you are diagnosed with diabetes, and each year after that, your health care provider will ask about your medical and family history. You will have a physical exam, which Oros include: ?Measuring your height, weight, and body mass index (BMI). ?Checking your blood pressure. This will be done at every routine medical visit. Your target blood pressure Gambrill vary depending on your medical conditions, your age, and other factors. ?A thyroid exam. ?A skin exam. ?Screening for nerve damage (peripheral neuropathy). This [place]on include checking the pulse in your legs and feet and the level of sensation in your hands and feet. ?A foot exam to inspect the structure and skin of your feet, including checking for cuts, bruises, redness, blisters, sores, or other problems. ?Screening for blood vessel (vascular) problems. This Verdone include checking the pulse in your legs and feet and checking your temperature. ?Blood tests ?Depending on your treatment plan and your personal needs, you Pickering have the following tests: ?Hemoglobin A1C (HbA1C). This test provides information about blood  sugar (glucose) control over the previous 2-3 months. It is  used to adjust your treatment plan, if needed. This test will be done: ?At least 2 times a year, if you are meeting your treatment goals. ?4 times a year, if you are not meeting your treatment goals or if your goals have changed. ?Lipid testing, including total cholesterol, LDL and HDL cholesterol, and triglyceride levels. ?The goal for LDL is less than 100 mg/dL (5.5 mmol/L). If you are at high risk for complications, the goal is less than 70 mg/dL (3.9 mmol/L). ?The goal for HDL is 40 mg/dL (2.2 mmol/L) or higher for men, and 50 mg/dL (2.8 mmol/L) or higher for women. An HDL cholesterol of 60 mg/dL (3.3 mmol/L) or higher gives some protection against heart disease. ?The goal for triglycerides is less than 150 mg/dL (8.3 mmol/L). ?Liver function tests. ?Kidney function tests. ?Thyroid function tests. ? ?Dental and eye exams ? ?Visit your dentist two times a year. ?If you have type 1 diabetes, your health care provider Seivert recommend an eye exam within 5 years after you are diagnosed, and then once a year after your first exam. ?For children with type 1 diabetes, the health care provider Albano recommend an eye exam when your child is age 34 or older and has had diabetes for 3-5 years. After the first exam, your child should get an eye exam once a year. ?If you have type 2 diabetes, your health care provider Kruk recommend an eye exam as soon as you are diagnosed, and then every 1-2 years after your first exam. ?Immunizations ?A yearly flu (influenza) vaccine is recommended annually for everyone 6 months or older. This is especially important if you have diabetes. ?The pneumonia (pneumococcal) vaccine is recommended for everyone 2 years or older who has diabetes. If you are age 6 or older, you Santor get the pneumonia vaccine as a series of two separate shots. ?The hepatitis B vaccine is recommended for adults shortly after being diagnosed with diabetes. ?Adults and children with diabetes should receive all other vaccines  according to age-specific recommendations from the Centers for Disease Control and Prevention (CDC). ?Mental and emotional health ?Screening for symptoms of eating disorders, anxiety, and depression is recommended at the time of diagnosis and after as needed. If your screening shows that you have symptoms, you Rettinger need more evaluation. You Touchette work with a mental health care provider. ?Follow these instructions at home: ?Treatment plan ?You will monitor your blood glucose levels and Garis give yourself insulin. Your treatment plan will be reviewed at every medical visit. You and your health care provider will discuss: ?How you are taking your medicines, including insulin. ?Any side effects you have. ?Your blood glucose level target goals. ?How often you monitor your blood glucose level. ?Lifestyle habits, such as activity level and tobacco, alcohol, and substance use. ?Education ?Your health care provider will assess how well you are monitoring your blood glucose levels and whether you are taking your insulin and medicines correctly. He or she Kleine refer you to: ?A certified diabetes care and education specialist to manage your diabetes throughout your life, starting at diagnosis. ?A registered dietitian who can create and review your personal nutrition plan. ?An exercise specialist who can discuss your activity level and exercise plan. ?General instructions ?Take over-the-counter and prescription medicines only as told by your health care provider. ?Keep all follow-up visits. This is important. ?Where to find support ?There are many diabetes support networks, including: ?American Diabetes Association (ADA):  diabetes.org ?Defeat Diabetes Foundation: defeatdiabetes.org ?Where to find more information ?American Diabetes Association (ADA): www.diabetes.org ?Association of Diabetes Care & Education Specialists (ADCES): diabeteseducator.org ?International Diabetes Federation (IDF): https://www.munoz-bell.org/ ?Summary ?Managing diabetes  (diabetes mellitus) can be complicated. Your diabetes treatment Lardizabal be managed by a team of health care providers. ?Your health care providers follow guidelines to help you get the best quality care. ?You should ha

## 2022-01-01 NOTE — Chronic Care Management (AMB) (Addendum)
?Chronic Care Management  ? ?CCM RN Visit Note ? ?01/01/2022 ?Name: Lisa [name] MRN: [id number] DOB: 23-Feb-1946 ? ?Subjective: ?Lisa [name] is a 76 y.o. year old female who is a primary care patient of [name], Lisa Lacks, MD. The care management team was consulted for assistance with disease management and care coordination needs.   ? ?Engaged with patient by telephone for follow up visit in response to provider referral for case management and/or care coordination services.  ? ?Consent to Services:  ?The patient was given information about Chronic Care Management services, agreed to services, and gave verbal consent prior to initiation of services.  Please see initial visit note for detailed documentation.  ?Patient agreed to services and verbal consent obtained.  ? ?Assessment: Review of patient past medical history, allergies, medications, health status, including review of consultants reports, laboratory and other test data, was performed as part of comprehensive evaluation and provision of chronic care management services.  ? ?SDOH (Social Determinants of Health) assessments and interventions performed:  ?SDOH Interventions   ? ?Flowsheet Row Most Recent Value  ?SDOH Interventions   ?Food Insecurity Interventions Intervention Not Indicated  [continues to deny food insecurity]  ?Housing Interventions Intervention Not Indicated  [continues to reside alone]  ?Transportation Interventions Intervention Not Indicated  [continues to drive self]  ? ?  ?CCM Care Plan ? ?Allergies  ?Allergen Reactions  ? Atorvastatin   ?  REACTION: achy  ? Ezetimibe-Simvastatin   ?  REACTION: myalgia  ? Fish Oil   ?  REACTION: bruising  ? Livalo [Pitavastatin Calcium]   ?  pain  ? Lovastatin   ?  REACTION: myalgias  ? Rosuvastatin   ?  REACTION: myalgia  ? Zetia [Ezetimibe]   ?  Side effects  ? ?Outpatient Encounter Medications as of 01/01/2022  ?Medication Sig Note  ? cholecalciferol (VITAMIN D) 1000 UNITS tablet Take 2,000 Units  by mouth every 3 (three) days.   ? cyanocobalamin 1000 MCG tablet Take 1,000 mcg by mouth daily.    ? Glucosamine HCl 500 MG TABS Take by mouth 2 (two) times daily.    ? icosapent Ethyl (VASCEPA) 1 g capsule Take 2 capsules (2 g total) by mouth 2 (two) times daily.   ? lisinopril-hydrochlorothiazide (ZESTORETIC) 10-12.5 MG tablet Take 1 tablet by mouth daily.   ? metFORMIN (GLUCOPHAGE) 500 MG tablet Take 1 tablet (500 mg total) by mouth daily. (Patient not taking: Reported on 01/01/2022) 01/01/2022: 01/01/22: Reports stopped taking due to muscle aches  ? Turmeric 500 MG TABS Take 2 capsules by mouth 2 (two) times daily.   ? ?No facility-administered encounter medications on file as of 01/01/2022.  ? ?Patient Active Problem List  ? Diagnosis Date Noted  ? History of COVID-19 10/10/2021  ? Head contusion 07/06/2021  ? Piriformis muscle pain 07/07/2020  ? Right ankle pain 07/07/2020  ? Dysuria 08/24/2019  ? Low back pain 08/24/2019  ? Laceration of skin of right knee 07/12/2019  ? Bruit of right carotid artery 07/16/2018  ? Abdominal pain 01/06/2018  ? Right knee pain 12/21/2016  ? Hip flexor tightness 11/30/2015  ? Piriformis syndrome of left side 04/20/2014  ? IT band syndrome 04/20/2014  ? Frequent falls 04/20/2014  ? Diabetes mellitus type 2 without retinopathy ([place]) 02/19/2013  ? Well adult exam 07/13/2012  ? Left lumbar radiculopathy 04/08/2012  ? Macular hole 02/28/2012  ? Status post intraocular lens implant 02/28/2012  ? Vitamin D deficiency 04/30/2008  ? Dyslipidemia 10/31/2007  ?  RECENT RETINAL DETACHMENT PARTIAL W/GIANT TEAR 10/31/2007  ? Essential hypertension 10/31/2007  ? ?Conditions to be addressed/monitored:  HLD and DMII ? ?Care Plan : RN Care Manager Plan of Care  ?Updates made by Knox Royalty, RN since 01/01/2022 12:00 AM  ?  ? ?Problem: Chronic Disease Management Needs   ?Priority: Low  ?  ? ?Long-Range Goal: Ongoing adherence to established plan of care for long term chronic disease management    ?Start Date: 04/25/2021  ?Expected End Date: 04/25/2022  ?Priority: Low  ?Note:   ?Current Barriers:  ?Chronic Disease Management support and education needs related to HLD and DMII ? ?RNCM Clinical Goal(s):  ?Patient will demonstrate ongoing health management independence DM; HTN; HLD  through collaboration with RN Care manager, provider, and care team.  ? ?Interventions: ?1:1 collaboration with primary care provider regarding development and update of comprehensive plan of care as evidenced by provider attestation and co-signature ?Inter-disciplinary care team collaboration (see longitudinal plan of care) ?Evaluation of current treatment plan related to  self management and patient's adherence to plan as established by provider; ?Review of patient status, including review of consultants reports, relevant laboratory and other test results, and medications completed ?SDOH updated: no new/ unmet concerns identified ?Depression screening updated: no concerns identified ?Pain assessment updated: denies pain today, but reports recent episodic muscle aches/ pain that is now resolved- states she researched her medications and stopped taking metformin which resolved the muscle aches; she plans to discuss her decision with PCP at time of next scheduled PCP appointment in [place] ?Falls assessment updated: she continues to deny new/ recent falls x 12 months- not currently using assistive devices;  positive reinforcement provided with encouragement to continue efforts at fall prevention; previously provided education around fall risks/ prevention reinforced ?Reviewed recent PCP office visit 10/10/21: patient verbalizes a good understanding of lab results; plan of care; denies questions post- recent PCP office visit; confirms no medication changes made at time of PCP office visit ?Medications discussed:  reports continues to independently self-manage and denies current concerns/ issues/ questions around medications; endorses adherence  to taking all medications as prescribed, aside from independent decision to stop metformin, as above ?Reviewed upcoming scheduled provider appointments: 02/07/22- PCP; 02/13/22- GI; patient confirms is aware of all and has plans to attend as scheduled ?Discussed plans with patient for ongoing care management follow up and provided patient with direct contact information for care management team    ? ?Diabetes:  (Status: 01/01/22: Goal on Track (progressing): YES.) Long Term Goal ?Lab Results  ?Component Value Date  ? HGBA1C 5.8 10/09/2021  ?   ?Reviewed prescribed diet with patient carb modified, low sugar heart healthy; confirmed patient continues following recommended diet ?Counseled on importance of regular laboratory monitoring as prescribed;   reviewed most recent A1-C test value result from 10/09/21     ?Advised patient, providing education and rationale, to continue monitoring/ recording fasting blood sugars several times each week   ?Reviewed recent fasting blood sugars at home: she reports consistent ranges between 105-110      ?Assessed patient's understanding of meaning/ significance of A1-C values: very good baseline understanding of same- she is a retired [employer] scientist-- reviewed individual historical A1-C trends and discussed correlation of A1-C value to blood sugar levels at home over 3 months ?Confirmed patient has recently attended annual eye/ vision exam; positive reinforcement provided with encouragement to continue efforts at routine screening for DMII ?Confirmed patient participates in regular structured activity/ exercise as much as  possible around weather conditions: she has not been as active lately as she normally is- but verbalizes plans to resume activity soon ? ?Hyperlipidemia:  (Status: 01/01/22: Goal on Track (progressing): YES.) Long Term Goal ?Lab Results  ?Component Value Date  ? CHOL 239 (H) 10/09/2021  ? HDL 51.90 10/09/2021  ? LDLCALC 154 (H) 07/03/2021  ? LDLDIRECT 154.0  10/09/2021  ? TRIG 226.0 (H) 10/09/2021  ? CHOLHDL 5 10/09/2021  ?Counseled on importance of regular laboratory monitoring as prescribed; ?Reviewed importance of limiting foods high in cholesterol; ?Reviewed exerc

## 2022-01-14 DIAGNOSIS — E119 Type 2 diabetes mellitus without complications: Secondary | ICD-10-CM

## 2022-01-14 DIAGNOSIS — E785 Hyperlipidemia, unspecified: Secondary | ICD-10-CM | POA: Diagnosis not present

## 2022-02-05 ENCOUNTER — Other Ambulatory Visit (INDEPENDENT_AMBULATORY_CARE_PROVIDER_SITE_OTHER): Payer: Medicare PPO

## 2022-02-05 DIAGNOSIS — E785 Hyperlipidemia, unspecified: Secondary | ICD-10-CM | POA: Diagnosis not present

## 2022-02-05 DIAGNOSIS — E119 Type 2 diabetes mellitus without complications: Secondary | ICD-10-CM | POA: Diagnosis not present

## 2022-02-05 DIAGNOSIS — I10 Essential (primary) hypertension: Secondary | ICD-10-CM

## 2022-02-05 LAB — BASIC METABOLIC PANEL
BUN: 14 mg/dL (ref 6–23)
CO2: 29 mEq/L (ref 19–32)
Calcium: 9.3 mg/dL (ref 8.4–10.5)
Chloride: 101 mEq/L (ref 96–112)
Creatinine, Ser: 0.61 mg/dL (ref 0.40–1.20)
GFR: 87.09 mL/min (ref >60.00)
Glucose, Bld: 95 mg/dL (ref 70–99)
Potassium: 4.1 mEq/L (ref 3.5–5.1)
Sodium: 136 mEq/L (ref 135–145)

## 2022-02-05 LAB — LIPID PANEL
Cholesterol: 239 mg/dL — ABNORMAL HIGH (ref 0–200)
HDL: 55.8 mg/dL (ref >39.00)
LDL Cholesterol: 151 mg/dL — ABNORMAL HIGH (ref 0–99)
NonHDL: 182.74
Total CHOL/HDL Ratio: 4
Triglycerides: 157 mg/dL — ABNORMAL HIGH (ref 0.0–149.0)
VLDL: 31.4 mg/dL (ref 0.0–40.0)

## 2022-02-05 LAB — HEMOGLOBIN A1C: Hgb A1c MFr Bld: 5.8 % (ref 4.6–6.5)

## 2022-02-07 ENCOUNTER — Encounter: Payer: Self-pay | Admitting: Internal Medicine

## 2022-02-07 ENCOUNTER — Ambulatory Visit: Payer: Medicare PPO | Admitting: Internal Medicine

## 2022-02-07 VITALS — BP 124/76 | HR 76 | Ht 61.0 in | Wt 124.6 lb

## 2022-02-07 DIAGNOSIS — M25511 Pain in right shoulder: Secondary | ICD-10-CM

## 2022-02-07 DIAGNOSIS — I10 Essential (primary) hypertension: Secondary | ICD-10-CM

## 2022-02-07 DIAGNOSIS — Z0000 Encounter for general adult medical examination without abnormal findings: Secondary | ICD-10-CM

## 2022-02-07 DIAGNOSIS — M25512 Pain in left shoulder: Secondary | ICD-10-CM | POA: Diagnosis not present

## 2022-02-07 DIAGNOSIS — E119 Type 2 diabetes mellitus without complications: Secondary | ICD-10-CM

## 2022-02-07 DIAGNOSIS — E785 Hyperlipidemia, unspecified: Secondary | ICD-10-CM | POA: Diagnosis not present

## 2022-02-07 NOTE — Progress Notes (Signed)
Subjective:  Patient ID: Lisa [name], female    DOB: 1946-03-01  Age: 75 y.o. MRN: [id number]  CC: Follow-up   HPI Lisa [name] presents for HTN, dyslipidemia C/o B shoulder pain - worse 7-8/10. No weakness. Ibuprofen helped  Outpatient Medications Prior to Visit  Medication Sig Dispense Refill   cholecalciferol (VITAMIN D) 1000 UNITS tablet Take 2,000 Units by mouth every 3 (three) days.     cyanocobalamin 1000 MCG tablet Take 1,000 mcg by mouth daily.      Glucosamine HCl 500 MG TABS Take by mouth 2 (two) times daily.      icosapent Ethyl (VASCEPA) 1 g capsule Take 2 capsules (2 g total) by mouth 2 (two) times daily. 360 capsule 3   lisinopril-hydrochlorothiazide (ZESTORETIC) 10-12.5 MG tablet Take 1 tablet by mouth daily. 90 tablet 3   metFORMIN (GLUCOPHAGE) 500 MG tablet Take 1 tablet (500 mg total) by mouth daily. 90 tablet 3   Turmeric 500 MG TABS Take 2 capsules by mouth 2 (two) times daily.     No facility-administered medications prior to visit.    ROS: Review of Systems  Constitutional:  Negative for activity change, appetite change, chills, fatigue and unexpected weight change.  HENT:  Negative for congestion, mouth sores and sinus pressure.   Eyes:  Negative for visual disturbance.  Respiratory:  Negative for cough and chest tightness.   Gastrointestinal:  Negative for abdominal pain and nausea.  Genitourinary:  Negative for difficulty urinating, frequency and vaginal pain.  Musculoskeletal:  Negative for back pain and gait problem.  Skin:  Negative for pallor and rash.  Neurological:  Negative for dizziness, tremors, weakness, numbness and headaches.  Psychiatric/Behavioral:  Negative for confusion and sleep disturbance.    Objective:  BP 124/76   Pulse 76   Ht '5\' 1"'$  (1.549 m)   Wt 124 lb 9.6 oz (56.5 kg)   SpO2 97%   BMI 23.54 kg/m   BP Readings from Last 3 Encounters:  02/07/22 124/76  10/10/21 120/78  07/06/21 132/78    Wt Readings from Last 3  Encounters:  02/07/22 124 lb 9.6 oz (56.5 kg)  10/10/21 122 lb 6.4 oz (55.5 kg)  07/06/21 120 lb 3.2 oz (54.5 kg)    Physical Exam Constitutional:      General: She is not in acute distress.    Appearance: Normal appearance. She is well-developed.  HENT:     Head: Normocephalic.     Right Ear: External ear normal.     Left Ear: External ear normal.     Nose: Nose normal.  Eyes:     General:        Right eye: No discharge.        Left eye: No discharge.     Conjunctiva/sclera: Conjunctivae normal.     Pupils: Pupils are equal, round, and reactive to light.  Neck:     Thyroid: No thyromegaly.     Vascular: No JVD.     Trachea: No tracheal deviation.  Cardiovascular:     Rate and Rhythm: Normal rate and regular rhythm.     Heart sounds: Normal heart sounds.  Pulmonary:     Effort: No respiratory distress.     Breath sounds: No stridor. No wheezing.  Abdominal:     General: Bowel sounds are normal. There is no distension.     Palpations: Abdomen is soft. There is no mass.     Tenderness: There is no abdominal tenderness. There is  no guarding or rebound.  Musculoskeletal:        General: No tenderness.     Cervical back: Normal range of motion and neck supple. No rigidity.  Lymphadenopathy:     Cervical: No cervical adenopathy.  Skin:    Findings: No erythema or rash.  Neurological:     Cranial Nerves: No cranial nerve deficit.     Motor: No abnormal muscle tone.     Coordination: Coordination normal.     Deep Tendon Reflexes: Reflexes normal.  Psychiatric:        Behavior: Behavior normal.        Thought Content: Thought content normal.        Judgment: Judgment normal.    Lab Results  Component Value Date   WBC 4.7 07/14/2018   HGB 14.4 07/14/2018   HCT 42.1 07/14/2018   PLT 312.0 07/14/2018   GLUCOSE 95 02/05/2022   CHOL 239 (H) 02/05/2022   TRIG 157.0 (H) 02/05/2022   HDL 55.80 02/05/2022   LDLDIRECT 154.0 10/09/2021   LDLCALC 151 (H) 02/05/2022   ALT  16 10/09/2021   AST 15 10/09/2021   NA 136 02/05/2022   K 4.1 02/05/2022   CL 101 02/05/2022   CREATININE 0.61 02/05/2022   BUN 14 02/05/2022   CO2 29 02/05/2022   TSH 0.65 07/14/2018   HGBA1C 5.8 02/05/2022    MM 3D SCREEN BREAST BILATERAL  Result Date: 02/07/2021 CLINICAL DATA:  Screening. EXAM: DIGITAL SCREENING BILATERAL MAMMOGRAM WITH TOMOSYNTHESIS AND CAD TECHNIQUE: Bilateral screening digital craniocaudal and mediolateral oblique mammograms were obtained. Bilateral screening digital breast tomosynthesis was performed. The images were evaluated with computer-aided detection. COMPARISON:  Previous exam(s). ACR Breast Density Category c: The breast tissue is heterogeneously dense, which [place] obscure small masses. FINDINGS: There are no findings suspicious for malignancy. The images were evaluated with computer-aided detection. IMPRESSION: No mammographic evidence of malignancy. A result letter of this screening mammogram will be mailed directly to the patient. RECOMMENDATION: Screening mammogram in one year. (Code:SM-B-01Y) BI-RADS CATEGORY  1: Negative. Electronically Signed   By: Fidela Salisbury M.D.   On: 02/07/2021 16:54    Assessment & Plan:   Problem List Items Addressed This Visit     Well adult exam - Primary   Relevant Orders   TSH   CBC with Differential/Platelet   Urinalysis   Lipid panel   Comprehensive metabolic panel   Dyslipidemia   Relevant Orders   TSH   Lipid panel   Essential hypertension    Cont on Prinizide      Relevant Orders   TSH   CBC with Differential/Platelet   Urinalysis   Lipid panel   Comprehensive metabolic panel   Diabetes mellitus type 2 without retinopathy ([place])    On Metformin       Relevant Orders   Hemoglobin A1c   Shoulder pain, bilateral    Post-Covid? Work-up was offered - declined labs, X rays Blue-Emu cream was recommended to use 2-3 times a day Pt declined NSAIDs           No orders of the defined types  were placed in this encounter.     Follow-up: Return in about 6 months (around 08/10/2022) for Wellness Exam.  Walker Kehr, MD

## 2022-02-07 NOTE — Assessment & Plan Note (Signed)
Post-Covid? Work-up was offered - declined labs, X rays Blue-Emu cream was recommended to use 2-3 times a day Pt declined NSAIDs

## 2022-02-07 NOTE — Assessment & Plan Note (Signed)
Cont on Prinizide

## 2022-02-07 NOTE — Assessment & Plan Note (Signed)
On Metformin 

## 2022-02-13 ENCOUNTER — Ambulatory Visit
Admission: RE | Admit: 2022-02-13 | Discharge: 2022-02-13 | Disposition: A | Discharge status: Home / Self Care | Payer: Medicare PPO | Source: Ambulatory Visit | Attending: Internal Medicine | Admitting: Internal Medicine

## 2022-02-13 DIAGNOSIS — Z1231 Encounter for screening mammogram for malignant neoplasm of breast: Secondary | ICD-10-CM | POA: Diagnosis not present

## 2022-03-27 ENCOUNTER — Ambulatory Visit (INDEPENDENT_AMBULATORY_CARE_PROVIDER_SITE_OTHER): Payer: Medicare PPO | Admitting: *Deleted

## 2022-03-27 DIAGNOSIS — E119 Type 2 diabetes mellitus without complications: Secondary | ICD-10-CM

## 2022-03-27 DIAGNOSIS — I10 Essential (primary) hypertension: Secondary | ICD-10-CM

## 2022-03-27 DIAGNOSIS — E785 Hyperlipidemia, unspecified: Secondary | ICD-10-CM

## 2022-03-27 NOTE — Chronic Care Management (AMB) (Addendum)
Chronic Care Management   CCM RN Visit Note  03/27/2022 Name: Kathye Cipriani Held MRN: [id number] DOB: 04-02-1946  Subjective: Lisa [name] is a 76 y.o. year old female who is a primary care patient of [name], [name], MD. The care management team was consulted for assistance with disease management and care coordination needs.    Engaged with patient by telephone for follow up visit/ CCM RN CM case closure in response to provider referral for case management and/or care coordination services.   Consent to Services:  The patient was given information about Chronic Care Management services, agreed to services, and gave verbal consent prior to initiation of services.  Please see initial visit note for detailed documentation.  Patient agreed to services and verbal consent obtained.   Assessment: Review of patient past medical history, allergies, medications, health status, including review of consultants reports, laboratory and other test data, was performed as part of comprehensive evaluation and provision of chronic care management services.   SDOH (Social Determinants of Health) assessments and interventions performed:  SDOH Interventions    Flowsheet Row Most Recent Value  SDOH Interventions   Food Insecurity Interventions Intervention Not Indicated  [continuens to deny food insecurity]  Transportation Interventions Intervention Not Indicated  [continues to drive self]        CCM Care Plan  Allergies  Allergen Reactions   Atorvastatin     REACTION: achy   Ezetimibe-Simvastatin     REACTION: myalgia   Fish Oil     REACTION: bruising   Livalo [Pitavastatin Calcium]     pain   Lovastatin     REACTION: myalgias   Rosuvastatin     REACTION: myalgia   Zetia [Ezetimibe]     Side effects   Outpatient Encounter Medications as of 03/27/2022  Medication Sig Note   cholecalciferol (VITAMIN D) 1000 UNITS tablet Take 2,000 Units by mouth every 3 (three) days.    cyanocobalamin  1000 MCG tablet Take 1,000 mcg by mouth daily.     Glucosamine HCl 500 MG TABS Take by mouth 2 (two) times daily.     icosapent Ethyl (VASCEPA) 1 g capsule Take 2 capsules (2 g total) by mouth 2 (two) times daily.    lisinopril-hydrochlorothiazide (ZESTORETIC) 10-12.5 MG tablet Take 1 tablet by mouth daily.    metFORMIN (GLUCOPHAGE) 500 MG tablet Take 1 tablet (500 mg total) by mouth daily. 01/01/2022: 01/01/22: Reports stopped taking due to muscle aches   Turmeric 500 MG TABS Take 2 capsules by mouth 2 (two) times daily.    No facility-administered encounter medications on file as of 03/27/2022.   Patient Active Problem List   Diagnosis Date Noted   Shoulder pain, bilateral 02/07/2022   History of COVID-19 10/10/2021   Head contusion 07/06/2021   Piriformis muscle pain 07/07/2020   Right ankle pain 07/07/2020   Dysuria 08/24/2019   Low back pain 08/24/2019   Laceration of skin of right knee 07/12/2019   Bruit of right carotid artery 07/16/2018   Abdominal pain 01/06/2018   Right knee pain 12/21/2016   Hip flexor tightness 11/30/2015   Piriformis syndrome of left side 04/20/2014   IT band syndrome 04/20/2014   Frequent falls 04/20/2014   Diabetes mellitus type 2 without retinopathy ([place]) 02/19/2013   Well adult exam 07/13/2012   Left lumbar radiculopathy 04/08/2012   Macular hole 02/28/2012   Status post intraocular lens implant 02/28/2012   Vitamin D deficiency 04/30/2008   Dyslipidemia 10/31/2007   RECENT RETINAL  DETACHMENT PARTIAL W/GIANT TEAR 10/31/2007   Essential hypertension 10/31/2007   Conditions to be addressed/monitored:  HTN and HLD; pre-diabetes  Care Plan : RN Care Manager Plan of Care  Updates made by Knox Royalty, RN since 03/27/2022 12:00 AM     Problem: Chronic Disease Management Needs   Priority: Low     Long-Range Goal: Ongoing adherence to established plan of care for long term chronic disease management   Start Date: 04/25/2021  Expected End Date:  04/25/2022  Priority: Low  Note:   Current Barriers:  Chronic Disease Management support and education needs related to HLD and DMII  RNCM Clinical Goal(s):  Patient will demonstrate ongoing health management independence DM; HTN; HLD  through collaboration with RN Care manager, provider, and care team.   Interventions: 1:1 collaboration with primary care provider regarding development and update of comprehensive plan of care as evidenced by provider attestation and co-signature Inter-disciplinary care team collaboration (see longitudinal plan of care) Evaluation of current treatment plan related to  self management and patient's adherence to plan as established by provider; Review of patient status, including review of consultants reports, relevant laboratory and other test results, and medications completed SDOH updated: no new/ unmet concerns identified Pain assessment updated: denies pain today Reviewed recent PCP office visit 02/07/22: patient verbalizes a good understanding of lab results; post-office visit plan of care; denies questions post- recent PCP office visit; confirms no medication changes made at time of PCP office visit Medications discussed:  reports continues to independently self-manage and denies current concerns/ issues/ questions around medications; endorses adherence to taking all medications as prescribed Discussed plans with patient for ongoing care management follow up- patient denies current care coordination/ care management needs and is agreeable to CCM RN CM case closure today; verbalizes understanding to contact PCP or other care providers for any needs that arise in the future, and confirms she has contact information for all care providers     Diabetes:  (Status: 03/27/22: Goal Met.) Long Term Goal Lab Results  Component Value Date   HGBA1C 5.8 02/05/2022     Reviewed prescribed diet with patient carb modified, low sugar heart healthy; confirmed patient  continues following recommended diet Counseled on importance of regular laboratory monitoring as prescribed;   reviewed most recent A1-C test value result from 02/07/22-- noted no change in A1-C trends: positive reinforcement provided with encouragement to continue efforts Advised patient, providing education and rationale, to continue monitoring/ recording fasting blood sugars several times each week   Reviewed recent fasting blood sugars at home: she reports consistent ranges between 105-110      Confirmed patient participates in regular structured activity/ exercise as much as possible around weather conditions: she has not been as active lately as she normally is- but verbalizes plans to resume activity soon-- is considering starting a strength training program with her church; this was encouraged  Hyperlipidemia:  (Status: 03/27/22: Goal Met.) Long Term Goal Lab Results  Component Value Date   CHOL 239 (H) 02/05/2022   HDL 55.80 02/05/2022   LDLCALC 151 (H) 02/05/2022   LDLDIRECT 154.0 10/09/2021   TRIG 157.0 (H) 02/05/2022   CHOLHDL 4 02/05/2022  Reviewed importance of limiting foods high in cholesterol; Reviewed exercise goals and target of 150 minutes per week;     Plan: No further follow up required: patient denies current care coordination/ care management needs and is agreeable to CCM RN CM case closure today; CCM RN CM case closure accordingly  Oneta Rack, RN, BSN, [place] [phone]: direct office   Medical screening examination/treatment/procedure(s) were performed by non-physician practitioner and as supervising physician I was immediately available for consultation/collaboration.  I agree with above. Lew Dawes, MD

## 2022-04-03 ENCOUNTER — Other Ambulatory Visit: Payer: Self-pay | Admitting: Internal Medicine

## 2022-04-16 DIAGNOSIS — E785 Hyperlipidemia, unspecified: Secondary | ICD-10-CM

## 2022-04-16 DIAGNOSIS — E119 Type 2 diabetes mellitus without complications: Secondary | ICD-10-CM

## 2022-04-16 DIAGNOSIS — I10 Essential (primary) hypertension: Secondary | ICD-10-CM | POA: Diagnosis not present

## 2022-06-13 ENCOUNTER — Telehealth: Payer: Self-pay | Admitting: Internal Medicine

## 2022-06-13 NOTE — Telephone Encounter (Signed)
LVM for pt to rtn my call to schedule AWV with [facility] call back # [phone] 

## 2022-06-25 ENCOUNTER — Ambulatory Visit (INDEPENDENT_AMBULATORY_CARE_PROVIDER_SITE_OTHER): Payer: Medicare PPO

## 2022-06-25 VITALS — Ht 60.0 in | Wt 122.0 lb

## 2022-06-25 DIAGNOSIS — Z0000 Encounter for general adult medical examination without abnormal findings: Secondary | ICD-10-CM

## 2022-06-25 NOTE — Patient Instructions (Signed)
Lisa [name] , Thank you for taking time to come for your Medicare Wellness Visit. I appreciate your ongoing commitment to your health goals. Please review the following plan we discussed and let me know if I can assist you in the future.   These are the goals we discussed:  Goals      DIET - INCREASE WATER INTAKE        This is a list of the screening recommended for you and due dates:  Health Maintenance  Topic Date Due   Complete foot exam   Never done   Yearly kidney health urinalysis for diabetes  Never done   Zoster (Shingles) Vaccine (1 of 2) Never done   COVID-19 Vaccine (4 - Pfizer risk series) 08/19/2020   Eye exam for diabetics  08/17/2021   Flu Shot  04/17/2022   Hemoglobin A1C  08/08/2022   Yearly kidney function blood test for diabetes  02/06/2023   Tetanus Vaccine  10/15/2023   Pneumonia Vaccine  Completed   DEXA scan (bone density measurement)  Completed   Hepatitis C Screening: USPSTF Recommendation to screen - Ages 2-79 yo.  Completed   HPV Vaccine  Aged Out    Advanced directives: Advance directive discussed with you today. I have provided a copy for you to complete at home and have notarized. Once this is complete please bring a copy in to our office so we can scan it into your chart.   Conditions/risks identified: Aim for 30 minutes of exercise or brisk walking, 6-8 glasses of water, and 5 servings of fruits and vegetables each day.   Next appointment: Follow up in one year for your annual wellness visit    Preventive Care 65 Years and Older, Female Preventive care refers to lifestyle choices and visits with your health care provider that can promote health and wellness. What does preventive care include? A yearly physical exam. This is also called an annual well check. Dental exams once or twice a year. Routine eye exams. Ask your health care provider how often you should have your eyes checked. Personal lifestyle choices, including: Daily care of your  teeth and gums. Regular physical activity. Eating a healthy diet. Avoiding tobacco and drug use. Limiting alcohol use. Practicing safe sex. Taking low-dose aspirin every day. Taking vitamin and mineral supplements as recommended by your health care provider. What happens during an annual well check? The services and screenings done by your health care provider during your annual well check will depend on your age, overall health, lifestyle risk factors, and family history of disease. Counseling  Your health care provider Ciesla ask you questions about your: Alcohol use. Tobacco use. Drug use. Emotional well-being. Home and relationship well-being. Sexual activity. Eating habits. History of falls. Memory and ability to understand (cognition). Work and work Statistician. Reproductive health. Screening  You Barcia have the following tests or measurements: Height, weight, and BMI. Blood pressure. Lipid and cholesterol levels. These Dollar be checked every 5 years, or more frequently if you are over 12 years old. Skin check. Lung cancer screening. You Deshazo have this screening every year starting at age 27 if you have a 30-pack-year history of smoking and currently smoke or have quit within the past 15 years. Fecal occult blood test (FOBT) of the stool. You Bankson have this test every year starting at age 33. Flexible sigmoidoscopy or colonoscopy. You Cadet have a sigmoidoscopy every 5 years or a colonoscopy every 10 years starting at age 6. Hepatitis C  blood test. Hepatitis B blood test. Sexually transmitted disease (STD) testing. Diabetes screening. This is done by checking your blood sugar (glucose) after you have not eaten for a while (fasting). You Reichardt have this done every 1-3 years. Bone density scan. This is done to screen for osteoporosis. You Roen have this done starting at age 44. Mammogram. This Grenz be done every 1-2 years. Talk to your health care provider about how often you should have  regular mammograms. Talk with your health care provider about your test results, treatment options, and if necessary, the need for more tests. Vaccines  Your health care provider Bonaventure recommend certain vaccines, such as: Influenza vaccine. This is recommended every year. Tetanus, diphtheria, and acellular pertussis (Tdap, Td) vaccine. You Vaile need a Td booster every 10 years. Zoster vaccine. You Pippins need this after age 50. Pneumococcal 13-valent conjugate (PCV13) vaccine. One dose is recommended after age 22. Pneumococcal polysaccharide (PPSV23) vaccine. One dose is recommended after age 24. Talk to your health care provider about which screenings and vaccines you need and how often you need them. This information is not intended to replace advice given to you by your health care provider. Make sure you discuss any questions you have with your health care provider. Document Released: 09/30/2015 Document Revised: 05/23/2016 Document Reviewed: 07/05/2015 Elsevier Interactive Patient Education  2017 [place] Prevention in the Home Falls can cause injuries. They can happen to people of all ages. There are many things you can do to make your home safe and to help prevent falls. What can I do on the outside of my home? Regularly fix the edges of walkways and driveways and fix any cracks. Remove anything that might make you trip as you walk through a door, such as a raised step or threshold. Trim any bushes or trees on the path to your home. Use bright outdoor lighting. Clear any walking paths of anything that might make someone trip, such as rocks or tools. Regularly check to see if handrails are loose or broken. Make sure that both sides of any steps have handrails. Any raised decks and porches should have guardrails on the edges. Have any leaves, snow, or ice cleared regularly. Use sand or salt on walking paths during winter. Clean up any spills in your garage right away. This  includes oil or grease spills. What can I do in the bathroom? Use night lights. Install grab bars by the toilet and in the tub and shower. Do not use towel bars as grab bars. Use non-skid mats or decals in the tub or shower. If you need to sit down in the shower, use a plastic, non-slip stool. Keep the floor dry. Clean up any water that spills on the floor as soon as it happens. Remove soap buildup in the tub or shower regularly. Attach bath mats securely with double-sided non-slip rug tape. Do not have throw rugs and other things on the floor that can make you trip. What can I do in the bedroom? Use night lights. Make sure that you have a light by your bed that is easy to reach. Do not use any sheets or blankets that are too big for your bed. They should not hang down onto the floor. Have a firm chair that has side arms. You can use this for support while you get dressed. Do not have throw rugs and other things on the floor that can make you trip. What can I do in the kitchen?  Clean up any spills right away. Avoid walking on wet floors. Keep items that you use a lot in easy-to-reach places. If you need to reach something above you, use a strong step stool that has a grab bar. Keep electrical cords out of the way. Do not use floor polish or wax that makes floors slippery. If you must use wax, use non-skid floor wax. Do not have throw rugs and other things on the floor that can make you trip. What can I do with my stairs? Do not leave any items on the stairs. Make sure that there are handrails on both sides of the stairs and use them. Fix handrails that are broken or loose. Make sure that handrails are as long as the stairways. Check any carpeting to make sure that it is firmly attached to the stairs. Fix any carpet that is loose or worn. Avoid having throw rugs at the top or bottom of the stairs. If you do have throw rugs, attach them to the floor with carpet tape. Make sure that you  have a light switch at the top of the stairs and the bottom of the stairs. If you do not have them, ask someone to add them for you. What else can I do to help prevent falls? Wear shoes that: Do not have high heels. Have rubber bottoms. Are comfortable and fit you well. Are closed at the toe. Do not wear sandals. If you use a stepladder: Make sure that it is fully opened. Do not climb a closed stepladder. Make sure that both sides of the stepladder are locked into place. Ask someone to hold it for you, if possible. Clearly mark and make sure that you can see: Any grab bars or handrails. First and last steps. Where the edge of each step is. Use tools that help you move around (mobility aids) if they are needed. These include: Canes. Walkers. Scooters. Crutches. Turn on the lights when you go into a dark area. Replace any light bulbs as soon as they burn out. Set up your furniture so you have a clear path. Avoid moving your furniture around. If any of your floors are uneven, fix them. If there are any pets around you, be aware of where they are. Review your medicines with your doctor. Some medicines can make you feel dizzy. This can increase your chance of falling. Ask your doctor what other things that you can do to help prevent falls. This information is not intended to replace advice given to you by your health care provider. Make sure you discuss any questions you have with your health care provider. Document Released: 06/30/2009 Document Revised: 02/09/2016 Document Reviewed: 10/08/2014 Elsevier Interactive Patient Education  2017 [employer].

## 2022-06-25 NOTE — Progress Notes (Addendum)
Subjective:   Lisa [name] is a 76 y.o. female who presents for Medicare Annual (Subsequent) preventive examination.   Virtual Visit via Telephone Note  I connected with  Lisa [name] on 06/25/22 at  8:15 AM EDT by telephone and verified that I am speaking with the correct person using two identifiers.  Location: Patient: home  Provider: Esmond Plants  Persons participating in the virtual visit: [place]   I discussed the limitations, risks, security and privacy concerns of performing an evaluation and management service by telephone and the availability of in person appointments. The patient expressed understanding and agreed to proceed.  Interactive audio and video telecommunications were attempted between this nurse and patient, however failed, due to patient having technical difficulties OR patient did not have access to video capability.  We continued and completed visit with audio only.  Some vital signs [place] be absent or patient reported.   Daphane Shepherd, LPN  Review of Systems     Cardiac Risk Factors include: advanced age (>64mn, >>44women);diabetes mellitus;hypertension;dyslipidemia     Objective:    Today's Vitals   06/25/22 0820  Weight: 122 lb (55.3 kg)  Height: 5' (1.524 m)   Body mass index is 23.83 kg/m.     06/25/2022    8:29 AM 04/25/2021    9:00 AM 12/15/2020    8:55 AM 08/04/2019    8:36 AM 07/02/2019    9:29 AM 06/25/2017   10:29 AM  Advanced Directives  Does Patient Have a Medical Advance Directive? No No No No No No  Would patient like information on creating a medical advance directive? No - Patient declined No - Patient declined Yes (MAU/Ambulatory/Procedural Areas - Information given) No - Patient declined  Yes (ED - Information included in AVS)    Current Medications (verified) Outpatient Encounter Medications as of 06/25/2022  Medication Sig   cholecalciferol (VITAMIN D) 1000 UNITS tablet Take 2,000 Units by mouth  every 3 (three) days.   cyanocobalamin 1000 MCG tablet Take 1,000 mcg by mouth daily.    Glucosamine HCl 500 MG TABS Take by mouth 2 (two) times daily.    icosapent Ethyl (VASCEPA) 1 g capsule Take 2 capsules (2 g total) by mouth 2 (two) times daily.   lisinopril-hydrochlorothiazide (ZESTORETIC) 10-12.5 MG tablet Take 1 tablet by mouth daily.   metFORMIN (GLUCOPHAGE) 500 MG tablet TAKE 1 TABLET BY MOUTH EVERY DAY   Turmeric 500 MG TABS Take 2 capsules by mouth 2 (two) times daily.   No facility-administered encounter medications on file as of 06/25/2022.    Allergies (verified) Atorvastatin, Ezetimibe-simvastatin, Fish oil, Livalo [pitavastatin calcium], Lovastatin, Rosuvastatin, and Zetia [ezetimibe]   History: Past Medical History:  Diagnosis Date   Arthritis    Cataract 2009   right-Dr. SGershon Crane  Hyperlipidemia    Hypertension    Post-operative nausea and vomiting    Retinal tear 2008   B- Dr. GCharolette Forward  Vitamin D deficiency    Past Surgical History:  Procedure Laterality Date   APPENDECTOMY  1963   CATARACT EXTRACTION     Dr. SGershon Crane  POLYPECTOMY     RETINAL TEAR REPAIR CRYOTHERAPY     Dr. 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 every week per pt   Drug use: No   Sexual activity: Not Currently  Other Topics Concern   Not on  file  Social History Narrative   Regular exercise- Yes   Social Determinants of Health   Financial Resource Strain: Low Risk  (06/25/2022)   Overall Financial Resource Strain (CARDIA)    Difficulty of Paying Living Expenses: Not hard at all  Food Insecurity: No Food Insecurity (06/25/2022)   Hunger Vital Sign    Worried About Running Out of Food in the Last Year: Never true    [place] in the Last Year: Never true  Transportation Needs: No Transportation Needs (06/25/2022)   PRAPARE - Hydrologist (Medical): No    Lack of Transportation (Non-Medical): No  Physical Activity: Sufficiently Active (06/25/2022)   Exercise Vital Sign    Days of Exercise per Week: 5 days    Minutes of Exercise per Session: 30 min  Stress: No Stress Concern Present (06/25/2022)   [place]    Feeling of Stress : Not at all  Social Connections: Moderately Integrated (06/25/2022)   Social Connection and Isolation Panel [NHANES]    Frequency of Communication with Friends and Family: More than three times a week    Frequency of Social Gatherings with Friends and Family: More than three times a week    Attends Religious Services: More than 4 times per year    Active Member of [employer] or Organizations: Yes    Attends Music therapist: More than 4 times per year    Marital Status: Never married    Tobacco Counseling Counseling given: Not Answered   Clinical Intake:  Pre-visit preparation completed: Yes  Pain : No/denies pain     Nutritional Risks: None Diabetes: No  How often do you need to have someone help you when you read instructions, pamphlets, or other written materials from your doctor or pharmacy?: 1 - Never  Diabetic?yes  Nutrition Risk Assessment:  Has the patient had any N/V/D within the last 2 months?  No  Does the patient have any non-healing wounds?  No  Has the patient  had any unintentional weight loss or weight gain?  No   Diabetes:  Is the patient diabetic?  Yes  If diabetic, was a CBG obtained today?  No  Did the patient bring in their glucometer from home?  No  How often do you monitor your CBG's? Weekly .   Financial Strains and Diabetes Management:  Are you having any financial strains with the device, your supplies or your medication? No .  Does the patient want to be seen by Chronic Care Management for management of their diabetes?  No  Would the patient like to be referred to a Nutritionist or for Diabetic Management?  No   Diabetic Exams:  Diabetic Eye Exam:  Completed 09/2021 Diabetic Foot Exam: Overdue, Pt has been advised about the importance in completing this exam. Pt is scheduled for diabetic foot exam on next office visit .   Interpreter Needed?: No  Information entered by :: Jadene Pierini, LPN   Activities of Daily Living    06/25/2022    8:30 AM  In your present state of health, do you have any difficulty performing the following activities:  Hearing? 0  Vision? 0  Difficulty concentrating or making decisions? 0  Walking or climbing stairs? 0  Dressing or bathing? 0  Doing errands, shopping? 0  Preparing Food and eating ? N  Using the Toilet? N  In the past six months, have you accidently leaked urine? N  Do you have problems with loss of bowel control? N  Managing your Medications? N  Managing your Finances? N  Housekeeping or managing your Housekeeping? N    Patient Care Team: [name], [name], MD as PCP - General Newman Pies, MD (Neurosurgery) Dyke Maes, [place] as Referring Physician (Optometry) Weston Brass, MD (Gynecology) Irene Shipper, MD as Consulting Physician (Gastroenterology) Gerarda Fraction, MD as Referring Physician (Ophthalmology)  Indicate any recent Medical Services you [place] have received from other than [facility] providers in the past year (date Dibari be approximate).     Assessment:    This is a routine wellness examination for [facility].  Hearing/Vision screen Vision Screening - Comments:: Annual eye exam wear glasses   Dietary issues and exercise activities discussed: Current Exercise Habits: Home exercise routine, Time (Minutes): 30, Frequency (Times/Week): 5, Weekly Exercise (Minutes/Week): 150, Intensity: Mild, Exercise limited by: None identified   Goals Addressed             This Visit's Progress    DIET - INCREASE WATER INTAKE         Depression Screen    06/25/2022    8:28 AM 01/01/2022    9:00 AM 04/25/2021    9:00 AM 12/15/2020    8:54 AM 08/04/2019    8:26 AM 06/25/2017    9:34 AM 12/07/2015    9:16 AM  PHQ 2/9 Scores  PHQ - 2 Score 0 0 0 0 0 0 0    Fall Risk    06/25/2022    8:24 AM 01/01/2022    9:00 AM 08/28/2021    9:00 AM 04/25/2021    9:00 AM 12/15/2020    8:55 AM  Fall Risk   Falls in the past year? 0  1 0 1  Comment  denies new/ recent falls x 12 months; does not use assistive devices     Number falls in past yr:  0 0 0 0  Injury with Fall?  0 0 0 1  Comment    N/A- no falls over last 12 months reported   Risk for fall due to :  History of fall(s) History of fall(s) Other (Comment);History of fall(s) Orthopedic patient  Risk for fall due to: Comment    "weak ankles"   Follow up  Falls prevention discussed Falls prevention discussed Falls prevention discussed Falls evaluation completed    FALL RISK PREVENTION PERTAINING TO THE HOME:  Any stairs in or around the home? No  If so, are there any without handrails? No  Home free of loose throw rugs in walkways, pet beds, electrical cords, etc? Yes  Adequate lighting in your home to reduce risk of falls? Yes   ASSISTIVE DEVICES UTILIZED TO PREVENT FALLS:  Life alert? No  Use of a cane, walker or w/c? No  Grab bars in the bathroom? Yes  Shower chair or bench in shower? No  Elevated toilet seat or a handicapped toilet? No       06/25/2017   10:51 AM  MMSE - Mini Mental State Exam   Orientation to time 5  Orientation to Place 5  Registration 3  Attention/ Calculation 5  Recall 3  Language- name 2 objects 2  Language- repeat 1  Language- follow 3 step command 3  Language- read & follow direction 1  Write a sentence 1  Copy design 1  Total score 30        06/25/2022    8:30 AM  6CIT Screen  What Year? 0 points  What month? 0 points  What time? 0 points  Count back from 20 0 points  Months in reverse 0 points  Repeat phrase 0 points  Total Score 0 points    Immunizations Immunization History  Administered Date(s) Administered   Fluad Quad(high Dose 65+) 06/17/2020   Hepatitis A, Adult 06/02/2018   Influenza Split 07/11/2012   Influenza, High Dose Seasonal PF 06/20/2017, 06/20/2018, 05/28/2019   Influenza,inj,Quad PF,6+ Mos 05/28/2016   Influenza-Unspecified 10/14/2013, 06/17/2014, 06/18/2015, 07/13/2021   PFIZER(Purple Top)SARS-COV-2 Vaccination 10/06/2019, 10/27/2019, 06/24/2020   Pneumococcal Conjugate-13 09/17/2008   Pneumococcal Polysaccharide-23 11/02/2014   Td 10/14/2013   Zoster, Live 03/10/2010    TDAP status: Up to date  Flu Vaccine status: Up to date  Pneumococcal vaccine status: Up to date  Covid-19 vaccine status: Completed vaccines  Qualifies for Shingles Vaccine? Yes   Zostavax completed No   Shingrix Completed?: No.    Education has been provided regarding the importance of this vaccine. Patient has been advised to call insurance company to determine out of pocket expense if they have not yet received this vaccine. Advised Aki also receive vaccine at local pharmacy or Health Dept. Verbalized acceptance and understanding.  Screening Tests Health Maintenance  Topic Date Due   FOOT EXAM  Never done   Diabetic kidney evaluation - Urine ACR  Never done   Zoster Vaccines- Shingrix (1 of 2) Never done   COVID-19 Vaccine (4 - Pfizer risk series) 08/19/2020   OPHTHALMOLOGY EXAM  08/17/2021   INFLUENZA VACCINE  04/17/2022    HEMOGLOBIN A1C  08/08/2022   Diabetic kidney evaluation - GFR measurement  02/06/2023   TETANUS/TDAP  10/15/2023   Pneumonia Vaccine 54+ Years old  Completed   DEXA SCAN  Completed   Hepatitis C Screening  Completed   HPV VACCINES  Aged Out    Health Maintenance  Health Maintenance Due  Topic Date Due   FOOT EXAM  Never done   Diabetic kidney evaluation - Urine ACR  Never done   Zoster Vaccines- Shingrix (1 of 2) Never done   COVID-19 Vaccine (4 - Pfizer risk series) 08/19/2020   OPHTHALMOLOGY EXAM  08/17/2021   INFLUENZA VACCINE  04/17/2022    Colorectal cancer screening: No longer required.   Mammogram status: No longer required due to age.  Bone Density status: Completed 07/24/2017. Results reflect: Bone density results: OSTEOPENIA. Repeat every 5 years.  Lung Cancer Screening: (Low Dose CT Chest recommended if Age 28-80 years, 30 pack-year currently smoking OR have quit w/in 15years.) does not qualify.   Lung Cancer Screening Referral: n/a  Additional Screening:  Hepatitis C Screening: does not qualify;  Vision Screening: Recommended annual ophthalmology exams for early detection of glaucoma and other disorders of  the eye. Is the patient up to date with their annual eye exam?  Yes  Who is the provider or what is the name of the office in which the patient attends annual eye exams? Dr.Griven  If pt is not established with a provider, would they like to be referred to a provider to establish care? No .   Dental Screening: Recommended annual dental exams for proper oral hygiene  Community Resource Referral / Chronic Care Management: CRR required this visit?  No   CCM required this visit?  No      Plan:     I have personally reviewed and noted the following in the patient's chart:   Medical and social history Use of alcohol, tobacco or illicit drugs  Current medications and supplements including opioid prescriptions. Patient is not currently taking opioid  prescriptions. Functional ability and status Nutritional status Physical activity Advanced directives List of other physicians Hospitalizations, surgeries, and ER visits in previous 12 months Vitals Screenings to include cognitive, depression, and falls Referrals and appointments  In addition, I have reviewed and discussed with patient certain preventive protocols, quality metrics, and best practice recommendations. A written personalized care plan for preventive services as well as general preventive health recommendations were provided to patient.     Daphane Shepherd, LPN   [id number]/02/[id number]   Nurse Notes: none   Medical screening examination/treatment/procedure(s) were performed by non-physician practitioner and as supervising physician I was immediately available for consultation/collaboration.  I agree with above. Lew Dawes, MD

## 2022-07-09 ENCOUNTER — Telehealth: Payer: Medicare PPO

## 2022-08-08 ENCOUNTER — Other Ambulatory Visit (INDEPENDENT_AMBULATORY_CARE_PROVIDER_SITE_OTHER): Payer: Medicare PPO

## 2022-08-08 DIAGNOSIS — E785 Hyperlipidemia, unspecified: Secondary | ICD-10-CM

## 2022-08-08 DIAGNOSIS — I10 Essential (primary) hypertension: Secondary | ICD-10-CM

## 2022-08-08 DIAGNOSIS — Z0000 Encounter for general adult medical examination without abnormal findings: Secondary | ICD-10-CM | POA: Diagnosis not present

## 2022-08-08 DIAGNOSIS — E119 Type 2 diabetes mellitus without complications: Secondary | ICD-10-CM | POA: Diagnosis not present

## 2022-08-08 LAB — COMPREHENSIVE METABOLIC PANEL
ALT: 15 U/L (ref 0–35)
AST: 14 U/L (ref 0–37)
Albumin: 4 g/dL (ref 3.5–5.2)
Alkaline Phosphatase: 64 U/L (ref 39–117)
BUN: 14 mg/dL (ref 6–23)
CO2: 27 mEq/L (ref 19–32)
Calcium: 9 mg/dL (ref 8.4–10.5)
Chloride: 101 mEq/L (ref 96–112)
Creatinine, Ser: 0.52 mg/dL (ref 0.40–1.20)
GFR: 90.18 mL/min (ref >60.00)
Glucose, Bld: 106 mg/dL — ABNORMAL HIGH (ref 70–99)
Potassium: 3.9 mEq/L (ref 3.5–5.1)
Sodium: 136 mEq/L (ref 135–145)
Total Bilirubin: 0.5 mg/dL (ref 0.2–1.2)
Total Protein: 6.7 g/dL (ref 6.0–8.3)

## 2022-08-08 LAB — URINALYSIS, ROUTINE W REFLEX MICROSCOPIC
Bilirubin Urine: NEGATIVE
Hgb urine dipstick: NEGATIVE
Ketones, ur: NEGATIVE
Nitrite: NEGATIVE
RBC / HPF: NONE SEEN (ref 0–?)
Specific Gravity, Urine: 1.01 (ref 1.000–1.030)
Total Protein, Urine: NEGATIVE
Urine Glucose: NEGATIVE
Urobilinogen, UA: 0.2 (ref 0.0–1.0)
pH: 8 (ref 5.0–8.0)

## 2022-08-08 LAB — CBC WITH DIFFERENTIAL/PLATELET
Basophils Absolute: 0 10*3/uL (ref 0.0–0.1)
Basophils Relative: 0.9 % (ref 0.0–3.0)
Eosinophils Absolute: 0.1 10*3/uL (ref 0.0–0.7)
Eosinophils Relative: 1.6 % (ref 0.0–5.0)
HCT: 43.2 % (ref 36.0–46.0)
Hemoglobin: 14.6 g/dL (ref 12.0–15.0)
Lymphocytes Relative: 36.6 % (ref 12.0–46.0)
Lymphs Abs: 1.6 10*3/uL (ref 0.7–4.0)
MCHC: 33.7 g/dL (ref 30.0–36.0)
MCV: 88.8 fl (ref 78.0–100.0)
Monocytes Absolute: 0.5 10*3/uL (ref 0.1–1.0)
Monocytes Relative: 11.2 % (ref 3.0–12.0)
Neutro Abs: 2.2 10*3/uL (ref 1.4–7.7)
Neutrophils Relative %: 49.7 % (ref 43.0–77.0)
Platelets: 316 10*3/uL (ref 150.0–400.0)
RBC: 4.87 Mil/uL (ref 3.87–5.11)
RDW: 12.8 % (ref 11.5–15.5)
WBC: 4.4 10*3/uL (ref 4.0–10.5)

## 2022-08-08 LAB — LIPID PANEL
Cholesterol: 226 mg/dL — ABNORMAL HIGH (ref 0–200)
HDL: 57.6 mg/dL (ref >39.00)
LDL Cholesterol: 147 mg/dL — ABNORMAL HIGH (ref 0–99)
NonHDL: 168.44
Total CHOL/HDL Ratio: 4
Triglycerides: 109 mg/dL (ref 0.0–149.0)
VLDL: 21.8 mg/dL (ref 0.0–40.0)

## 2022-08-08 LAB — HEMOGLOBIN A1C: Hgb A1c MFr Bld: 5.8 % (ref 4.6–6.5)

## 2022-08-08 LAB — TSH: TSH: 0.81 u[IU]/mL (ref 0.35–5.50)

## 2022-08-13 ENCOUNTER — Encounter: Payer: Self-pay | Admitting: Internal Medicine

## 2022-08-13 ENCOUNTER — Ambulatory Visit: Payer: Medicare PPO | Admitting: Internal Medicine

## 2022-08-13 VITALS — BP 126/78 | HR 72 | Temp 98.2°F | Ht 60.0 in | Wt 119.0 lb

## 2022-08-13 DIAGNOSIS — E119 Type 2 diabetes mellitus without complications: Secondary | ICD-10-CM

## 2022-08-13 DIAGNOSIS — E785 Hyperlipidemia, unspecified: Secondary | ICD-10-CM

## 2022-08-13 DIAGNOSIS — I10 Essential (primary) hypertension: Secondary | ICD-10-CM

## 2022-08-13 NOTE — Assessment & Plan Note (Signed)
Chronic refractory Cont on Prinizide

## 2022-08-13 NOTE — Assessment & Plan Note (Signed)
CT coronary calcium score 0. Minimal carotid plaque 2019 Cont on  Vascepa

## 2022-08-13 NOTE — Progress Notes (Signed)
Subjective:  Patient ID: Lisa [name], female    DOB: 12-27-1945  Age: 76 y.o. MRN: [id number]  CC: No chief complaint on file.   HPI Lisa [name] presents for dyslipidemia, HTN, hyperglcemia  Outpatient Medications Prior to Visit  Medication Sig Dispense Refill   cholecalciferol (VITAMIN D) 1000 UNITS tablet Take 1,000 Units by mouth every 3 (three) days.     cyanocobalamin 1000 MCG tablet Take 1,000 mcg by mouth daily.      Glucosamine HCl 500 MG TABS Take by mouth 2 (two) times daily.      icosapent Ethyl (VASCEPA) 1 g capsule Take 2 capsules (2 g total) by mouth 2 (two) times daily. 360 capsule 3   lisinopril-hydrochlorothiazide (ZESTORETIC) 10-12.5 MG tablet Take 1 tablet by mouth daily. 90 tablet 3   metFORMIN (GLUCOPHAGE) 500 MG tablet TAKE 1 TABLET BY MOUTH EVERY DAY 90 tablet 2   SODIUM FLUORIDE 5000 PPM 1.1 % PSTE Take by mouth.     Turmeric 500 MG TABS Take 2 capsules by mouth 2 (two) times daily.     No facility-administered medications prior to visit.    ROS: Review of Systems  Constitutional:  Negative for activity change, appetite change, chills, fatigue and unexpected weight change.  HENT:  Negative for congestion, mouth sores and sinus pressure.   Eyes:  Negative for visual disturbance.  Respiratory:  Negative for cough and chest tightness.   Gastrointestinal:  Negative for abdominal pain and nausea.  Genitourinary:  Negative for difficulty urinating, frequency and vaginal pain.  Musculoskeletal:  Negative for back pain and gait problem.  Skin:  Negative for pallor and rash.  Neurological:  Negative for dizziness, tremors, weakness, numbness and headaches.  Psychiatric/Behavioral:  Negative for confusion and sleep disturbance.     Objective:  BP 126/78 (BP Location: Right Arm, Patient Position: Sitting, Cuff Size: Normal)   Pulse 72   Temp 98.2 F (36.8 C) (Oral)   Ht 5' (1.524 m)   Wt 119 lb (54 kg)   SpO2 99%   BMI 23.24 kg/m   BP Readings from  Last 3 Encounters:  08/13/22 126/78  02/07/22 124/76  10/10/21 120/78    Wt Readings from Last 3 Encounters:  08/13/22 119 lb (54 kg)  06/25/22 122 lb (55.3 kg)  02/07/22 124 lb 9.6 oz (56.5 kg)    Physical Exam Constitutional:      General: She is not in acute distress.    Appearance: She is well-developed.  HENT:     Head: Normocephalic.     Right Ear: External ear normal.     Left Ear: External ear normal.     Nose: Nose normal.  Eyes:     General:        Right eye: No discharge.        Left eye: No discharge.     Conjunctiva/sclera: Conjunctivae normal.     Pupils: Pupils are equal, round, and reactive to light.  Neck:     Thyroid: No thyromegaly.     Vascular: No JVD.     Trachea: No tracheal deviation.  Cardiovascular:     Rate and Rhythm: Normal rate and regular rhythm.     Heart sounds: Normal heart sounds.  Pulmonary:     Effort: No respiratory distress.     Breath sounds: No stridor. No wheezing.  Abdominal:     General: Bowel sounds are normal. There is no distension.     Palpations: Abdomen is soft.  There is no mass.     Tenderness: There is no abdominal tenderness. There is no guarding or rebound.  Musculoskeletal:        General: No tenderness.     Cervical back: Normal range of motion and neck supple. No rigidity.  Lymphadenopathy:     Cervical: No cervical adenopathy.  Skin:    Findings: No erythema or rash.  Neurological:     Cranial Nerves: No cranial nerve deficit.     Motor: No abnormal muscle tone.     Coordination: Coordination normal.     Deep Tendon Reflexes: Reflexes normal.  Psychiatric:        Behavior: Behavior normal.        Thought Content: Thought content normal.        Judgment: Judgment normal.     Lab Results  Component Value Date   WBC 4.4 08/08/2022   HGB 14.6 08/08/2022   HCT 43.2 08/08/2022   PLT 316.0 08/08/2022   GLUCOSE 106 (H) 08/08/2022   CHOL 226 (H) 08/08/2022   TRIG 109.0 08/08/2022   HDL 57.60  08/08/2022   LDLDIRECT 154.0 10/09/2021   LDLCALC 147 (H) 08/08/2022   ALT 15 08/08/2022   AST 14 08/08/2022   NA 136 08/08/2022   K 3.9 08/08/2022   CL 101 08/08/2022   CREATININE 0.52 08/08/2022   BUN 14 08/08/2022   CO2 27 08/08/2022   TSH 0.81 08/08/2022   HGBA1C 5.8 08/08/2022    MM 3D SCREEN BREAST BILATERAL  Result Date: 02/14/2022 CLINICAL DATA:  Screening. EXAM: DIGITAL SCREENING BILATERAL MAMMOGRAM WITH TOMOSYNTHESIS AND CAD TECHNIQUE: Bilateral screening digital craniocaudal and mediolateral oblique mammograms were obtained. Bilateral screening digital breast tomosynthesis was performed. The images were evaluated with computer-aided detection. COMPARISON:  Previous exam(s). ACR Breast Density Category b: There are scattered areas of fibroglandular density. FINDINGS: There are no findings suspicious for malignancy. IMPRESSION: No mammographic evidence of malignancy. A result letter of this screening mammogram will be mailed directly to the patient. RECOMMENDATION: Screening mammogram in one year. (Code:SM-B-01Y) BI-RADS CATEGORY  1: Negative. Electronically Signed   By: Ileana Roup M.D.   On: 02/14/2022 11:12    Assessment & Plan:   Problem List Items Addressed This Visit     Diabetes mellitus type 2 without retinopathy ([place])    Monitor A1c       Relevant Orders   Urinalysis   CBC with Differential/Platelet   Hemoglobin A1c   Dyslipidemia - Primary    CT coronary calcium score 0. Minimal carotid plaque 2019 Cont on  Vascepa      Relevant Orders   Comprehensive metabolic panel   TSH   Lipid panel   Essential hypertension    Chronic refractory Cont on Prinizide      Relevant Orders   Urinalysis   CBC with Differential/Platelet      No orders of the defined types were placed in this encounter.     Follow-up: Return in about 6 months (around 02/11/2023) for Wellness Exam.  Walker Kehr, MD

## 2022-08-13 NOTE — Assessment & Plan Note (Signed)
Monitor A1c 

## 2022-11-09 ENCOUNTER — Other Ambulatory Visit: Payer: Self-pay | Admitting: Internal Medicine

## 2022-12-03 ENCOUNTER — Other Ambulatory Visit: Payer: Self-pay | Admitting: Internal Medicine

## 2022-12-04 ENCOUNTER — Ambulatory Visit: Payer: Medicare PPO | Admitting: Emergency Medicine

## 2022-12-04 ENCOUNTER — Encounter: Payer: Self-pay | Admitting: Emergency Medicine

## 2022-12-04 VITALS — BP 128/78 | HR 85 | Temp 98.2°F | Ht 60.0 in | Wt 119.0 lb

## 2022-12-04 DIAGNOSIS — S60411A Abrasion of left index finger, initial encounter: Secondary | ICD-10-CM

## 2022-12-04 DIAGNOSIS — W5501XA Bitten by cat, initial encounter: Secondary | ICD-10-CM

## 2022-12-04 MED ORDER — AMOXICILLIN-POT CLAVULANATE 875-125 MG PO TABS: 1.0000 | ORAL_TABLET | Freq: Two times a day (BID) | ORAL | 0 refills | Status: AC

## 2022-12-04 NOTE — Patient Instructions (Signed)
Animal Bite, Adult Animal bites can be mild or serious. Small bites from house pets normally are mild. Bites from cats, strays, or wild animals can be serious. If a stray or wild animal bites you, you need to get medical help right away. You Bar[place] also need a shot to prevent rabies infection. What increases the risk? Being near pets you do not know. Being near animals that are eating, sleeping, or caring for their babies. Being outside where small, wild animals move freely. What are the signs or symptoms? Pain. Bleeding. Swelling. Bruising. How is this treated? Treatment [place] include: Cleaning your wound. Rinsing out (flushing) your wound. This uses saline solution, which is made of salt and water. Putting a bandage on your wound. Closing your wound with stitches (sutures), staples, skin glue, or skin tape (adhesive strips). Antibiotic medicine. You [place] be given pills, cream, gel, or fluid through an IV. A tetanus shot. Rabies treatment, if the animal could have rabies. Surgery, if there is infection or damage that needs to be fixed. Follow these instructions at home: Medicines Take or apply over-the-counter and prescription medicines only as told by your doctor. If you were prescribed an antibiotic medicine, take or apply it as told by your doctor. Do not stop using it even if your wound gets better. Wound care  Follow instructions from your doctor about how to take care of your wound. Make sure you: Wash your hands with soap and water for at least 20 seconds before and after you change your bandage. If you cannot use soap and water, use hand sanitizer. Change your bandage. Leave stitches or skin glue in place for at least 2 weeks. Leave tape strips alone unless you are told to take them off. You [place] trim the edges of the tape strips if they curl up. Check your wound every day for signs of infection. Check for: More redness, swelling, or pain. More fluid or blood. Warmth. Pus or a  bad smell. General instructions  Raise (elevate) the injured area above the level of your heart while you are sitting or lying down. If told, put ice on the injured area. To do this: Put ice in a plastic bag. Place a towel between your skin and the bag. Leave the ice on for 20 minutes, 2-3 times per day. Take off the ice if your skin turns bright red. This is very important. If you cannot feel pain, heat, or cold, you have a greater risk of damage to the area. Keep all follow-up visits. Contact a doctor if: You have more redness, swelling, or pain around your wound. Your wound feels warm to the touch. You have a fever or chills. You have a general feeling of sickness (malaise). You feel like you Kimmons vomit. You vomit. You have pain that does not get better. Get help right away if: You have a red streak going away from your wound. You have any of these coming from your wound: Non-clear fluid. More blood. Pus or a bad smell. You have trouble moving your injured area. You lose feeling (have numbness) or feel tingling anywhere on your body. Summary Animal bites can be mild or serious. If a stray or wild animal bites you, you need to get medical help right away. Your doctor will look at the wound and [place] ask about how the animal bite happened. Treatment [place] include wound care, antibiotic medicine, a tetanus shot, and rabies treatment. This information is not intended to replace advice given to you   by your health care provider. Make sure you discuss any questions you have with your health care provider. Document Revised: 09/08/2021 Document Reviewed: 09/08/2021 Elsevier Patient Education  2023 [employer]  

## 2022-12-04 NOTE — Assessment & Plan Note (Signed)
Complicated by infection. Recommend to start Augmentin 875 twice a day for 7 days Advised to contact the office if no better or worse during the next several days Wound care instructions given

## 2022-12-04 NOTE — Progress Notes (Signed)
Lisa [name] 76 y.o.   Chief Complaint  Patient presents with   Abrasion    Last Thursday her cat nipped her. And the itching started or the weekend. Pt states warm to the touch and redness. No draining     HISTORY OF PRESENT ILLNESS: Acute problem visit today.  Patient of Dr. Walker Kehr This is a 77 y.o. female complaining of cat scratch to left index finger now developing swelling and redness No other complaints or medical concerns today.  HPI   Prior to Admission medications   Medication Sig Start Date End Date Taking? Authorizing Provider  cholecalciferol (VITAMIN D) 1000 UNITS tablet Take 1,000 Units by mouth every 3 (three) days.   Yes [provider]  cyanocobalamin 1000 MCG tablet Take 1,000 mcg by mouth daily.    Yes [provider]  Glucosamine HCl 500 MG TABS Take by mouth 2 (two) times daily.    Yes [provider]  lisinopril-hydrochlorothiazide (ZESTORETIC) 10-12.5 MG tablet TAKE 1 TABLET BY MOUTH EVERY DAY 12/03/22  Yes [name], [name], MD  metFORMIN (GLUCOPHAGE) 500 MG tablet TAKE 1 TABLET BY MOUTH EVERY DAY 04/03/22  Yes [name], [name], MD  SODIUM FLUORIDE 5000 PPM 1.1 % PSTE Take by mouth. 03/08/22  Yes [provider]  Turmeric 500 MG TABS Take 2 capsules by mouth 2 (two) times daily.   Yes [provider]  VASCEPA 1 g capsule TAKE 2 CAPSULES BY MOUTH 2 TIMES DAILY. 11/09/22  Yes [name], [name], MD    Allergies  Allergen Reactions   Atorvastatin     REACTION: achy   Ezetimibe-Simvastatin     REACTION: myalgia   Fish Oil     REACTION: bruising   Livalo [Pitavastatin Calcium]     pain   Lovastatin     REACTION: myalgias   Rosuvastatin     REACTION: myalgia   Zetia [Ezetimibe]     Side effects    Patient Active Problem List   Diagnosis Date Noted   Shoulder pain, bilateral 02/07/2022   History of COVID-19 10/10/2021   Head contusion 07/06/2021   Piriformis muscle pain 07/07/2020    Right ankle pain 07/07/2020   Dysuria 08/24/2019   Low back pain 08/24/2019   Laceration of skin of right knee 07/12/2019   Bruit of right carotid artery 07/16/2018   Abdominal pain 01/06/2018   Right knee pain 12/21/2016   Hip flexor tightness 11/30/2015   Piriformis syndrome of left side 04/20/2014   IT band syndrome 04/20/2014   Frequent falls 04/20/2014   Diabetes mellitus type 2 without retinopathy ([place]) 02/19/2013   Well adult exam 07/13/2012   Left lumbar radiculopathy 04/08/2012   Macular hole 02/28/2012   Status post intraocular lens implant 02/28/2012   Vitamin D deficiency 04/30/2008   Dyslipidemia 10/31/2007   RECENT RETINAL DETACHMENT PARTIAL W/GIANT TEAR 10/31/2007   Essential hypertension 10/31/2007    Past Medical History:  Diagnosis Date   Arthritis    Cataract 2009   right-Dr. Gershon Crane   Hyperlipidemia    Hypertension    Post-operative nausea and vomiting    Retinal tear 2008   B- Dr. Charolette Forward   Vitamin D deficiency     Past Surgical History:  Procedure Laterality Date   APPENDECTOMY  1963   CATARACT EXTRACTION     Dr. Gershon Crane   POLYPECTOMY     RETINAL TEAR REPAIR CRYOTHERAPY     Dr. Charolette Forward    Social History  Socioeconomic History   Marital status: Single    Spouse name: Not on file   Number of children: Not on file   Years of education: Not on file   Highest education level: Not on file  Occupational History   Occupation: nutritionist [employer]  Tobacco Use   Smoking status: Never   Smokeless tobacco: Never  Vaping Use   Vaping Use: Never used  Substance and Sexual Activity   Alcohol use: Yes    Comment: wine occ. every week per pt   Drug use: No   Sexual activity: Not Currently  Other Topics Concern   Not on file  Social History Narrative   Regular exercise- Yes   Social Determinants of Health   Financial Resource Strain: Low Risk  (06/25/2022)   Overall Financial Resource Strain (CARDIA)    Difficulty of Paying Living  Expenses: Not hard at all  Food Insecurity: No Food Insecurity (06/25/2022)   Hunger Vital Sign    Worried About Running Out of Food in the Last Year: Never true    Vivian in the Last Year: Never true  Transportation Needs: No Transportation Needs (06/25/2022)   PRAPARE - Hydrologist (Medical): No    Lack of Transportation (Non-Medical): No  Physical Activity: Sufficiently Active (06/25/2022)   Exercise Vital Sign    Days of Exercise per Week: 5 days    Minutes of Exercise per Session: 30 min  Stress: No Stress Concern Present (06/25/2022)   [place]    Feeling of Stress : Not at all  Social Connections: Moderately Integrated (06/25/2022)   Social Connection and Isolation Panel [NHANES]    Frequency of Communication with Friends and Family: More than three times a week    Frequency of Social Gatherings with Friends and Family: More than three times a week    Attends Religious Services: More than 4 times per year    Active Member of [employer] or Organizations: Yes    Attends Music therapist: More than 4 times per year    Marital Status: Never married  Intimate Partner Violence: Not At Risk (06/25/2022)   Humiliation, Afraid, Rape, and Kick questionnaire    Fear of Current or Ex-Partner: No    Emotionally Abused: No    Physically Abused: No    Sexually Abused: No    Family History  Problem Relation Age of Onset   COPD Father    Heart disease Father    COPD Sister    Hyperlipidemia Other    Alzheimer's disease Mother    Colon cancer Maternal Aunt        60's   Stomach cancer Maternal Aunt      Review of Systems  Constitutional: Negative.  Negative for chills and fever.  HENT: Negative.  Negative for congestion and sore throat.   Respiratory: Negative.  Negative for cough and shortness of breath.   Cardiovascular: Negative.  Negative for chest pain and  palpitations.  Gastrointestinal:  Negative for abdominal pain, nausea and vomiting.  Skin:  Positive for rash.  Neurological:  Negative for dizziness and headaches.  All other systems reviewed and are negative.   Today's Vitals   12/04/22 1449  BP: 128/78  Pulse: 85  Temp: 98.2 F (36.8 C)  TempSrc: Oral  SpO2: 97%  Weight: 119 lb (54 kg)  Height: 5' (1.524 m)   Body mass index is 23.24 kg/m.  Physical Exam Vitals reviewed.  Constitutional:      Appearance: Normal appearance.  HENT:     Head: Normocephalic and atraumatic.  Eyes:     Extraocular Movements: Extraocular movements intact.  Cardiovascular:     Rate and Rhythm: Normal rate.  Pulmonary:     Effort: Pulmonary effort is normal.  Skin:    Capillary Refill: Capillary refill takes more than 3 seconds.     Comments: Left index finger: Proximal area shows some erythema and mild swelling  Neurological:     General: No focal deficit present.     Mental Status: She is alert and oriented to person, place, and time.  Psychiatric:        Mood and Affect: Mood normal.        Behavior: Behavior normal.      ASSESSMENT & PLAN: A total of 32 minutes was spent with the patient and counseling/coordination of care regarding preparing for this visit, review of most recent office visit note, review of chronic medical conditions under management, review of all medications, diagnosis of wound infection secondary to cat bite, need for antibiotics, wound care instructions, prognosis, documentation, need for follow-up if no better or worse during the next several days.  Problem List Items Addressed This Visit       Other   Cat bite - Primary    Complicated by infection. Recommend to start Augmentin 875 twice a day for 7 days Advised to contact the office if no better or worse during the next several days Wound care instructions given      Relevant Medications   amoxicillin-clavulanate (AUGMENTIN) 875-125 MG tablet    Patient Instructions  Animal Bite, Adult Animal bites can be mild or serious. Small bites from house pets normally are mild. Bites from cats, strays, or wild animals can be serious. If a stray or wild animal bites you, you need to get medical help right away. You [place] also need a shot to prevent rabies infection. What increases the risk? Being near pets you do not know. Being near animals that are eating, sleeping, or caring for their babies. Being outside where small, wild animals move freely. What are the signs or symptoms? Pain. Bleeding. Swelling. Bruising. How is this treated? Treatment [place] include: Cleaning your wound. Rinsing out (flushing) your wound. This uses saline solution, which is made of salt and water. Putting a bandage on your wound. Closing your wound with stitches (sutures), staples, skin glue, or skin tape (adhesive strips). Antibiotic medicine. You [place] be given pills, cream, gel, or fluid through an IV. A tetanus shot. Rabies treatment, if the animal could have rabies. Surgery, if there is infection or damage that needs to be fixed. Follow these instructions at home: Medicines Take or apply over-the-counter and prescription medicines only as told by your doctor. If you were prescribed an antibiotic medicine, take or apply it as told by your doctor. Do not stop using it even if your wound gets better. Wound care  Follow instructions from your doctor about how to take care of your wound. Make sure you: Wash your hands with soap and water for at least 20 seconds before and after you change your bandage. If you cannot use soap and water, use hand sanitizer. Change your bandage. Leave stitches or skin glue in place for at least 2 weeks. Leave tape strips alone unless you are told to take them off. You [place] trim the edges of the tape strips if they curl up.  Check your wound every day for signs of infection. Check for: More redness, swelling, or pain. More fluid or  blood. Warmth. Pus or a bad smell. General instructions  Raise (elevate) the injured area above the level of your heart while you are sitting or lying down. If told, put ice on the injured area. To do this: Put ice in a plastic bag. Place a towel between your skin and the bag. Leave the ice on for 20 minutes, 2-3 times per day. Take off the ice if your skin turns bright red. This is very important. If you cannot feel pain, heat, or cold, you have a greater risk of damage to the area. Keep all follow-up visits. Contact a doctor if: You have more redness, swelling, or pain around your wound. Your wound feels warm to the touch. You have a fever or chills. You have a general feeling of sickness (malaise). You feel like you [place] vomit. You vomit. You have pain that does not get better. Get help right away if: You have a red streak going away from your wound. You have any of these coming from your wound: Non-clear fluid. More blood. Pus or a bad smell. You have trouble moving your injured area. You lose feeling (have numbness) or feel tingling anywhere on your body. Summary Animal bites can be mild or serious. If a stray or wild animal bites you, you need to get medical help right away. Your doctor will look at the wound and [place] ask about how the animal bite happened. Treatment [place] include wound care, antibiotic medicine, a tetanus shot, and rabies treatment. This information is not intended to replace advice given to you by your health care provider. Make sure you discuss any questions you have with your health care provider. Document Revised: 09/08/2021 Document Reviewed: 09/08/2021 Elsevier Patient Education  [place], MD [facility]

## 2023-01-01 ENCOUNTER — Other Ambulatory Visit: Payer: Self-pay | Admitting: Internal Medicine

## 2023-02-12 ENCOUNTER — Encounter: Payer: Medicare PPO | Admitting: Internal Medicine

## 2023-02-18 ENCOUNTER — Other Ambulatory Visit: Payer: Self-pay | Admitting: Internal Medicine

## 2023-02-18 DIAGNOSIS — Z1231 Encounter for screening mammogram for malignant neoplasm of breast: Secondary | ICD-10-CM

## 2023-02-22 ENCOUNTER — Other Ambulatory Visit (INDEPENDENT_AMBULATORY_CARE_PROVIDER_SITE_OTHER): Payer: Medicare PPO

## 2023-02-22 DIAGNOSIS — I10 Essential (primary) hypertension: Secondary | ICD-10-CM

## 2023-02-22 DIAGNOSIS — E785 Hyperlipidemia, unspecified: Secondary | ICD-10-CM

## 2023-02-22 DIAGNOSIS — E119 Type 2 diabetes mellitus without complications: Secondary | ICD-10-CM

## 2023-02-22 LAB — LIPID PANEL
Cholesterol: 224 mg/dL — ABNORMAL HIGH (ref 0–200)
HDL: 58.9 mg/dL (ref >39.00)
LDL Cholesterol: 147 mg/dL — ABNORMAL HIGH (ref 0–99)
NonHDL: 164.77
Total CHOL/HDL Ratio: 4
Triglycerides: 91 mg/dL (ref 0.0–149.0)
VLDL: 18.2 mg/dL (ref 0.0–40.0)

## 2023-02-22 LAB — CBC WITH DIFFERENTIAL/PLATELET
Basophils Absolute: 0 10*3/uL (ref 0.0–0.1)
Basophils Relative: 0.6 % (ref 0.0–3.0)
Eosinophils Absolute: 0.1 10*3/uL (ref 0.0–0.7)
Eosinophils Relative: 1.4 % (ref 0.0–5.0)
HCT: 42.8 % (ref 36.0–46.0)
Hemoglobin: 14.1 g/dL (ref 12.0–15.0)
Lymphocytes Relative: 30.7 % (ref 12.0–46.0)
Lymphs Abs: 1.8 10*3/uL (ref 0.7–4.0)
MCHC: 33 g/dL (ref 30.0–36.0)
MCV: 88.7 fl (ref 78.0–100.0)
Monocytes Absolute: 0.6 10*3/uL (ref 0.1–1.0)
Monocytes Relative: 9.6 % (ref 3.0–12.0)
Neutro Abs: 3.3 10*3/uL (ref 1.4–7.7)
Neutrophils Relative %: 57.7 % (ref 43.0–77.0)
Platelets: 314 10*3/uL (ref 150.0–400.0)
RBC: 4.83 Mil/uL (ref 3.87–5.11)
RDW: 12.6 % (ref 11.5–15.5)
WBC: 5.8 10*3/uL (ref 4.0–10.5)

## 2023-02-22 LAB — URINALYSIS
Bilirubin Urine: NEGATIVE
Hgb urine dipstick: NEGATIVE
Ketones, ur: NEGATIVE
Leukocytes,Ua: NEGATIVE
Nitrite: NEGATIVE
Specific Gravity, Urine: 1.015 (ref 1.000–1.030)
Total Protein, Urine: NEGATIVE
Urine Glucose: NEGATIVE
Urobilinogen, UA: 0.2 (ref 0.0–1.0)
pH: 6 (ref 5.0–8.0)

## 2023-02-22 LAB — COMPREHENSIVE METABOLIC PANEL
ALT: 17 U/L (ref 0–35)
AST: 16 U/L (ref 0–37)
Albumin: 4 g/dL (ref 3.5–5.2)
Alkaline Phosphatase: 59 U/L (ref 39–117)
BUN: 14 mg/dL (ref 6–23)
CO2: 26 mEq/L (ref 19–32)
Calcium: 9.3 mg/dL (ref 8.4–10.5)
Chloride: 99 mEq/L (ref 96–112)
Creatinine, Ser: 0.63 mg/dL (ref 0.40–1.20)
GFR: 85.78 mL/min (ref >60.00)
Glucose, Bld: 93 mg/dL (ref 70–99)
Potassium: 3.8 mEq/L (ref 3.5–5.1)
Sodium: 135 mEq/L (ref 135–145)
Total Bilirubin: 0.5 mg/dL (ref 0.2–1.2)
Total Protein: 6.8 g/dL (ref 6.0–8.3)

## 2023-02-22 LAB — HEMOGLOBIN A1C: Hgb A1c MFr Bld: 5.7 % (ref 4.6–6.5)

## 2023-02-22 LAB — TSH: TSH: 0.97 u[IU]/mL (ref 0.35–5.50)

## 2023-02-25 ENCOUNTER — Encounter: Payer: Self-pay | Admitting: Internal Medicine

## 2023-02-25 ENCOUNTER — Ambulatory Visit (INDEPENDENT_AMBULATORY_CARE_PROVIDER_SITE_OTHER): Payer: Medicare PPO | Admitting: Internal Medicine

## 2023-02-25 VITALS — BP 122/84 | HR 72 | Temp 98.3°F | Ht 60.0 in | Wt 120.0 lb

## 2023-02-25 DIAGNOSIS — E119 Type 2 diabetes mellitus without complications: Secondary | ICD-10-CM | POA: Diagnosis not present

## 2023-02-25 DIAGNOSIS — I10 Essential (primary) hypertension: Secondary | ICD-10-CM

## 2023-02-25 DIAGNOSIS — E785 Hyperlipidemia, unspecified: Secondary | ICD-10-CM

## 2023-02-25 DIAGNOSIS — Z7984 Long term (current) use of oral hypoglycemic drugs: Secondary | ICD-10-CM

## 2023-02-25 DIAGNOSIS — E559 Vitamin D deficiency, unspecified: Secondary | ICD-10-CM | POA: Diagnosis not present

## 2023-02-25 NOTE — Assessment & Plan Note (Signed)
On diet and Metformin

## 2023-02-25 NOTE — Assessment & Plan Note (Signed)
Cont on Vascepa 

## 2023-02-25 NOTE — Assessment & Plan Note (Signed)
On Vit D 

## 2023-02-25 NOTE — Progress Notes (Signed)
Subjective:  Patient ID: Lisa [name], female    DOB: 20-Apr-1946  Age: 77 y.o. MRN: [id number]  CC: Follow-up (6 MNTH F/U)   HPI Lisa [name] presents for HTN, dyslipidemia, Vit D def  Outpatient Medications Prior to Visit  Medication Sig Dispense Refill   ascorbic acid (VITAMIN C) 500 MG tablet Take by mouth.     cholecalciferol (VITAMIN D) 1000 UNITS tablet Take 1,000 Units by mouth every 3 (three) days.     cyanocobalamin 1000 MCG tablet Take 1,000 mcg by mouth daily.      Glucosamine HCl 500 MG TABS Take by mouth 2 (two) times daily.      lisinopril-hydrochlorothiazide (ZESTORETIC) 10-12.5 MG tablet TAKE 1 TABLET BY MOUTH EVERY DAY 90 tablet 3   metFORMIN (GLUCOPHAGE) 500 MG tablet TAKE 1 TABLET BY MOUTH EVERY DAY 90 tablet 2   SODIUM FLUORIDE 5000 PPM 1.1 % PSTE Take by mouth.     Turmeric 500 MG TABS Take 2 capsules by mouth 2 (two) times daily.     VASCEPA 1 g capsule TAKE 2 CAPSULES BY MOUTH 2 TIMES DAILY. 360 capsule 3   omega-3 acid ethyl esters (LOVAZA) 1 g capsule Take by mouth.     No facility-administered medications prior to visit.    ROS: Review of Systems  Constitutional:  Negative for activity change, appetite change, chills, fatigue and unexpected weight change.  HENT:  Negative for congestion, mouth sores and sinus pressure.   Eyes:  Negative for visual disturbance.  Respiratory:  Negative for cough and chest tightness.   Gastrointestinal:  Negative for abdominal pain and nausea.  Genitourinary:  Negative for difficulty urinating, frequency and vaginal pain.  Musculoskeletal:  Negative for back pain and gait problem.  Skin:  Negative for pallor and rash.  Neurological:  Negative for dizziness, tremors, weakness, numbness and headaches.  Psychiatric/Behavioral:  Negative for confusion and sleep disturbance.     Objective:  BP 122/84 (BP Location: Right Arm, Patient Position: Sitting, Cuff Size: Large)   Pulse 72   Temp 98.3 F (36.8 C) (Oral)   Ht  5' (1.524 m)   Wt 120 lb (54.4 kg)   SpO2 94%   BMI 23.44 kg/m   BP Readings from Last 3 Encounters:  02/25/23 122/84  12/04/22 128/78  08/13/22 126/78    Wt Readings from Last 3 Encounters:  02/25/23 120 lb (54.4 kg)  12/04/22 119 lb (54 kg)  08/13/22 119 lb (54 kg)    Physical Exam Constitutional:      General: She is not in acute distress.    Appearance: Normal appearance. She is well-developed.  HENT:     Head: Normocephalic.     Right Ear: External ear normal.     Left Ear: External ear normal.     Nose: Nose normal.  Eyes:     General:        Right eye: No discharge.        Left eye: No discharge.     Conjunctiva/sclera: Conjunctivae normal.     Pupils: Pupils are equal, round, and reactive to light.  Neck:     Thyroid: No thyromegaly.     Vascular: No JVD.     Trachea: No tracheal deviation.  Cardiovascular:     Rate and Rhythm: Normal rate and regular rhythm.     Heart sounds: Normal heart sounds.  Pulmonary:     Effort: No respiratory distress.     Breath sounds: No stridor.  No wheezing.  Abdominal:     General: Bowel sounds are normal. There is no distension.     Palpations: Abdomen is soft. There is no mass.     Tenderness: There is no abdominal tenderness. There is no guarding or rebound.  Musculoskeletal:        General: No tenderness.     Cervical back: Normal range of motion and neck supple. No rigidity.  Lymphadenopathy:     Cervical: No cervical adenopathy.  Skin:    Findings: No erythema or rash.  Neurological:     Cranial Nerves: No cranial nerve deficit.     Motor: No abnormal muscle tone.     Coordination: Coordination normal.     Deep Tendon Reflexes: Reflexes normal.  Psychiatric:        Behavior: Behavior normal.        Thought Content: Thought content normal.        Judgment: Judgment normal.     Lab Results  Component Value Date   WBC 5.8 02/22/2023   HGB 14.1 02/22/2023   HCT 42.8 02/22/2023   PLT 314.0 02/22/2023    GLUCOSE 93 02/22/2023   CHOL 224 (H) 02/22/2023   TRIG 91.0 02/22/2023   HDL 58.90 02/22/2023   LDLDIRECT 154.0 10/09/2021   LDLCALC 147 (H) 02/22/2023   ALT 17 02/22/2023   AST 16 02/22/2023   NA 135 02/22/2023   K 3.8 02/22/2023   CL 99 02/22/2023   CREATININE 0.63 02/22/2023   BUN 14 02/22/2023   CO2 26 02/22/2023   TSH 0.97 02/22/2023   HGBA1C 5.7 02/22/2023    MM 3D SCREEN BREAST BILATERAL  Result Date: 02/14/2022 CLINICAL DATA:  Screening. EXAM: DIGITAL SCREENING BILATERAL MAMMOGRAM WITH TOMOSYNTHESIS AND CAD TECHNIQUE: Bilateral screening digital craniocaudal and mediolateral oblique mammograms were obtained. Bilateral screening digital breast tomosynthesis was performed. The images were evaluated with computer-aided detection. COMPARISON:  Previous exam(s). ACR Breast Density Category b: There are scattered areas of fibroglandular density. FINDINGS: There are no findings suspicious for malignancy. IMPRESSION: No mammographic evidence of malignancy. A result letter of this screening mammogram will be mailed directly to the patient. RECOMMENDATION: Screening mammogram in one year. (Code:SM-B-01Y) BI-RADS CATEGORY  1: Negative. Electronically Signed   By: Sherron Ales M.D.   On: 02/14/2022 11:12    Assessment & Plan:   Problem List Items Addressed This Visit     Vitamin D deficiency - Primary    On Vit D      Dyslipidemia     Cont on  Vascepa      Relevant Orders   TSH   Urinalysis   CBC with Differential/Platelet   Lipid panel   Comprehensive metabolic panel   Essential hypertension    Cont on Prinizide  CT coronary calcium score 0      Relevant Orders   TSH   Urinalysis   CBC with Differential/Platelet   Lipid panel   Comprehensive metabolic panel   Diabetes mellitus type 2 without retinopathy (HCC)    On diet and Metformin      Relevant Orders   TSH   Urinalysis   CBC with Differential/Platelet   Lipid panel   Comprehensive metabolic panel       No orders of the defined types were placed in this encounter.     Follow-up: Return in about 6 months (around 08/27/2023) for Wellness Exam.  Sonda Primes, MD

## 2023-02-25 NOTE — Assessment & Plan Note (Signed)
Cont on Prinizide  CT coronary calcium score 0

## 2023-03-14 ENCOUNTER — Ambulatory Visit
Admission: RE | Admit: 2023-03-14 | Discharge: 2023-03-14 | Disposition: A | Discharge status: Home / Self Care | Payer: Medicare PPO | Source: Ambulatory Visit | Attending: Internal Medicine | Admitting: Internal Medicine

## 2023-03-14 DIAGNOSIS — Z1231 Encounter for screening mammogram for malignant neoplasm of breast: Secondary | ICD-10-CM | POA: Diagnosis not present

## 2023-05-30 DIAGNOSIS — E119 Type 2 diabetes mellitus without complications: Secondary | ICD-10-CM | POA: Diagnosis not present

## 2023-05-30 DIAGNOSIS — Z961 Presence of intraocular lens: Secondary | ICD-10-CM | POA: Diagnosis not present

## 2023-05-30 DIAGNOSIS — H26493 Other secondary cataract, bilateral: Secondary | ICD-10-CM | POA: Diagnosis not present

## 2023-05-30 DIAGNOSIS — H35343 Macular cyst, hole, or pseudohole, bilateral: Secondary | ICD-10-CM | POA: Diagnosis not present

## 2023-08-22 ENCOUNTER — Other Ambulatory Visit: Payer: Medicare PPO

## 2023-08-22 DIAGNOSIS — E119 Type 2 diabetes mellitus without complications: Secondary | ICD-10-CM

## 2023-08-22 DIAGNOSIS — R739 Hyperglycemia, unspecified: Secondary | ICD-10-CM

## 2023-08-22 DIAGNOSIS — I10 Essential (primary) hypertension: Secondary | ICD-10-CM | POA: Diagnosis not present

## 2023-08-22 DIAGNOSIS — E785 Hyperlipidemia, unspecified: Secondary | ICD-10-CM

## 2023-08-22 LAB — BASIC METABOLIC PANEL
BUN: 13 mg/dL (ref 6–23)
CO2: 29 meq/L (ref 19–32)
Calcium: 9.5 mg/dL (ref 8.4–10.5)
Chloride: 103 meq/L (ref 96–112)
Creatinine, Ser: 0.6 mg/dL (ref 0.40–1.20)
GFR: 86.49 mL/min (ref >60.00)
Glucose, Bld: 100 mg/dL — ABNORMAL HIGH (ref 70–99)
Potassium: 3.9 meq/L (ref 3.5–5.1)
Sodium: 138 meq/L (ref 135–145)

## 2023-08-22 LAB — URINALYSIS, ROUTINE W REFLEX MICROSCOPIC
Bilirubin Urine: NEGATIVE
Hgb urine dipstick: NEGATIVE
Ketones, ur: NEGATIVE
Nitrite: NEGATIVE
RBC / HPF: NONE SEEN (ref 0–?)
Specific Gravity, Urine: 1.01 (ref 1.000–1.030)
Total Protein, Urine: NEGATIVE
Urine Glucose: NEGATIVE
Urobilinogen, UA: 0.2 (ref 0.0–1.0)
pH: 7 (ref 5.0–8.0)

## 2023-08-22 LAB — CBC WITH DIFFERENTIAL/PLATELET
Basophils Absolute: 0 10*3/uL (ref 0.0–0.1)
Basophils Relative: 0.9 % (ref 0.0–3.0)
Eosinophils Absolute: 0.1 10*3/uL (ref 0.0–0.7)
Eosinophils Relative: 1.7 % (ref 0.0–5.0)
HCT: 43.1 % (ref 36.0–46.0)
Hemoglobin: 14.3 g/dL (ref 12.0–15.0)
Lymphocytes Relative: 41.1 % (ref 12.0–46.0)
Lymphs Abs: 2.1 10*3/uL (ref 0.7–4.0)
MCHC: 33.3 g/dL (ref 30.0–36.0)
MCV: 89.7 fL (ref 78.0–100.0)
Monocytes Absolute: 0.5 10*3/uL (ref 0.1–1.0)
Monocytes Relative: 9.5 % (ref 3.0–12.0)
Neutro Abs: 2.4 10*3/uL (ref 1.4–7.7)
Neutrophils Relative %: 46.8 % (ref 43.0–77.0)
Platelets: 297 10*3/uL (ref 150.0–400.0)
RBC: 4.8 Mil/uL (ref 3.87–5.11)
RDW: 13.3 % (ref 11.5–15.5)
WBC: 5.2 10*3/uL (ref 4.0–10.5)

## 2023-08-22 LAB — LIPID PANEL
Cholesterol: 247 mg/dL — ABNORMAL HIGH (ref 0–200)
HDL: 53.1 mg/dL (ref >39.00)
LDL Cholesterol: 167 mg/dL — ABNORMAL HIGH (ref 0–99)
NonHDL: 194.04
Total CHOL/HDL Ratio: 5
Triglycerides: 136 mg/dL (ref 0.0–149.0)
VLDL: 27.2 mg/dL (ref 0.0–40.0)

## 2023-08-22 LAB — COMPREHENSIVE METABOLIC PANEL
ALT: 17 U/L (ref 0–35)
AST: 16 U/L (ref 0–37)
Albumin: 4.1 g/dL (ref 3.5–5.2)
Alkaline Phosphatase: 64 U/L (ref 39–117)
BUN: 13 mg/dL (ref 6–23)
CO2: 29 meq/L (ref 19–32)
Calcium: 9.5 mg/dL (ref 8.4–10.5)
Chloride: 103 meq/L (ref 96–112)
Creatinine, Ser: 0.6 mg/dL (ref 0.40–1.20)
GFR: 86.49 mL/min (ref >60.00)
Glucose, Bld: 100 mg/dL — ABNORMAL HIGH (ref 70–99)
Potassium: 3.9 meq/L (ref 3.5–5.1)
Sodium: 138 meq/L (ref 135–145)
Total Bilirubin: 0.6 mg/dL (ref 0.2–1.2)
Total Protein: 6.9 g/dL (ref 6.0–8.3)

## 2023-08-22 LAB — TSH: TSH: 0.89 u[IU]/mL (ref 0.35–5.50)

## 2023-08-22 LAB — HEMOGLOBIN A1C: Hgb A1c MFr Bld: 5.8 % (ref 4.6–6.5)

## 2023-08-26 ENCOUNTER — Ambulatory Visit: Payer: Medicare PPO

## 2023-08-26 VITALS — Ht 60.0 in | Wt 120.0 lb

## 2023-08-26 DIAGNOSIS — E119 Type 2 diabetes mellitus without complications: Secondary | ICD-10-CM

## 2023-08-26 DIAGNOSIS — Z0000 Encounter for general adult medical examination without abnormal findings: Secondary | ICD-10-CM | POA: Diagnosis not present

## 2023-08-26 DIAGNOSIS — Z7984 Long term (current) use of oral hypoglycemic drugs: Secondary | ICD-10-CM

## 2023-08-26 NOTE — Progress Notes (Cosign Needed Addendum)
Subjective:   Lisa [name] is a 77 y.o. female who presents for Medicare Annual (Subsequent) preventive examination.  Visit Complete: Virtual I connected with  Young Berry Vonbehren on 08/26/23 by a audio enabled telemedicine application and verified that I am speaking with the correct person using two identifiers.  Patient Location: Home  Provider Location: Office/Clinic  I discussed the limitations of evaluation and management by telemedicine. The patient expressed understanding and agreed to proceed.  Vital Signs: Because this visit was a virtual/telehealth visit, some criteria [place] be missing or patient reported. Any vitals not documented were not able to be obtained and vitals that have been documented are patient reported.   Cardiac Risk Factors include: advanced age (>43men, >38 women);hypertension;diabetes mellitus;dyslipidemia     Objective:    Today's Vitals   08/26/23 0903  Weight: 120 lb (54.4 kg)  Height: 5' (1.524 m)   Body mass index is 23.44 kg/m.     08/26/2023    9:13 AM 06/25/2022    8:29 AM 04/25/2021    9:00 AM 12/15/2020    8:55 AM 08/04/2019    8:36 AM 07/02/2019    9:29 AM 06/25/2017   10:29 AM  Advanced Directives  Does Patient Have a Medical Advance Directive? No No No No No No No  Would patient like information on creating a medical advance directive?  No - Patient declined No - Patient declined Yes (MAU/Ambulatory/Procedural Areas - Information given) No - Patient declined  Yes (ED - Information included in AVS)    Current Medications (verified) Outpatient Encounter Medications as of 08/26/2023  Medication Sig   ascorbic acid (VITAMIN C) 500 MG tablet Take by mouth.   cholecalciferol (VITAMIN D) 1000 UNITS tablet Take 1,000 Units by mouth every 3 (three) days.   cyanocobalamin 1000 MCG tablet Take 1,000 mcg by mouth daily.    Glucosamine HCl 500 MG TABS Take by mouth 2 (two) times daily.    lisinopril-hydrochlorothiazide (ZESTORETIC) 10-12.5 MG  tablet TAKE 1 TABLET BY MOUTH EVERY DAY   metFORMIN (GLUCOPHAGE) 500 MG tablet TAKE 1 TABLET BY MOUTH EVERY DAY   SODIUM FLUORIDE 5000 PPM 1.1 % PSTE Take by mouth.   Turmeric 500 MG TABS Take 2 capsules by mouth 2 (two) times daily.   VASCEPA 1 g capsule TAKE 2 CAPSULES BY MOUTH 2 TIMES DAILY.   No facility-administered encounter medications on file as of 08/26/2023.    Allergies (verified) Atorvastatin, Ezetimibe-simvastatin, Fish oil, Livalo [pitavastatin calcium], Lovastatin, Rosuvastatin, and Zetia [ezetimibe]   History: Past Medical History:  Diagnosis Date   Arthritis    Cataract 2009   right-Dr. Nile Riggs   Hyperlipidemia    Hypertension    Post-operative nausea and vomiting    Retinal tear 2008   B- Dr. Gregery Na   Vitamin D deficiency    Past Surgical History:  Procedure Laterality Date   APPENDECTOMY  1963   CATARACT EXTRACTION     Dr. Nile Riggs   POLYPECTOMY     RETINAL TEAR REPAIR CRYOTHERAPY     Dr. 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 every week per pt   Drug use: No   Sexual activity: Not Currently  Other Topics Concern   Not on file  Social History Narrative   Regular exercise- Yes   Lives alone.   Social Determinants of Health   Financial Resource Strain: Low Risk  (08/26/2023)   Overall Financial Resource  Strain (CARDIA)    Difficulty of Paying Living Expenses: Not very hard  Food Insecurity: No Food Insecurity (08/26/2023)   Hunger Vital Sign    Worried About Running Out of Food in the Last Year: Never true    Ran Out of Food in the Last Year: Never true  Transportation Needs: No Transportation Needs (08/26/2023)   PRAPARE - Administrator, Civil Service (Medical): No    Lack of Transportation (Non-Medical): No  Physical Activity: Inactive (08/26/2023)   Exercise Vital Sign    Days of Exercise per Week: 0 days    Minutes of Exercise per Session: 0 min  Stress: Stress Concern Present (08/26/2023)   [employer] of Occupational Health - Occupational Stress Questionnaire    Feeling of Stress : To some extent  Social Connections: Moderately Integrated (08/26/2023)   Social Connection and Isolation Panel [NHANES]    Frequency of Communication with Friends and Family: More than three times a week    Frequency of Social Gatherings with Friends and Family: Three times a week    Attends Religious Services: More than 4 times per year    Active Member of Clubs or Organizations: Yes    Attends Banker Meetings: Never    Marital Status: Never married    Tobacco Counseling Counseling given: Not Answered   Clinical Intake:  Pre-visit preparation completed: Yes  Pain : No/denies pain     BMI - recorded: 23.44 Nutritional Status: BMI of 19-24  Normal Nutritional Risks: None Diabetes: Yes CBG done?: No Did pt. bring in CBG monitor from home?: No  How often do you need to have someone help you when you read instructions, pamphlets, or other written materials from your doctor or pharmacy?: 1 - Never  Interpreter Needed?: No  Information entered by :: Sydelle Sherfield, RMA   Activities of Daily Living    08/26/2023    9:05 AM  In your present state of health, do you have any difficulty performing the following activities:  Hearing? 0  Vision? 0  Difficulty  concentrating or making decisions? 0  Walking or climbing stairs? 0  Dressing or bathing? 0  Doing errands, shopping? 0  Preparing Food and eating ? N  Using the Toilet? N  In the past six months, have you accidently leaked urine? N  Do you have problems with loss of bowel control? N  Managing your Medications? N  Managing your Finances? N  Housekeeping or managing your Housekeeping? N    Patient Care Team: [name], Georgina Quint, MD as PCP - General Tressie Stalker, MD (Neurosurgery) Elliot Cousin, [place] as Referring Physician (Optometry) Marciano Sequin, MD (Gynecology) Hilarie Fredrickson, MD as Consulting Physician (Gastroenterology) Gwendalyn Ege,  Tasia Catchings, MD as Referring Physician (Ophthalmology)  Indicate any recent Medical Services you Devincent have received from other than [facility] providers in the past year (date Hoeppner be approximate).     Assessment:   This is a routine wellness examination for [facility].  Hearing/Vision screen Hearing Screening - Comments:: Denies hearing difficulties   Vision Screening - Comments:: Wears eyeglasses.    Goals Addressed             This Visit's Progress    COMPLETED: Maintain current health       Be as healthy and as independent as possible, increase muscle strength, perhaps work with a trainer to increase muscle strength or join [employer].   Still working on it.      Depression Screen    08/26/2023    9:24 AM 12/04/2022    2:48 PM 08/13/2022    8:26 AM 06/25/2022    8:28 AM 01/01/2022    9:00 AM 04/25/2021    9:00 AM 12/15/2020    8:54 AM  PHQ 2/9 Scores  PHQ - 2 Score 0 0 0 0 0 0 0  PHQ- 9 Score 0          Fall Risk    08/26/2023    9:13 AM 12/04/2022    2:48 PM 08/13/2022    8:26 AM 06/25/2022    8:24 AM 01/01/2022    9:00 AM  Fall Risk   Falls in the past year? 1 0 0 0 --  Comment     denies new/ recent falls x 12 months; does not use assistive devices  Number falls in past yr: 0 0 0  0  Injury with Fall? 0 0 0  0  Risk for fall  due to : No Fall Risks No Fall Risks No Fall Risks  History of fall(s)  Follow up Falls prevention discussed;Falls evaluation completed Falls evaluation completed Falls evaluation completed  Falls prevention discussed    MEDICARE RISK AT HOME: Medicare Risk at Home Any stairs in or around the home?: Yes (basement) If so, are there any without handrails?: Yes Home free of loose throw rugs in walkways, pet beds, electrical cords, etc?: Yes Adequate lighting in your home to reduce risk of falls?: Yes Life alert?: No Use of a cane, walker or w/c?: No Grab bars in the bathroom?: No Shower chair or bench in shower?: Yes Elevated toilet seat or a handicapped toilet?: No  TIMED UP AND GO:  Was the test performed?  No    Cognitive Function:    06/25/2017   10:51 AM  MMSE - Mini Mental State Exam  Orientation to time 5  Orientation to Place 5  Registration 3  Attention/ Calculation 5  Recall 3  Language- name 2 objects 2  Language- repeat 1  Language- follow 3 step command 3  Language- read & follow direction 1  Write a sentence 1  Copy design 1  Total score 30        08/26/2023    9:11 AM 06/25/2022    8:30 AM  6CIT Screen  What Year? 0 points 0 points  What month? 0 points 0 points  What time? 0 points 0 points  Count back from 20 0 points 0 points  Months in reverse 0 points 0 points  Repeat phrase 0 points 0 points  Total Score 0 points 0 points    Immunizations Immunization History  Administered Date(s) Administered   Fluad Quad(high Dose 65+) 06/17/2020, 06/20/2022,  07/16/2023   Hepatitis A, Adult 06/02/2018   Influenza Split 07/11/2012   Influenza, High Dose Seasonal PF 06/20/2017, 06/20/2018, 05/28/2019   Influenza,inj,Quad PF,6+ Mos 05/28/2016   Influenza-Unspecified 10/14/2013, 06/17/2014, 06/18/2015, 07/13/2021   PFIZER(Purple Top)SARS-COV-2 Vaccination 10/06/2019, 10/27/2019, 06/24/2020   Pneumococcal Conjugate-13 09/17/2008   Pneumococcal  Polysaccharide-23 11/02/2014   Td 10/14/2013   Zoster, Live 03/10/2010    TDAP status: Up to date  Flu Vaccine status: Due, Education has been provided regarding the importance of this vaccine. Advised [place] receive this vaccine at local pharmacy or Health Dept. Aware to provide a copy of the vaccination record if obtained from local pharmacy or Health Dept. Verbalized acceptance and understanding.  Pneumococcal vaccine status: Up to date  Covid-19 vaccine status: Information provided on how to obtain vaccines.   Qualifies for Shingles Vaccine? Yes   Zostavax completed Yes   Shingrix Completed?: No.    Education has been provided regarding the importance of this vaccine. Patient has been advised to call insurance company to determine out of pocket expense if they have not yet received this vaccine. Advised [place] also receive vaccine at local pharmacy or Health Dept. Verbalized acceptance and understanding.  Screening Tests Health Maintenance  Topic Date Due   Diabetic kidney evaluation - Urine ACR  Never done   FOOT EXAM  08/28/2023 (Originally 01/30/1956)   COVID-19 Vaccine (4 - 2023-24 season) 09/17/2023 (Originally 05/19/2023)   DTaP/Tdap/Td (2 - Tdap) 10/15/2023   HEMOGLOBIN A1C  02/20/2024   OPHTHALMOLOGY EXAM  05/29/2024   Diabetic kidney evaluation - eGFR measurement  08/21/2024   Medicare Annual Wellness (AWV)  08/25/2024   Pneumonia Vaccine 36+ Years old  Completed   INFLUENZA VACCINE  Completed   DEXA SCAN  Completed   Hepatitis C Screening  Completed   HPV VACCINES  Aged Out   Zoster Vaccines- Shingrix  Discontinued    Health Maintenance  Health Maintenance Due  Topic Date Due   Diabetic kidney evaluation - Urine ACR  Never done    Colorectal cancer screening: No longer required.   Mammogram status: Completed 03/18/2023. Repeat every year  Bone Density status: Completed 07/24/2017. Results reflect: Bone density results: OSTEOPENIA. Repeat every 2 years.  Lung  Cancer Screening: (Low Dose CT Chest recommended if Age 56-80 years, 20 pack-year currently smoking OR have quit w/in 15years.) does not qualify.   Lung Cancer Screening Referral: N/A  Additional Screening:  Hepatitis C Screening: does qualify; Completed 06/26/2017  Vision Screening: Recommended annual ophthalmology exams for early detection of glaucoma and other disorders of the eye. Is the patient up to date with their annual eye exam?  Yes  Who is the provider or what is the name of the office in which the patient attends annual eye exams? Dr. Gwendalyn Ege If pt is not established with a provider, would they like to be referred to a provider to establish care? No .   Dental Screening: Recommended annual dental exams for proper oral hygiene  Diabetic Foot Exam: Diabetic Foot Exam: Overdue, Pt has been advised about the importance in completing this exam. Pt is scheduled for diabetic foot exam on 08/27/2023.  Community Resource Referral / Chronic Care Management: CRR required this visit?  No   CCM required this visit?  No     Plan:     I have personally reviewed and noted the following in the patient's chart:   Medical and social history Use of alcohol, tobacco or illicit drugs  Current medications and supplements  including opioid prescriptions. Patient is not currently taking opioid prescriptions. Functional ability and status Nutritional status Physical activity Advanced directives List of other physicians Hospitalizations, surgeries, and ER visits in previous 12 months Vitals Screenings to include cognitive, depression, and falls Referrals and appointments  In addition, I have reviewed and discussed with patient certain preventive protocols, quality metrics, and best practice recommendations. A written personalized care plan for preventive services as well as general preventive health recommendations were provided to patient.     Daleena Rotter L Nastasia Kage, CMA   08/26/2023   After  Visit Summary: (MyChart) Due to this being a telephonic visit, the after visit summary with patients personalized plan was offered to patient via MyChart    Nurse Notes: Patient is due for a foot exam, which she has an appointment tomorrow with Dr. Posey Rea.  Patient is due for the latest Covid vaccine  She is also due for a UACR, however, patient stated that she has already had one of those, last week.  Patient had no concerns to address today.     Medical screening examination/treatment/procedure(s) were performed by non-physician practitioner and as supervising physician I was immediately available for consultation/collaboration.  I agree with above. Jacinta Shoe, MD

## 2023-08-26 NOTE — Patient Instructions (Signed)
Ms. Janis , Thank you for taking time to come for your Medicare Wellness Visit. I appreciate your ongoing commitment to your health goals. Please review the following plan we discussed and let me know if I can assist you in the future.   Referrals/Orders/Follow-Ups/Clinician Recommendations: You are due for a foot exam during your office visit tomorrow.  Remember to get your latest Covid vaccine at your pharmacy soon.  It was nice talking with you today.  Keep up the good work  This is a list of the screening recommended for you and due dates:  Health Maintenance  Topic Date Due   Yearly kidney health urinalysis for diabetes  Never done   Complete foot exam   08/28/2023*   COVID-19 Vaccine (4 - 2023-24 season) 09/17/2023*   DTaP/Tdap/Td vaccine (2 - Tdap) 10/15/2023   Hemoglobin A1C  02/20/2024   Eye exam for diabetics  05/29/2024   Yearly kidney function blood test for diabetes  08/21/2024   Medicare Annual Wellness Visit  08/25/2024   Pneumonia Vaccine  Completed   Flu Shot  Completed   DEXA scan (bone density measurement)  Completed   Hepatitis C Screening  Completed   HPV Vaccine  Aged Out   Zoster (Shingles) Vaccine  Discontinued  *Topic was postponed. The date shown is not the original due date.    Advanced directives: (Declined) Advance directive discussed with you today. Even though you declined this today, please call our office should you change your mind, and we can give you the proper paperwork for you to fill out.  Next Medicare Annual Wellness Visit scheduled for next year: Yes

## 2023-08-27 ENCOUNTER — Ambulatory Visit: Payer: Medicare PPO | Admitting: Internal Medicine

## 2023-08-27 ENCOUNTER — Encounter: Payer: Self-pay | Admitting: Internal Medicine

## 2023-08-27 VITALS — BP 160/85 | HR 74 | Temp 98.0°F | Ht 59.0 in | Wt 122.0 lb

## 2023-08-27 DIAGNOSIS — M25511 Pain in right shoulder: Secondary | ICD-10-CM

## 2023-08-27 DIAGNOSIS — Z7984 Long term (current) use of oral hypoglycemic drugs: Secondary | ICD-10-CM | POA: Diagnosis not present

## 2023-08-27 DIAGNOSIS — E119 Type 2 diabetes mellitus without complications: Secondary | ICD-10-CM

## 2023-08-27 DIAGNOSIS — I10 Essential (primary) hypertension: Secondary | ICD-10-CM

## 2023-08-27 DIAGNOSIS — M25512 Pain in left shoulder: Secondary | ICD-10-CM

## 2023-08-27 MED ORDER — ICOSAPENT ETHYL 1 G PO CAPS: 2.0000 g | ORAL_CAPSULE | Freq: Two times a day (BID) | ORAL | 3 refills | Status: DC

## 2023-08-27 MED ORDER — MELOXICAM 7.5 MG PO TABS: 7.5000 mg | ORAL_TABLET | Freq: Every day | ORAL | 0 refills | Status: DC | PRN

## 2023-08-27 MED ORDER — METFORMIN HCL 500 MG PO TABS: 500.0000 mg | ORAL_TABLET | Freq: Every day | ORAL | 2 refills | Status: DC

## 2023-08-27 MED ORDER — LISINOPRIL-HYDROCHLOROTHIAZIDE 10-12.5 MG PO TABS: 1.0000 | ORAL_TABLET | Freq: Every day | ORAL | 3 refills | Status: DC

## 2023-08-27 NOTE — Assessment & Plan Note (Signed)
Monitor A1c On diet and Metformin

## 2023-08-27 NOTE — Assessment & Plan Note (Signed)
Worse Labs incl CK, ESR Sports Med ref Meloxicam low dose prn pc

## 2023-08-27 NOTE — Assessment & Plan Note (Addendum)
Cont on Prinizide CT coronary calcium score 0 Re-check BP at home RTC 6 wks if BP is up

## 2023-08-27 NOTE — Progress Notes (Signed)
Subjective:  Patient ID: Lisa [name], female    DOB: [birth year]  Age: 77 y.o. MRN: [id number]  CC: Medical Management of Chronic Issues (6 month follow up, Arthritis, patient also wants to discuss what she can do to regain muscle mass (Her Arms))   HPI [place] Camey presents for HTN, dyslipidemia  F/u on arthritis, patient also wants to discuss what she can do to regain muscle mass (Her Arms)). Started after COVID. B shoulders hurt  Outpatient Medications Prior to Visit  Medication Sig Dispense Refill   ascorbic acid (VITAMIN C) 500 MG tablet Take by mouth.     cholecalciferol (VITAMIN D) 1000 UNITS tablet Take 1,000 Units by mouth every 3 (three) days.     cyanocobalamin 1000 MCG tablet Take 1,000 mcg by mouth daily.      Glucosamine HCl 500 MG TABS Take by mouth 2 (two) times daily.      SODIUM FLUORIDE 5000 PPM 1.1 % PSTE Take by mouth.     Turmeric 500 MG TABS Take 2 capsules by mouth 2 (two) times daily.     lisinopril-hydrochlorothiazide (ZESTORETIC) 10-12.5 MG tablet TAKE 1 TABLET BY MOUTH EVERY DAY 90 tablet 3   metFORMIN (GLUCOPHAGE) 500 MG tablet TAKE 1 TABLET BY MOUTH EVERY DAY 90 tablet 2   VASCEPA 1 g capsule TAKE 2 CAPSULES BY MOUTH 2 TIMES DAILY. 360 capsule 3   No facility-administered medications prior to visit.    ROS: Review of Systems  Constitutional:  Negative for activity change, appetite change, chills, fatigue and unexpected weight change.  HENT:  Negative for congestion, mouth sores and sinus pressure.   Eyes:  Negative for visual disturbance.  Respiratory:  Negative for cough and chest tightness.   Gastrointestinal:  Negative for abdominal pain and nausea.  Genitourinary:  Negative for difficulty urinating, frequency and vaginal pain.  Musculoskeletal:  Positive for arthralgias and myalgias. Negative for back pain and gait problem.  Skin:  Negative for pallor and rash.  Neurological:  Negative for dizziness, tremors, weakness, numbness and  headaches.  Psychiatric/Behavioral:  Negative for confusion and sleep disturbance.     Objective:  BP (!) 160/85   Pulse 74   Temp 98 F (36.7 C) (Oral)   Ht 4\' 11"  (1.499 m)   Wt 122 lb (55.3 kg)   SpO2 98%   BMI 24.64 kg/m   BP Readings from Last 3 Encounters:  08/27/23 (!) 160/85  02/25/23 122/84  12/04/22 128/78    Wt Readings from Last 3 Encounters:  08/27/23 122 lb (55.3 kg)  08/26/23 120 lb (54.4 kg)  02/25/23 120 lb (54.4 kg)    Physical Exam Constitutional:      General: She is not in acute distress.    Appearance: Normal appearance. She is well-developed.  HENT:     Head: Normocephalic.     Right Ear: External ear normal.     Left Ear: External ear normal.     Nose: Nose normal.  Eyes:     General:        Right eye: No discharge.        Left eye: No discharge.     Conjunctiva/sclera: Conjunctivae normal.     Pupils: Pupils are equal, round, and reactive to light.  Neck:     Thyroid: No thyromegaly.     Vascular: No JVD.     Trachea: No tracheal deviation.  Cardiovascular:     Rate and Rhythm: Normal rate and regular rhythm.  Heart sounds: Normal heart sounds.  Pulmonary:     Effort: No respiratory distress.     Breath sounds: No stridor. No wheezing.  Abdominal:     General: Bowel sounds are normal. There is no distension.     Palpations: Abdomen is soft. There is no mass.     Tenderness: There is no abdominal tenderness. There is no guarding or rebound.  Musculoskeletal:        General: No tenderness.     Cervical back: Normal range of motion and neck supple. No rigidity.  Lymphadenopathy:     Cervical: No cervical adenopathy.  Skin:    Findings: No erythema or rash.  Neurological:     Cranial Nerves: No cranial nerve deficit.     Motor: No abnormal muscle tone.     Coordination: Coordination normal.     Deep Tendon Reflexes: Reflexes normal.  Psychiatric:        Behavior: Behavior normal.        Thought Content: Thought content  normal.        Judgment: Judgment normal.   B shoulders - pain on ROM Feet - WNL  Lab Results  Component Value Date   WBC 5.2 08/22/2023   HGB 14.3 08/22/2023   HCT 43.1 08/22/2023   PLT 297.0 08/22/2023   GLUCOSE 100 (H) 08/22/2023   GLUCOSE 100 (H) 08/22/2023   CHOL 247 (H) 08/22/2023   TRIG 136.0 08/22/2023   HDL 53.10 08/22/2023   LDLDIRECT 154.0 10/09/2021   LDLCALC 167 (H) 08/22/2023   ALT 17 08/22/2023   AST 16 08/22/2023   NA 138 08/22/2023   NA 138 08/22/2023   K 3.9 08/22/2023   K 3.9 08/22/2023   CL 103 08/22/2023   CL 103 08/22/2023   CREATININE 0.60 08/22/2023   CREATININE 0.60 08/22/2023   BUN 13 08/22/2023   BUN 13 08/22/2023   CO2 29 08/22/2023   CO2 29 08/22/2023   TSH 0.89 08/22/2023   HGBA1C 5.8 08/22/2023    MM 3D SCREENING MAMMOGRAM BILATERAL BREAST  Result Date: 03/18/2023 CLINICAL DATA:  Screening. EXAM: DIGITAL SCREENING BILATERAL MAMMOGRAM WITH TOMOSYNTHESIS AND CAD TECHNIQUE: Bilateral screening digital craniocaudal and mediolateral oblique mammograms were obtained. Bilateral screening digital breast tomosynthesis was performed. The images were evaluated with computer-aided detection. COMPARISON:  Previous exam(s). ACR Breast Density Category b: There are scattered areas of fibroglandular density. FINDINGS: There are no findings suspicious for malignancy. IMPRESSION: No mammographic evidence of malignancy. A result letter of this screening mammogram will be mailed directly to the patient. RECOMMENDATION: Screening mammogram in one year. (Code:SM-B-01Y) BI-RADS CATEGORY  1: Negative. Electronically Signed   By: Amie Portland M.D.   On: 03/18/2023 13:22    Assessment & Plan:   Problem List Items Addressed This Visit     Essential hypertension - Primary    Cont on Prinizide CT coronary calcium score 0 Re-check BP at home RTC 6 wks if BP is up      Relevant Medications   lisinopril-hydrochlorothiazide (ZESTORETIC) 10-12.5 MG tablet    icosapent Ethyl (VASCEPA) 1 g capsule   Other Relevant Orders   Comprehensive metabolic panel   Diabetes mellitus type 2 without retinopathy (HCC)    Monitor A1c On diet and Metformin      Relevant Medications   lisinopril-hydrochlorothiazide (ZESTORETIC) 10-12.5 MG tablet   metFORMIN (GLUCOPHAGE) 500 MG tablet   Shoulder pain, bilateral    Worse Labs incl CK, ESR Sports Med ref Meloxicam low dose  prn pc       Relevant Orders   Sedimentation rate   Comprehensive metabolic panel   CK   Ambulatory referral to Sports Medicine      Meds ordered this encounter  Medications   meloxicam (MOBIC) 7.5 MG tablet    Sig: Take 1 tablet (7.5 mg total) by mouth daily as needed for pain.    Dispense:  90 tablet    Refill:  0   lisinopril-hydrochlorothiazide (ZESTORETIC) 10-12.5 MG tablet    Sig: Take 1 tablet by mouth daily.    Dispense:  90 tablet    Refill:  3   metFORMIN (GLUCOPHAGE) 500 MG tablet    Sig: Take 1 tablet (500 mg total) by mouth daily.    Dispense:  90 tablet    Refill:  2   icosapent Ethyl (VASCEPA) 1 g capsule    Sig: Take 2 capsules (2 g total) by mouth 2 (two) times daily.    Dispense:  360 capsule    Refill:  3      Follow-up: Return in about 6 months (around 02/25/2024) for Wellness Exam.  Sonda Primes, MD

## 2023-09-28 ENCOUNTER — Other Ambulatory Visit: Payer: Self-pay | Admitting: Internal Medicine

## 2023-10-09 NOTE — Progress Notes (Unsigned)
[name] [address] [place] [address] Phone: [phone] Subjective:   [name], am serving as a scribe for Dr. Antoine Primas.  I'm seeing this patient by the request  of:  [name], Georgina Quint, MD  CC: bilateral shoulder pain   DUK:[id number]  Lisa [name] is a 78 y.o. female coming in with complaint of  B shoulder pain. Patient states that she had covid in November of 2023 and in January of 2024 she started to have pain in biceps, triceps a deltoids. Now also having pain in both shoulder joints. Painful to sleep with elbow flexed. Able to sleep better when elbows are extended. Denies any neck pain or numbness or tingling. Patient was on Meloxicam in December for about 20 days which was helpful.          Past Medical History:  Diagnosis Date   Arthritis    Cataract 2009   right-Dr. Nile Riggs   Hyperlipidemia    Hypertension    Post-operative nausea and vomiting    Retinal tear 2008   B- Dr. Gregery Na   Vitamin D deficiency    Past Surgical History:  Procedure Laterality Date   APPENDECTOMY  1963   CATARACT EXTRACTION     Dr. Nile Riggs   POLYPECTOMY     RETINAL TEAR REPAIR CRYOTHERAPY     Dr. Gregery Na   Social History   Socioeconomic History   Marital status: Single    Spouse name: Not on file   Number of children: Not on file   Years of education: Not on file   Highest education level: Not on file  Occupational History   Occupation: nutritionist [employer]  Tobacco Use   Smoking status: Never   Smokeless tobacco: Never  Vaping Use   Vaping status: Never Used  Substance and Sexual Activity   Alcohol use: Yes    Comment: wine occ. every week per pt   Drug use: No   Sexual activity: Not Currently  Other Topics Concern   Not on file  Social History Narrative   Regular exercise- Yes   Lives alone.   Social Drivers of Corporate investment banker Strain: Low Risk  (08/26/2023)   Overall Financial Resource  Strain (CARDIA)    Difficulty of Paying Living Expenses: Not very hard  Food Insecurity: No Food Insecurity (08/26/2023)   Hunger Vital Sign    Worried About Running Out of Food in the Last Year: Never true    Ran Out of Food in the Last Year: Never true  Transportation Needs: No Transportation Needs (08/26/2023)   PRAPARE - Administrator, Civil Service (Medical): No    Lack of Transportation (Non-Medical): No  Physical Activity: Inactive (08/26/2023)   Exercise Vital Sign    Days of Exercise per Week: 0 days    Minutes of Exercise per Session: 0 min  Stress: Stress Concern Present (08/26/2023)   [employer] of Occupational Health - Occupational Stress Questionnaire    Feeling of Stress : To some extent  Social Connections: Moderately Integrated (08/26/2023)   Social Connection and Isolation Panel [NHANES]    Frequency of Communication with Friends and Family: More than three times a week    Frequency of Social Gatherings with Friends and Family: Three times a week    Attends Religious Services: More than 4 times per year    Active Member of Clubs or Organizations: Yes    Attends Banker  Meetings: Never    Marital Status: Never married   Allergies  Allergen Reactions   Atorvastatin     REACTION: achy   Ezetimibe-Simvastatin     REACTION: myalgia   Fish Oil     REACTION: bruising   Livalo [Pitavastatin Calcium]     pain   Lovastatin     REACTION: myalgias   Rosuvastatin     REACTION: myalgia   Zetia [Ezetimibe]     Side effects   Family History  Problem Relation Age of Onset   COPD Father    Heart disease Father    COPD Sister    Hyperlipidemia Other    Alzheimer's disease Mother    Colon cancer Maternal Aunt        60's   Stomach cancer Maternal Aunt     Current Outpatient Medications (Endocrine & Metabolic):    metFORMIN (GLUCOPHAGE) 500 MG tablet, TAKE 1 TABLET BY MOUTH EVERY DAY  Current Outpatient Medications (Cardiovascular):     icosapent Ethyl (VASCEPA) 1 g capsule, Take 2 capsules (2 g total) by mouth 2 (two) times daily.   lisinopril-hydrochlorothiazide (ZESTORETIC) 10-12.5 MG tablet, Take 1 tablet by mouth daily.   Current Outpatient Medications (Analgesics):    meloxicam (MOBIC) 7.5 MG tablet, Take 1 tablet (7.5 mg total) by mouth daily as needed for pain.  Current Outpatient Medications (Hematological):    cyanocobalamin 1000 MCG tablet, Take 1,000 mcg by mouth daily.   Current Outpatient Medications (Other):    ascorbic acid (VITAMIN C) 500 MG tablet, Take by mouth.   cholecalciferol (VITAMIN D) 1000 UNITS tablet, Take 1,000 Units by mouth every 3 (three) days.   gabapentin (NEURONTIN) 100 MG capsule, Take 1 capsule (100 mg total) by mouth at bedtime.   Glucosamine HCl 500 MG TABS, Take by mouth 2 (two) times daily.    SODIUM FLUORIDE 5000 PPM 1.1 % PSTE, Take by mouth.   Turmeric 500 MG TABS, Take 2 capsules by mouth 2 (two) times daily.   Reviewed prior external information including notes and imaging from  primary care provider As well as notes that were available from care everywhere and other healthcare systems.  Past medical history, social, surgical and family history all reviewed in electronic medical record.  No pertanent information unless stated regarding to the chief complaint.   Review of Systems:  No headache, visual changes, nausea, vomiting, diarrhea, constipation, dizziness, abdominal pain, skin rash, fevers, chills, night sweats, weight loss, swollen lymph nodes, body aches, joint swelling, chest pain, shortness of breath, mood changes. POSITIVE muscle aches some muscle pain but seems to be more fatigued than anything else.  Objective  Blood pressure 110/66, pulse 76, height 4\' 11"  (1.499 m), weight 122 lb (55.3 kg), SpO2 98%.   General: No apparent distress alert and oriented x3 mood and affect normal, dressed appropriately.  HEENT: Pupils equal, extraocular movements intact   Respiratory: Patient's speak in full sentences and does not appear short of breath  Cardiovascular: No lower extremity edema, non tender, no erythema  Shoulder exam shows patient does have weakness of the shoulder girdle.  Seems to be relatively significant.  Seems to have increasing fatigue of the musculature.  Some weakness noted in the C6 distribution.    Impression and Recommendations:     The above documentation has been reviewed and is accurate and complete Judi Saa, DO

## 2023-10-11 ENCOUNTER — Ambulatory Visit (INDEPENDENT_AMBULATORY_CARE_PROVIDER_SITE_OTHER): Payer: Medicare PPO

## 2023-10-11 ENCOUNTER — Ambulatory Visit: Payer: Medicare PPO | Admitting: Family Medicine

## 2023-10-11 ENCOUNTER — Other Ambulatory Visit: Payer: Self-pay

## 2023-10-11 VITALS — BP 110/66 | HR 76 | Ht 59.0 in | Wt 122.0 lb

## 2023-10-11 DIAGNOSIS — M2550 Pain in unspecified joint: Secondary | ICD-10-CM | POA: Diagnosis not present

## 2023-10-11 DIAGNOSIS — M25511 Pain in right shoulder: Secondary | ICD-10-CM

## 2023-10-11 DIAGNOSIS — M25512 Pain in left shoulder: Secondary | ICD-10-CM

## 2023-10-11 DIAGNOSIS — M546 Pain in thoracic spine: Secondary | ICD-10-CM | POA: Diagnosis not present

## 2023-10-11 DIAGNOSIS — M5031 Other cervical disc degeneration,  high cervical region: Secondary | ICD-10-CM | POA: Diagnosis not present

## 2023-10-11 DIAGNOSIS — M4312 Spondylolisthesis, cervical region: Secondary | ICD-10-CM | POA: Diagnosis not present

## 2023-10-11 DIAGNOSIS — G8929 Other chronic pain: Secondary | ICD-10-CM | POA: Diagnosis not present

## 2023-10-11 LAB — URIC ACID: Uric Acid, Serum: 6 mg/dL (ref 2.4–7.0)

## 2023-10-11 LAB — CBC WITH DIFFERENTIAL/PLATELET
Basophils Absolute: 0.1 10*3/uL (ref 0.0–0.1)
Basophils Relative: 0.8 % (ref 0.0–3.0)
Eosinophils Absolute: 0.1 10*3/uL (ref 0.0–0.7)
Eosinophils Relative: 1.2 % (ref 0.0–5.0)
HCT: 45.2 % (ref 36.0–46.0)
Hemoglobin: 15.2 g/dL — ABNORMAL HIGH (ref 12.0–15.0)
Lymphocytes Relative: 26.2 % (ref 12.0–46.0)
Lymphs Abs: 2.4 10*3/uL (ref 0.7–4.0)
MCHC: 33.6 g/dL (ref 30.0–36.0)
MCV: 88.6 fL (ref 78.0–100.0)
Monocytes Absolute: 0.7 10*3/uL (ref 0.1–1.0)
Monocytes Relative: 7.9 % (ref 3.0–12.0)
Neutro Abs: 5.8 10*3/uL (ref 1.4–7.7)
Neutrophils Relative %: 63.9 % (ref 43.0–77.0)
Platelets: 341 10*3/uL (ref 150.0–400.0)
RBC: 5.11 Mil/uL (ref 3.87–5.11)
RDW: 12.7 % (ref 11.5–15.5)
WBC: 9 10*3/uL (ref 4.0–10.5)

## 2023-10-11 LAB — COMPREHENSIVE METABOLIC PANEL
ALT: 20 U/L (ref 0–35)
AST: 20 U/L (ref 0–37)
Albumin: 4.6 g/dL (ref 3.5–5.2)
Alkaline Phosphatase: 69 U/L (ref 39–117)
BUN: 20 mg/dL (ref 6–23)
CO2: 27 meq/L (ref 19–32)
Calcium: 9.5 mg/dL (ref 8.4–10.5)
Chloride: 99 meq/L (ref 96–112)
Creatinine, Ser: 0.68 mg/dL (ref 0.40–1.20)
GFR: 83.84 mL/min (ref >60.00)
Glucose, Bld: 85 mg/dL (ref 70–99)
Potassium: 3.9 meq/L (ref 3.5–5.1)
Sodium: 135 meq/L (ref 135–145)
Total Bilirubin: 0.3 mg/dL (ref 0.2–1.2)
Total Protein: 7.4 g/dL (ref 6.0–8.3)

## 2023-10-11 LAB — SEDIMENTATION RATE: Sed Rate: 25 mm/h (ref 0–30)

## 2023-10-11 LAB — FERRITIN: Ferritin: 80.7 ng/mL (ref 10.0–291.0)

## 2023-10-11 LAB — IBC PANEL
Iron: 73 ug/dL (ref 42–145)
Saturation Ratios: 17.6 % — ABNORMAL LOW (ref 20.0–50.0)
TIBC: 414.4 ug/dL (ref 250.0–450.0)
Transferrin: 296 mg/dL (ref 212.0–360.0)

## 2023-10-11 LAB — TSH: TSH: 0.66 u[IU]/mL (ref 0.35–5.50)

## 2023-10-11 LAB — CK: Total CK: 153 U/L (ref 7–177)

## 2023-10-11 LAB — VITAMIN D 25 HYDROXY (VIT D DEFICIENCY, FRACTURES): VITD: 28.2 ng/mL — ABNORMAL LOW (ref 30.00–100.00)

## 2023-10-11 LAB — VITAMIN B12: Vitamin B-12: 1471 pg/mL — ABNORMAL HIGH (ref 211–911)

## 2023-10-11 LAB — C-REACTIVE PROTEIN: CRP: <1 mg/dL (ref 0.5–20.0)

## 2023-10-11 MED ORDER — GABAPENTIN 100 MG PO CAPS: 100.0000 mg | ORAL_CAPSULE | Freq: Every day | ORAL | 0 refills | Status: AC

## 2023-10-11 NOTE — Assessment & Plan Note (Signed)
Concern with patient having bilateral weakness of the upper extremities.  Patient does have weakness noted in the C6 distribution as well.  Discussed the possibility of cervical radiculopathy.  Patient though the x-rays do show some arthritic changes very high up as well as some C5-C6 degenerative disc disease.  Discussed gabapentin

## 2023-10-11 NOTE — Patient Instructions (Addendum)
Labs today Physical therapy will call you Start with my exercises Gabapentin 100mg  at night See me again in 6 weeks

## 2023-10-12 ENCOUNTER — Encounter: Payer: Self-pay | Admitting: Family Medicine

## 2023-10-14 ENCOUNTER — Encounter: Payer: Self-pay | Admitting: Family Medicine

## 2023-10-14 LAB — CYCLIC CITRUL PEPTIDE ANTIBODY, IGG: Cyclic Citrullin Peptide Ab: 35 U — ABNORMAL HIGH

## 2023-10-14 LAB — PTH, INTACT AND CALCIUM
Calcium: 9.7 mg/dL (ref 8.6–10.4)
PTH: 53 pg/mL (ref 16–77)

## 2023-10-14 LAB — ANA: Anti Nuclear Antibody (ANA): NEGATIVE

## 2023-10-14 LAB — CALCIUM, IONIZED: Calcium, Ion: 5.2 mg/dL (ref 4.7–5.5)

## 2023-10-14 LAB — RHEUMATOID FACTOR: Rheumatoid fact SerPl-aCnc: <10 [IU]/mL (ref <14)

## 2023-10-14 LAB — ANGIOTENSIN CONVERTING ENZYME: Angiotensin-Converting Enzyme: <5 U/L — ABNORMAL LOW (ref 9–67)

## 2023-10-21 ENCOUNTER — Encounter: Payer: Self-pay | Admitting: Family Medicine

## 2023-10-23 ENCOUNTER — Ambulatory Visit (HOSPITAL_BASED_OUTPATIENT_CLINIC_OR_DEPARTMENT_OTHER): Payer: Medicare PPO | Attending: Family Medicine | Admitting: Physical Therapy

## 2023-10-23 ENCOUNTER — Other Ambulatory Visit: Payer: Self-pay

## 2023-10-23 ENCOUNTER — Encounter (HOSPITAL_BASED_OUTPATIENT_CLINIC_OR_DEPARTMENT_OTHER): Payer: Self-pay | Admitting: Physical Therapy

## 2023-10-23 DIAGNOSIS — M6281 Muscle weakness (generalized): Secondary | ICD-10-CM | POA: Insufficient documentation

## 2023-10-23 DIAGNOSIS — M25512 Pain in left shoulder: Secondary | ICD-10-CM | POA: Insufficient documentation

## 2023-10-23 DIAGNOSIS — M25511 Pain in right shoulder: Secondary | ICD-10-CM | POA: Insufficient documentation

## 2023-10-23 DIAGNOSIS — G8929 Other chronic pain: Secondary | ICD-10-CM | POA: Diagnosis not present

## 2023-10-23 NOTE — Therapy (Signed)
 OUTPATIENT PHYSICAL THERAPY UPPER EXTREMITY EVALUATION   Patient Name: Lisa [name] MRN: [id number] DOB:1945/12/30, 78 y.o., female Today's Date: 10/23/2023  END OF SESSION:  PT End of Session - 10/23/23 1626     Visit Number 1    Number of Visits 16    Date for PT Re-Evaluation 01/21/24    Authorization Type [employer] MCR    PT Start Time 1615    PT Stop Time 1700    PT Time Calculation (min) 45 min    Activity Tolerance Patient tolerated treatment well    Behavior During Therapy [facility] for tasks assessed/performed             Past Medical History:  Diagnosis Date   Arthritis    Cataract 2009   right-Dr. Roz   Hyperlipidemia    Hypertension    Post-operative nausea and vomiting    Retinal tear 2008   B- Dr. Carlyle   Vitamin D  deficiency    Past Surgical History:  Procedure Laterality Date   APPENDECTOMY  1963   CATARACT EXTRACTION     Dr. Roz   POLYPECTOMY     RETINAL TEAR REPAIR CRYOTHERAPY     Dr. Carlyle   Patient Active Problem List   Diagnosis Date Noted   Cat bite 12/04/2022   Shoulder pain, bilateral 02/07/2022   History of COVID-19 10/10/2021   Head contusion 07/06/2021   Piriformis muscle pain 07/07/2020   Right ankle pain 07/07/2020   Dysuria 08/24/2019   Low back pain 08/24/2019   Laceration of skin of right knee 07/12/2019   Bruit of right carotid artery 07/16/2018   Abdominal pain 01/06/2018   Right knee pain 12/21/2016   Hip flexor tightness 11/30/2015   Piriformis syndrome of left side 04/20/2014   IT band syndrome 04/20/2014   Frequent falls 04/20/2014   Diabetes mellitus type 2 without retinopathy (HCC) 02/19/2013   Well adult exam 07/13/2012   Left lumbar radiculopathy 04/08/2012   Macular hole 02/28/2012   Status post intraocular lens implant 02/28/2012   Vitamin D  deficiency 04/30/2008   Dyslipidemia 10/31/2007   RECENT RETINAL DETACHMENT PARTIAL W/GIANT TEAR 10/31/2007   Essential hypertension 10/31/2007    PCP:  Garald Karlynn GAILS, MD   REFERRING PROVIDER:  Claudene Arthea HERO, DO     REFERRING DIAG:  [phone] (ICD-10-CM) - Bilateral shoulder pain, unspecified chronicity      THERAPY DIAG:  Chronic right shoulder pain  Chronic left shoulder pain  Muscle weakness (generalized)  Rationale for Evaluation and Treatment: Rehabilitation  ONSET DATE: 2023 Post COVID-19   SUBJECTIVE:  SUBJECTIVE STATEMENT:  Pt states she has has shoulder and UE pain with previous NT. It has recently started getting better with upping her vitamin D  dosage. Pt noted this started after getting sick in 2023. She noticed more UE weakness and pain. Pt notices more shoulder discomfort. Her hands are weaker with most difficulty opening a jar. Pt notes tightness into bilat shoulders with anything behind. Pt has been doing some of the MD exercises with some relief. Pins and needles do no go back the elbow. Pt is side sleeper. 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 Pt denies neck position causing pain.   Hand dominance: Right  PERTINENT HISTORY: LBP  PAIN:  Are you having pain? Yes: NPRS scale: 3/10 Pain location: bilat shoulders and upper arms Pain description: achy  Aggravating factors: reaching OH, lifting, reaching BHB, pushing a wheelchair Relieving factors: rest, heat  PRECAUTIONS: None  RED FLAGS: Cervical red flags: Dysphagia No, Dysmetria No, Diplopia No, Nystagmus No, and Nausea No and Spinal tumors: Yes, pt does report 1-2x/week of waking up in night sweats (advised on red flag answers to malignancy) unintentional weight loss but <10lbs  WEIGHT BEARING RESTRICTIONS: No  FALLS:  Has patient fallen in last 6 months? 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  DIAGNOSTIC FINDINGS:  FINDINGS: Minimal grade 1 anterolisthesis of C5-6 is noted secondary to posterior facet joint hypertrophy. Moderate degenerative disc disease is noted at C3-4, C4-5 and C6-7. No acute fracture is noted.   IMPRESSION: Multilevel degenerative disc disease.  No acute abnormality seen.  PATIENT SURVEYS :  QuickDASH Score: 38.6 / 100 = 38.6 %   COGNITION: Overall cognitive status: Within functional limits for tasks assessed     SENSATION: WFL  POSTURE: Increased kyphosis, fwd head, rounded shoulders   UPPER EXTREMITY ROM: tightness that is almost pain  Active ROM Right eval Left eval  Shoulder flexion 150 150  Shoulder extension    Shoulder abduction 150 150  Shoulder adduction    Shoulder internal rotation L2 reach L2  reach  Shoulder external rotation T2 reach T2  Elbow flexion    Elbow extension    Wrist flexion    Wrist extension    Wrist ulnar deviation    Wrist radial deviation    Wrist pronation    Wrist supination    (Blank rows = not tested)  UPPER EXTREMITY MMT:  MMT Right eval Left eval  Shoulder flexion 4/5 4/5  Shoulder extension    Shoulder abduction 4 pain 4 pain  Shoulder adduction    Shoulder internal rotation 4+ 4+  Shoulder external rotation 4 4  Middle trapezius 4 4  Lower trapezius    Elbow flexion 4 4  Elbow extension    Wrist flexion 4+ 4+  Wrist extension    Wrist ulnar deviation    Wrist radial deviation    Wrist pronation    Wrist supination    Grip strength (lbs)    (Blank rows = not tested)  SHOULDER SPECIAL TESTS: Impingement tests: Painful arc test: negative Rotator cuff assessment: Empty can test:  negative, External rotation lag sign: negative, and Belly press test: negative  Biceps assessment: Speed's test: negative  JOINT MOBILITY TESTING:  Bilateral GHJ inferior and posterior glide stiffness  PALPATION:  TTP of bilat deltoid, C/S paraspinals, and midtrap/rhomboids                                                                                                                             TREATMENT DATE: 2/5   Exercises - Shoulder External Rotation and Scapular Retraction with Resistance  - 1 x daily - 7 x weekly - 2 sets - 10 reps - Standing Shoulder Horizontal Abduction with Resistance  - 1 x daily - 7 x weekly - 2 sets - 10 reps - Single Arm Doorway Pec Stretch at 90 Degrees Abduction  - 2 x daily - 7 x weekly - 1 sets - 3 reps - 30 hold - Seated Thoracic Lumbar Extension  - 1 x daily - 7 x weekly - 2 sets - 10 reps - 3 hold - Standing Shoulder Row with Anchored Resistance  - 1 x daily - 7 x weekly - 3 sets - 10 reps   PATIENT EDUCATION: Education details: MOI, diagnosis, prognosis, anatomy, exercise progression, DOMS expectations, muscle firing,  envelope of function, HEP, POC  Person educated: Patient Education method: Explanation, Demonstration, Tactile cues, Verbal cues, and Handouts Education comprehension: verbalized understanding, returned demonstration, verbal cues required, and tactile cues required  HOME EXERCISE PROGRAM:  Access Code: [email or web address] URL: [email or web address] Date: 10/23/2023 Prepared by: Dale Call  ASSESSMENT:  CLINICAL IMPRESSION: Patient is a 78 y.o. female who was seen today for physical therapy evaluation and treatment for c/c of bilat shoulder pain. Pt's s/s appear consistent with potential cervical radicular pain. However, does not present with myotomal weakness. Shoulder and hand weakness appears to be deconditioning in nature. Pt's pain is minimally sensitive and irritable with movement. Pt's is more strength limited at this  time but T/S kyphosis and cervical position are also likely contributing limited shoulder mobility. Plan to continue with UE strength and postural corrections at future sessions. Pt would benefit from continued skilled therapy in order to reach goals and maximize functional bilat UE strength and ROM for full return to PLOF.     OBJECTIVE IMPAIRMENTS: decreased ROM, decreased strength, hypomobility, impaired flexibility, impaired UE functional use, improper body mechanics, postural dysfunction, and pain.   ACTIVITY LIMITATIONS: carrying, lifting, bathing, dressing, and reach over head  PARTICIPATION LIMITATIONS: cleaning, driving, shopping, community activity, and yard work  PERSONAL FACTORS: Age, Fitness, Time since onset of injury/illness/exacerbation, and 1-2 comorbidities:    are also affecting patient's functional outcome.   REHAB POTENTIAL: Good  CLINICAL DECISION MAKING: Stable/uncomplicated  EVALUATION COMPLEXITY: Low  GOALS:   SHORT TERM GOALS: Target date: 12/04/2023   Pt will become independent with HEP in order to demonstrate synthesis of PT education.  Baseline: Goal status: INITIAL  2.  Pt will decrease quick DASH score by at least 8% in order to demonstrate clinically significant reduction in disability.  Baseline:  Goal status: INITIAL  3.  Pt will be able to demonstrate BHB reach without pain in order to demonstrate functional improvement in UE function for self-care and house hold duties.  Baseline:  Goal status: INITIAL  LONG TERM GOALS: Target date: 01/15/2024   Pt  will become independent with final HEP in order to demonstrate synthesis of PT education.  Baseline:  Goal status: INITIAL  2.  Pt will be able to reach [facility] and carry/hold >10 lbs in order to demonstrate functional improvement in UE strength for return to house projects and exercise.  Baseline:  Goal status: INITIAL  3.  Pt will decrease quick DASH score by at least 16% in order to  demonstrate clinically significant reduction in disability.   Baseline:  Goal status: INITIAL  4.  Pt will be able to demonstrate/report ability to sit/stand/sleep for extended periods of time without pain in order to demonstrate functional improvement and tolerance to static positioning.  Baseline:  Goal status: INITIAL   PLAN: PT FREQUENCY: 1-2x/week  PT DURATION: 12 weeks  PLANNED INTERVENTIONS: 97164- PT Re-evaluation, 97110-Therapeutic exercises, 97530- Therapeutic activity, [id number]- Neuromuscular re-education, 97535- Self Care, 02859- Manual therapy, [id number]- Aquatic Therapy, 97760- Splinting, 97014- Electrical stimulation (unattended), [id number]- Electrical stimulation (manual), [id number]- Vasopneumatic device, [id number]- Ultrasound, M403810- Traction (mechanical), [id number]- Ionotophoresis 4mg /ml Dexamethasone, Taping, Dry Needling, Joint mobilization, Joint manipulation, Spinal manipulation, Spinal mobilization, Scar mobilization, DME instructions, Cryotherapy, and Moist heat  PLAN FOR NEXT SESSION: Consider grip testing; wall/table push ups, shoulder ABD isometrics (scare crow), review HEP techniqueDEWAINE Dale Call, PT 10/23/2023, 7:50 PM  Referring diagnosis? M25.511,M25.512 (ICD-10-CM) - Bilateral shoulder pain, unspecified chronicity Treatment diagnosis? (if different than referring diagnosis) M62.81 What was this (referring dx) caused by? []  Surgery []  Fall [x]  Ongoing issue [x]  Arthritis []  Other: ____________  Laterality: []  Rt []  Lt [x]  Both  Check all possible CPT codes:      []  97110 (Therapeutic Exercise)  []  92507 (SLP Treatment)  []  97112 (Neuro Re-ed)   []  92526 (Swallowing Treatment)   []  02883 (Gait Training)   []  [id number] (Cognitive Training, 1st 15 minutes) []  97140 (Manual Therapy)   []  97130 (Cognitive Training, each add'l 15 minutes)  []  97530 (Therapeutic Activities)  []  Other, List CPT Code ____________    []  97535 (Self Care)       [x]  All codes above (97110 -  97535)  [x]  97012 (Mechanical Traction)  [x]  97014 (E-stim Unattended)  [x]  97032 (E-stim manual)  [x]  97033 (Ionto)  [x]  97035 (Ultrasound)  [x]  97760 (Orthotic Fit) []  97750 (Physical Performance Training) [x]  [id number] (Aquatic Therapy) []  [id number] (Contrast Bath) []  02981 (Paraffin) []  97597 (Wound Care 1st 20 sq cm) []  97598 (Wound Care each add'l 20 sq cm) []  97016 (Vasopneumatic Device) []  [id number] Public Affairs Consultant) []  [id number] (Prosthetic Training)

## 2023-10-28 ENCOUNTER — Encounter (HOSPITAL_BASED_OUTPATIENT_CLINIC_OR_DEPARTMENT_OTHER): Payer: Self-pay

## 2023-10-28 ENCOUNTER — Ambulatory Visit (HOSPITAL_BASED_OUTPATIENT_CLINIC_OR_DEPARTMENT_OTHER): Payer: Medicare PPO

## 2023-10-28 DIAGNOSIS — G8929 Other chronic pain: Secondary | ICD-10-CM | POA: Diagnosis not present

## 2023-10-28 DIAGNOSIS — M6281 Muscle weakness (generalized): Secondary | ICD-10-CM | POA: Diagnosis not present

## 2023-10-28 DIAGNOSIS — M25512 Pain in left shoulder: Secondary | ICD-10-CM | POA: Diagnosis not present

## 2023-10-28 DIAGNOSIS — M25511 Pain in right shoulder: Secondary | ICD-10-CM | POA: Diagnosis not present

## 2023-10-28 NOTE — Therapy (Signed)
 OUTPATIENT PHYSICAL THERAPY UPPER EXTREMITY TREATMENT   Patient Name: Lisa [name] MRN: [id number] DOB:17-Sep-1946, 78 y.o., female Today's Date: 10/28/2023  END OF SESSION:  PT End of Session - 10/28/23 1329     Visit Number 2    Number of Visits 16    Date for PT Re-Evaluation 01/21/24    Authorization Type [employer] M[employer]    PT Start Time 1302    PT Stop Time 1345    PT Time Calculation (min) 43 min    Activity Tolerance Patient tolerated treatment well    Behavior During Therapy [facility] for tasks assessed/performed              Past Medical History:  Diagnosis Date   Arthritis    Cataract 2009   right-Dr. Gennie Kicks   Hyperlipidemia    Hypertension    Post-operative nausea and vomiting    Retinal tear 2008   B- Dr. Larna Plumber   Vitamin D  deficiency    Past Surgical History:  Procedure Laterality Date   APPENDECTOMY  1963   CATARACT EXTRACTION     Dr. Gennie Kicks   POLYPECTOMY     RETINAL TEAR REPAIR CRYOTHERAPY     Dr. Larna Plumber   Patient Active Problem List   Diagnosis Date Noted   Cat bite 12/04/2022   Shoulder pain, bilateral 02/07/2022   History of COVID-19 10/10/2021   Head contusion 07/06/2021   Piriformis muscle pain 07/07/2020   Right ankle pain 07/07/2020   Dysuria 08/24/2019   Low back pain 08/24/2019   Laceration of skin of right knee 07/12/2019   Bruit of right carotid artery 07/16/2018   Abdominal pain 01/06/2018   Right knee pain 12/21/2016   Hip flexor tightness 11/30/2015   Piriformis syndrome of left side 04/20/2014   IT band syndrome 04/20/2014   Frequent falls 04/20/2014   Diabetes mellitus type 2 without retinopathy (HCC) 02/19/2013   Well adult exam 07/13/2012   Left lumbar radiculopathy 04/08/2012   Macular hole 02/28/2012   Status post intraocular lens implant 02/28/2012   Vitamin D  deficiency 04/30/2008   Dyslipidemia 10/31/2007   RECENT RETINAL DETACHMENT PARTIAL W/GIANT TEAR 10/31/2007   Essential hypertension 10/31/2007    PCP:  Genia Kettering, MD   REFERRING PROVIDER:  Isidro Margo, DO     REFERRING DIAG:  [phone] (ICD-10-CM) - Bilateral shoulder pain, unspecified chronicity      THERAPY DIAG:  Chronic right shoulder pain  Chronic left shoulder pain  Muscle weakness (generalized)  Rationale for Evaluation and Treatment: Rehabilitation  ONSET DATE: 2023 Post COVID-19   SUBJECTIVE:  SUBJECTIVE STATEMENT: Pt reports she stopped doing some of her HEP due to "crunching" in L shoulder. No pain at rest.    Eval: Pt states she has has shoulder and UE pain with previous NT. It has recently started getting better with upping her vitamin D  dosage. Pt noted this started after getting sick in 2023. She noticed more UE weakness and pain. Pt notices more shoulder discomfort. Her hands are weaker with most difficulty opening a jar. Pt notes tightness into bilat shoulders with anything behind. Pt has been doing some of the MD exercises with some relief. Pins and needles do no go back the elbow. Pt is side sleeper. 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 Pt denies neck position causing pain.   Hand dominance: Right  PERTINENT HISTORY: LBP  PAIN:  Are you having pain? Yes: NPRS scale: 3/10 Pain location: bilat shoulders and upper arms Pain description: achy  Aggravating factors: reaching OH, lifting, reaching BHB, pushing a wheelchair Relieving factors: rest, heat  PRECAUTIONS: None  RED FLAGS: Cervical red flags: Dysphagia No, Dysmetria No, Diplopia No, Nystagmus No, and Nausea No and Spinal tumors: Yes, pt does report 1-2x/week of waking up in night sweats (advised on red flag answers to malignancy) unintentional weight loss but <10lbs  WEIGHT BEARING RESTRICTIONS: No  FALLS:  Has patient fallen in last 6  months? 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  DIAGNOSTIC FINDINGS:  FINDINGS: Minimal grade 1 anterolisthesis of C5-6 is noted secondary to posterior facet joint hypertrophy. Moderate degenerative disc disease is noted at C3-4, C4-5 and C6-7. No acute fracture is noted.   IMPRESSION: Multilevel degenerative disc disease.  No acute abnormality seen.  PATIENT SURVEYS :  QuickDASH Score: 38.6 / 100 = 38.6 %   COGNITION: Overall cognitive status: Within functional limits for tasks assessed     SENSATION: WFL  POSTURE: Increased kyphosis, fwd head, rounded shoulders   UPPER EXTREMITY ROM: tightness that is "almost pain"  Active ROM Right eval Left eval  Shoulder flexion 150 150  Shoulder extension    Shoulder abduction 150 150  Shoulder adduction    Shoulder internal rotation L2 reach L2  reach  Shoulder external rotation T2 reach T2  Elbow flexion    Elbow extension    Wrist flexion    Wrist extension    Wrist ulnar deviation    Wrist radial deviation    Wrist pronation    Wrist supination    (Blank rows = not tested)  UPPER EXTREMITY MMT:  MMT Right eval Left eval  Shoulder flexion 4/5 4/5  Shoulder extension    Shoulder abduction 4 pain 4 pain  Shoulder adduction    Shoulder internal rotation 4+ 4+  Shoulder external rotation 4 4  Middle trapezius 4 4  Lower trapezius    Elbow flexion 4 4  Elbow extension    Wrist flexion 4+ 4+  Wrist extension    Wrist ulnar deviation    Wrist radial deviation    Wrist pronation    Wrist supination    Grip strength (lbs)    (Blank rows = not  tested)  SHOULDER SPECIAL TESTS: Impingement  tests: Painful arc test: negative Rotator cuff assessment: Empty can test: negative, External rotation lag sign: negative, and Belly press test: negative Biceps assessment: Speed's test: negative  JOINT MOBILITY TESTING:  Bilateral GHJ inferior and posterior glide stiffness  PALPATION:  TTP of bilat deltoid, C/S paraspinals, and midtrap/rhomboids                                                                                                                             TREATMENT DATE:   Treatment                            10/28/23: Blank lines following charge title = not provided on this treatment date.   Manual:  TPDN No PROM bil shoulders, STM to bil UT There-ex:  There-Act:  Self Care:  Nuro-Re-ed: PNF D2 x15ea Supine ABC 1# x1ea Supine horizontal abduction 2x10 Seated bilateral ER 2x10 Row with GTB and tactile cues 2x15 Wall angels 2x10 Gait Training:     2/5   Exercises - Shoulder External Rotation and Scapular Retraction with Resistance  - 1 x daily - 7 x weekly - 2 sets - 10 reps - Standing Shoulder Horizontal Abduction with Resistance  - 1 x daily - 7 x weekly - 2 sets - 10 reps - Single Arm Doorway Pec Stretch at 90 Degrees Abduction  - 2 x daily - 7 x weekly - 1 sets - 3 reps - 30 hold - Seated Thoracic Lumbar Extension  - 1 x daily - 7 x weekly - 2 sets - 10 reps - 3 hold - Standing Shoulder Row with Anchored Resistance  - 1 x daily - 7 x weekly - 3 sets - 10 reps   PATIENT EDUCATION: Education details: MOI, diagnosis, prognosis, anatomy, exercise progression, DOMS expectations, muscle firing,  envelope of function, HEP, POC  Person educated: Patient Education method: Explanation, Demonstration, Tactile cues, Verbal cues, and Handouts Education comprehension: verbalized understanding, returned demonstration, verbal cues required, and tactile cues required  HOME EXERCISE PROGRAM:  Access Code:  [email or web address] URL: [email or web address] Date: 10/23/2023 Prepared by: Silver Dross  ASSESSMENT:  CLINICAL IMPRESSION:  Good tolerance for PROM bilaterally. Mild crepitus present in R shoulder. Tender to Uts, greater on L. Worked on scapular strengthening and postural re-ed with good tolerance. Most activities performed un resisted today to focus on form. Pt with good form with wall angel exercise, so added this to HEP for additional strengthening. Will continue to monitor crepitus/pain level, and progress as tolerated.   Eval: Patient is a 78 y.o. female who was seen today for physical therapy evaluation and treatment for c/c of bilat shoulder pain. Pt's s/s appear consistent with potential cervical radicular pain. However, does not present with myotomal weakness. Shoulder and hand weakness appears to be deconditioning in nature. Pt's pain is minimally sensitive and irritable with movement. Pt's is more strength limited at this time but T/S kyphosis  and cervical position are also likely contributing limited shoulder mobility. Plan to continue with UE strength and postural corrections at future sessions. Pt would benefit from continued skilled therapy in order to reach goals and maximize functional bilat UE strength and ROM for full return to PLOF.     OBJECTIVE IMPAIRMENTS: decreased ROM, decreased strength, hypomobility, impaired flexibility, impaired UE functional use, improper body mechanics, postural dysfunction, and pain.   ACTIVITY LIMITATIONS: carrying, lifting, bathing, dressing, and reach over head  PARTICIPATION LIMITATIONS: cleaning, driving, shopping, community activity, and yard work  PERSONAL FACTORS: Age, Fitness, Time since onset of injury/illness/exacerbation, and 1-2 comorbidities:    are also affecting patient's functional outcome.   REHAB POTENTIAL: Good  CLINICAL DECISION MAKING: Stable/uncomplicated  EVALUATION COMPLEXITY: Low  GOALS:   SHORT TERM GOALS:  Target date: 12/04/2023   Pt will become independent with HEP in order to demonstrate synthesis of PT education.  Baseline: Goal status: IN PROGRESS  2.  Pt will decrease quick DASH score by at least 8% in order to demonstrate clinically significant reduction in disability.   Baseline:  Goal status: INITIAL  3.  Pt will be able to demonstrate BHB reach without pain in order to demonstrate functional improvement in UE function for self-care and house hold duties.  Baseline:  Goal status: INITIAL  LONG TERM GOALS: Target date: 01/15/2024   Pt  will become independent with final HEP in order to demonstrate synthesis of PT education.  Baseline:  Goal status: INITIAL  2.  Pt will be able to reach [facility] and carry/hold >10 lbs in order to demonstrate functional improvement in UE strength for return to house projects and exercise.  Baseline:  Goal status: INITIAL  3.  Pt will decrease quick DASH score by at least 16% in order to demonstrate clinically significant reduction in disability.   Baseline:  Goal status: INITIAL  4.  Pt will be able to demonstrate/report ability to sit/stand/sleep for extended periods of time without pain in order to demonstrate functional improvement and tolerance to static positioning.  Baseline:  Goal status: INITIAL   PLAN: PT FREQUENCY: 1-2x/week  PT DURATION: 12 weeks  PLANNED INTERVENTIONS: 97164- PT Re-evaluation, 97110-Therapeutic exercises, 97530- Therapeutic activity, [id number]- Neuromuscular re-education, 97535- Self Care, 96045- Manual therapy, [id number]- Aquatic Therapy, 97760- Splinting, 97014- Electrical stimulation (unattended), [id number]- Electrical stimulation (manual), [id number]- Vasopneumatic device, [id number]- Ultrasound, [id number]- Traction (mechanical), [id number]- Ionotophoresis 4mg /ml Dexamethasone, Taping, Dry Needling, Joint mobilization, Joint manipulation, Spinal manipulation, Spinal mobilization, Scar mobilization, DME instructions, Cryotherapy, and Moist  heat  PLAN FOR NEXT SESSION: Consider grip testing; wall/table push ups, shoulder ABD isometrics (scare crow), review HEP technique*   Fronie Jewett Eevee Borbon, PTA 10/28/2023, 2:53 PM  Referring diagnosis? M25.511,M25.512 (ICD-10-CM) - Bilateral shoulder pain, unspecified chronicity Treatment diagnosis? (if different than referring diagnosis) M62.81 What was this (referring dx) caused by? []  Surgery []  Fall [x]  Ongoing issue [x]  Arthritis []  Other: ____________  Laterality: []  Rt []  Lt [x]  Both  Check all possible CPT codes:      []  97110 (Therapeutic Exercise)  []  92507 (SLP Treatment)  []  97112 (Neuro Re-ed)   []  92526 (Swallowing Treatment)   []  97116 (Gait Training)   []  [id number] (Cognitive Training, 1st 15 minutes) []  97140 (Manual Therapy)   []  97130 (Cognitive Training, each add'l 15 minutes)  []  40981 (Therapeutic Activities)  []  Other, List CPT Code ____________    []  97535 (Self Care)       [x]  All codes above (97110 - 97535)  [  x] [phone] (Mechanical Traction)  [x]  97014 (E-stim Unattended)  [x]  97032 (E-stim manual)  [x]  97033 (Ionto)  [x]  97035 (Ultrasound)  [x]  97760 (Orthotic Fit) []  [id number] (Physical Performance Training) [x]  [id number] (Aquatic Therapy) []  [id number] (Contrast Bath) []  [id number] (Paraffin) []  97597 (Wound Care 1st 20 sq cm) []  97598 (Wound Care each add'l 20 sq cm) []  97016 (Vasopneumatic Device) []  60454 Public affairs consultant) []  [phone] (Prosthetic Training)

## 2023-11-04 ENCOUNTER — Encounter (HOSPITAL_BASED_OUTPATIENT_CLINIC_OR_DEPARTMENT_OTHER): Payer: Self-pay

## 2023-11-04 ENCOUNTER — Ambulatory Visit (HOSPITAL_BASED_OUTPATIENT_CLINIC_OR_DEPARTMENT_OTHER): Payer: Medicare PPO

## 2023-11-04 DIAGNOSIS — G8929 Other chronic pain: Secondary | ICD-10-CM | POA: Diagnosis not present

## 2023-11-04 DIAGNOSIS — M6281 Muscle weakness (generalized): Secondary | ICD-10-CM | POA: Diagnosis not present

## 2023-11-04 DIAGNOSIS — M25511 Pain in right shoulder: Secondary | ICD-10-CM | POA: Diagnosis not present

## 2023-11-04 DIAGNOSIS — M25512 Pain in left shoulder: Secondary | ICD-10-CM | POA: Diagnosis not present

## 2023-11-04 NOTE — Therapy (Signed)
 OUTPATIENT PHYSICAL THERAPY UPPER EXTREMITY TREATMENT   Patient Name: Lisa [name] MRN: [id number] DOB:Dec 12, 1945, 78 y.o., female Today's Date: 11/04/2023  END OF SESSION:  PT End of Session - 11/04/23 1338     Visit Number 3    Number of Visits 16    Date for PT Re-Evaluation 01/21/24    Authorization Type [employer] MCR    PT Start Time 1303    PT Stop Time 1346    PT Time Calculation (min) 43 min    Activity Tolerance Patient tolerated treatment well    Behavior During Therapy [facility] for tasks assessed/performed               Past Medical History:  Diagnosis Date   Arthritis    Cataract 2009   right-Dr. Nile Riggs   Hyperlipidemia    Hypertension    Post-operative nausea and vomiting    Retinal tear 2008   B- Dr. Gregery Na   Vitamin D deficiency    Past Surgical History:  Procedure Laterality Date   APPENDECTOMY  1963   CATARACT EXTRACTION     Dr. Nile Riggs   POLYPECTOMY     RETINAL TEAR REPAIR CRYOTHERAPY     Dr. Gregery Na   Patient Active Problem List   Diagnosis Date Noted   Cat bite 12/04/2022   Shoulder pain, bilateral 02/07/2022   History of COVID-19 10/10/2021   Head contusion 07/06/2021   Piriformis muscle pain 07/07/2020   Right ankle pain 07/07/2020   Dysuria 08/24/2019   Low back pain 08/24/2019   Laceration of skin of right knee 07/12/2019   Bruit of right carotid artery 07/16/2018   Abdominal pain 01/06/2018   Right knee pain 12/21/2016   Hip flexor tightness 11/30/2015   Piriformis syndrome of left side 04/20/2014   IT band syndrome 04/20/2014   Frequent falls 04/20/2014   Diabetes mellitus type 2 without retinopathy (HCC) 02/19/2013   Well adult exam 07/13/2012   Left lumbar radiculopathy 04/08/2012   Macular hole 02/28/2012   Status post intraocular lens implant 02/28/2012   Vitamin D deficiency 04/30/2008   Dyslipidemia 10/31/2007   RECENT RETINAL DETACHMENT PARTIAL W/GIANT TEAR 10/31/2007   Essential hypertension 10/31/2007    PCP:  Tresa Garter, MD   REFERRING PROVIDER:  Judi Saa, DO     REFERRING DIAG:  [phone] (ICD-10-CM) - Bilateral shoulder pain, unspecified chronicity      THERAPY DIAG:  Chronic right shoulder pain  Chronic left shoulder pain  Muscle weakness (generalized)  Rationale for Evaluation and Treatment: Rehabilitation  ONSET DATE: 2023 Post COVID-19   SUBJECTIVE:  SUBJECTIVE STATEMENT: Pt reports continued "crunching" with horizontal abduction exercise in HEP.    Eval: Pt states she has has shoulder and UE pain with previous NT. It has recently started getting better with upping her vitamin D dosage. Pt noted this started after getting sick in 2023. She noticed more UE weakness and pain. Pt notices more shoulder discomfort. Her hands are weaker with most difficulty opening a jar. Pt notes tightness into bilat shoulders with anything behind. Pt has been doing some of the MD exercises with some relief. Pins and needles do no go back the elbow. Pt is side sleeper. 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 Pt denies neck position causing pain.   Hand dominance: Right  PERTINENT HISTORY: LBP  PAIN:  Are you having pain? Yes: NPRS scale: 3/10 Pain location: bilat shoulders and upper arms Pain description: achy  Aggravating factors: reaching OH, lifting, reaching BHB, pushing a wheelchair Relieving factors: rest, heat  PRECAUTIONS: None  RED FLAGS: Cervical red flags: Dysphagia No, Dysmetria No, Diplopia No, Nystagmus No, and Nausea No and Spinal tumors: Yes, pt does report 1-2x/week of waking up in night sweats (advised on red flag answers to malignancy) unintentional weight loss but <10lbs  WEIGHT BEARING RESTRICTIONS: No  FALLS:  Has patient fallen in last 6 months? 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  DIAGNOSTIC FINDINGS:  FINDINGS: Minimal grade 1 anterolisthesis of C5-6 is noted secondary to posterior facet joint hypertrophy. Moderate degenerative disc disease is noted at C3-4, C4-5 and C6-7. No acute fracture is noted.   IMPRESSION: Multilevel degenerative disc disease.  No acute abnormality seen.  PATIENT SURVEYS :  QuickDASH Score: 38.6 / 100 = 38.6 %   COGNITION: Overall cognitive status: Within functional limits for tasks assessed     SENSATION: WFL  POSTURE: Increased kyphosis, fwd head, rounded shoulders   UPPER EXTREMITY ROM: tightness that is "almost pain"  Active ROM Right eval Left eval  Shoulder flexion 150 150  Shoulder extension    Shoulder abduction 150 150  Shoulder adduction    Shoulder internal rotation L2 reach L2  reach  Shoulder external rotation T2 reach T2  Elbow flexion    Elbow extension    Wrist flexion    Wrist extension    Wrist ulnar deviation    Wrist radial deviation    Wrist pronation    Wrist supination    (Blank rows = not tested)  UPPER EXTREMITY MMT:  MMT Right eval Left eval  Shoulder flexion 4/5 4/5  Shoulder extension    Shoulder abduction 4 pain 4 pain  Shoulder adduction    Shoulder internal rotation 4+ 4+  Shoulder external rotation 4 4  Middle trapezius 4 4  Lower trapezius    Elbow flexion 4 4  Elbow extension    Wrist flexion 4+ 4+  Wrist extension    Wrist ulnar deviation    Wrist radial deviation    Wrist pronation    Wrist supination    Grip strength (lbs)    (Blank rows = not tested)  SHOULDER SPECIAL  TESTS: Impingement tests: Painful arc test: negative Rotator cuff assessment: Empty  can test: negative, External rotation lag sign: negative, and Belly press test: negative Biceps assessment: Speed's test: negative  JOINT MOBILITY TESTING:  Bilateral GHJ inferior and posterior glide stiffness  PALPATION:  TTP of bilat deltoid, C/S paraspinals, and midtrap/rhomboids                                                                                                                             TREATMENT DATE:   Treatment                             11/04/23: Blank lines following charge title = not provided on this treatment date.   Manual:  TPDN No PROM bil shoulders, STM to L UT, LS, and medial border of L scapula There-ex: S/l open book x10 5" hold(grinding felt) Supine flexion with RTB for serratus activation x5 (L GHJ crepitus) Upper trap stretching L Supine ABC with 1# x2 L, cues for scap retraction Rhythmic stabilization L UE with cues for scap retraction  There-Act:  Self Care:  Nuro-Re-ed:     Treatment                             10/28/23: Blank lines following charge title = not provided on this treatment date.   Manual:  TPDN No PROM bil shoulders, STM to bil UT There-ex:  There-Act:  Self Care:  Nuro-Re-ed: PNF D2 x15ea Supine ABC 1# x1ea Supine horizontal abduction 2x10 Seated bilateral ER 2x10 Row with GTB and tactile cues 2x15 Wall angels 2x10     2/5   Exercises - Shoulder External Rotation and Scapular Retraction with Resistance  - 1 x daily - 7 x weekly - 2 sets - 10 reps - Standing Shoulder Horizontal Abduction with Resistance  - 1 x daily - 7 x weekly - 2 sets - 10 reps - Single Arm Doorway Pec Stretch at 90 Degrees Abduction  - 2 x daily - 7 x weekly - 1 sets - 3 reps - 30 hold - Seated Thoracic Lumbar Extension  - 1 x daily - 7 x weekly - 2 sets - 10 reps - 3 hold - Standing Shoulder Row with Anchored Resistance  - 1 x daily - 7 x  weekly - 3 sets - 10 reps   PATIENT EDUCATION: Education details: MOI, diagnosis, prognosis, anatomy, exercise progression, DOMS expectations, muscle firing,  envelope of function, HEP, POC  Person educated: Patient Education method: Explanation, Demonstration, Tactile cues, Verbal cues, and Handouts Education comprehension: verbalized understanding, returned demonstration, verbal cues required, and tactile cues required  HOME EXERCISE PROGRAM:  Access Code: [email or web address] URL: [email or web address] Date: 10/23/2023 Prepared by: Zebedee Iba  ASSESSMENT:  CLINICAL IMPRESSION:  Significant tightness and trigger points palpated in L UT and LS. Spent time on manual therapy to address this and also instructed pt in UT stretch for L UT.  Pt demonstrated significant grinding of scapula against rib cage with horizontal abduction movement in sidelying. She does have tightness in medial aspect of scapular region, though. Worked on L UE scapular stabilization today with pt demonstrating difficulty with this, though she did improve following cuing. Pt to hold on resisted Horizontal abduction exercise at home. Will monitor crepitus and progress with stabilization and strengthening as tolerated.  Eval: Patient is a 78 y.o. female who was seen today for physical therapy evaluation and treatment for c/c of bilat shoulder pain. Pt's s/s appear consistent with potential cervical radicular pain. However, does not present with myotomal weakness. Shoulder and hand weakness appears to be deconditioning in nature. Pt's pain is minimally sensitive and irritable with movement. Pt's is more strength limited at this time but T/S kyphosis and cervical position are also likely contributing limited shoulder mobility. Plan to continue with UE strength and postural corrections at future sessions. Pt would benefit from continued skilled therapy in order to reach goals and maximize functional bilat UE strength and ROM for  full return to PLOF.     OBJECTIVE IMPAIRMENTS: decreased ROM, decreased strength, hypomobility, impaired flexibility, impaired UE functional use, improper body mechanics, postural dysfunction, and pain.   ACTIVITY LIMITATIONS: carrying, lifting, bathing, dressing, and reach over head  PARTICIPATION LIMITATIONS: cleaning, driving, shopping, community activity, and yard work  PERSONAL FACTORS: Age, Fitness, Time since onset of injury/illness/exacerbation, and 1-2 comorbidities:    are also affecting patient's functional outcome.   REHAB POTENTIAL: Good  CLINICAL DECISION MAKING: Stable/uncomplicated  EVALUATION COMPLEXITY: Low  GOALS:   SHORT TERM GOALS: Target date: 12/04/2023   Pt will become independent with HEP in order to demonstrate synthesis of PT education.  Baseline: Goal status: IN PROGRESS  2.  Pt will decrease quick DASH score by at least 8% in order to demonstrate clinically significant reduction in disability.   Baseline:  Goal status: INITIAL  3.  Pt will be able to demonstrate BHB reach without pain in order to demonstrate functional improvement in UE function for self-care and house hold duties.  Baseline:  Goal status: INITIAL  LONG TERM GOALS: Target date: 01/15/2024   Pt  will become independent with final HEP in order to demonstrate synthesis of PT education.  Baseline:  Goal status: INITIAL  2.  Pt will be able to reach [facility] and carry/hold >10 lbs in order to demonstrate functional improvement in UE strength for return to house projects and exercise.  Baseline:  Goal status: INITIAL  3.  Pt will decrease quick DASH score by at least 16% in order to demonstrate clinically significant reduction in disability.   Baseline:  Goal status: INITIAL  4.  Pt will be able to demonstrate/report ability to sit/stand/sleep for extended periods of time without pain in order to demonstrate functional improvement and tolerance to static positioning.  Baseline:   Goal status: INITIAL   PLAN: PT FREQUENCY: 1-2x/week  PT DURATION: 12 weeks  PLANNED INTERVENTIONS: 97164- PT Re-evaluation, 97110-Therapeutic exercises, 97530- Therapeutic activity, [id number]- Neuromuscular re-education, 97535- Self Care, 69629- Manual therapy, [id number]- Aquatic Therapy, 97760- Splinting, 97014- Electrical stimulation (unattended), [id number]- Electrical stimulation (manual), [id number]- Vasopneumatic device, [id number]- Ultrasound, [id number]- Traction (mechanical), [id number]- Ionotophoresis 4mg /ml Dexamethasone, Taping, Dry Needling, Joint mobilization, Joint manipulation, Spinal manipulation, Spinal mobilization, Scar mobilization, DME instructions, Cryotherapy, and Moist heat  PLAN FOR NEXT SESSION: Consider grip testing; wall/table push ups, shoulder ABD isometrics (scare crow), review HEP technique*   Donnel Saxon Inman Fettig, PTA 11/04/2023, 2:22 PM  Referring  diagnosis? M25.511,M25.512 (ICD-10-CM) - Bilateral shoulder pain, unspecified chronicity Treatment diagnosis? (if different than referring diagnosis) M62.81 What was this (referring dx) caused by? []  Surgery []  Fall [x]  Ongoing issue [x]  Arthritis []  Other: ____________  Laterality: []  Rt []  Lt [x]  Both  Check all possible CPT codes:      []  97110 (Therapeutic Exercise)  []  92507 (SLP Treatment)  []  97112 (Neuro Re-ed)   []  92526 (Swallowing Treatment)   []  97116 (Gait Training)   []  [id number] (Cognitive Training, 1st 15 minutes) []  97140 (Manual Therapy)   []  97130 (Cognitive Training, each add'l 15 minutes)  []  97530 (Therapeutic Activities)  []  Other, List CPT Code ____________    []  97535 (Self Care)       [x]  All codes above (97110 - 97535)  [x]  97012 (Mechanical Traction)  [x]  97014 (E-stim Unattended)  [x]  97032 (E-stim manual)  [x]  97033 (Ionto)  [x]  97035 (Ultrasound)  [x]  97760 (Orthotic Fit) []  97750 (Physical Performance Training) [x]  [id number] (Aquatic Therapy) []  97034 (Contrast Bath) []  [id number] (Paraffin) []  97597 (Wound  Care 1st 20 sq cm) []  97598 (Wound Care each add'l 20 sq cm) []  97016 (Vasopneumatic Device) []  [id number] Public affairs consultant) []  [id number] (Prosthetic Training)

## 2023-11-18 ENCOUNTER — Other Ambulatory Visit: Payer: Self-pay | Admitting: Internal Medicine

## 2023-11-18 ENCOUNTER — Ambulatory Visit (HOSPITAL_BASED_OUTPATIENT_CLINIC_OR_DEPARTMENT_OTHER): Payer: Medicare PPO | Attending: Family Medicine

## 2023-11-18 ENCOUNTER — Encounter (HOSPITAL_BASED_OUTPATIENT_CLINIC_OR_DEPARTMENT_OTHER): Payer: Self-pay

## 2023-11-18 DIAGNOSIS — M25512 Pain in left shoulder: Secondary | ICD-10-CM | POA: Diagnosis not present

## 2023-11-18 DIAGNOSIS — G8929 Other chronic pain: Secondary | ICD-10-CM | POA: Diagnosis not present

## 2023-11-18 DIAGNOSIS — M6281 Muscle weakness (generalized): Secondary | ICD-10-CM | POA: Diagnosis not present

## 2023-11-18 DIAGNOSIS — M25511 Pain in right shoulder: Secondary | ICD-10-CM | POA: Diagnosis not present

## 2023-11-18 NOTE — Therapy (Signed)
 OUTPATIENT PHYSICAL THERAPY UPPER EXTREMITY TREATMENT   Patient Name: Lisa [name] MRN: [id number] DOB:[birth year], 78 y.o., female Today's Date: 11/18/2023  END OF SESSION:  PT End of Session - 11/18/23 1340     Visit Number 4    Number of Visits 16    Date for PT Re-Evaluation 01/21/24    Authorization Type [employer] MCR    PT Start Time 1303    PT Stop Time 1346    PT Time Calculation (min) 43 min    Activity Tolerance Patient tolerated treatment well    Behavior During Therapy [facility] for tasks assessed/performed                Past Medical History:  Diagnosis Date   Arthritis    Cataract 2009   right-Dr. Nile Riggs   Hyperlipidemia    Hypertension    Post-operative nausea and vomiting    Retinal tear 2008   B- Dr. Gregery Na   Vitamin D deficiency    Past Surgical History:  Procedure Laterality Date   APPENDECTOMY  1963   CATARACT EXTRACTION     Dr. Nile Riggs   POLYPECTOMY     RETINAL TEAR REPAIR CRYOTHERAPY     Dr. Gregery Na   Patient Active Problem List   Diagnosis Date Noted   Cat bite 12/04/2022   Shoulder pain, bilateral 02/07/2022   History of COVID-19 10/10/2021   Head contusion 07/06/2021   Piriformis muscle pain 07/07/2020   Right ankle pain 07/07/2020   Dysuria 08/24/2019   Low back pain 08/24/2019   Laceration of skin of right knee 07/12/2019   Bruit of right carotid artery 07/16/2018   Abdominal pain 01/06/2018   Right knee pain 12/21/2016   Hip flexor tightness 11/30/2015   Piriformis syndrome of left side 04/20/2014   IT band syndrome 04/20/2014   Frequent falls 04/20/2014   Diabetes mellitus type 2 without retinopathy (HCC) 02/19/2013   Well adult exam 07/13/2012   Left lumbar radiculopathy 04/08/2012   Macular hole 02/28/2012   Status post intraocular lens implant 02/28/2012   Vitamin D deficiency 04/30/2008   Dyslipidemia 10/31/2007   RECENT RETINAL DETACHMENT PARTIAL W/GIANT TEAR 10/31/2007   Essential hypertension 10/31/2007     PCP: Tresa Garter, MD   REFERRING PROVIDER:  Judi Saa, DO     REFERRING DIAG:  [phone] (ICD-10-CM) - Bilateral shoulder pain, unspecified chronicity      THERAPY DIAG:  Chronic right shoulder pain  Chronic left shoulder pain  Muscle weakness (generalized)  Rationale for Evaluation and Treatment: Rehabilitation  ONSET DATE: 2023 Post COVID-19   SUBJECTIVE:  SUBJECTIVE STATEMENT: Pt reports continued "crunching" with horizontal abduction exercise in HEP.    Eval: Pt states she has has shoulder and UE pain with previous NT. It has recently started getting better with upping her vitamin D dosage. Pt noted this started after getting sick in 2023. She noticed more UE weakness and pain. Pt notices more shoulder discomfort. Her hands are weaker with most difficulty opening a jar. Pt notes tightness into bilat shoulders with anything behind. Pt has been doing some of the MD exercises with some relief. Pins and needles do no go back the elbow. Pt is side sleeper. 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 Pt denies neck position causing pain.   Hand dominance: Right  PERTINENT HISTORY: LBP  PAIN:  Are you having pain? Yes: NPRS scale: 3/10 Pain location: bilat shoulders and upper arms Pain description: achy  Aggravating factors: reaching OH, lifting, reaching BHB, pushing a wheelchair Relieving factors: rest, heat  PRECAUTIONS: None  RED FLAGS: Cervical red flags: Dysphagia No, Dysmetria No, Diplopia No, Nystagmus No, and Nausea No and Spinal tumors: Yes, pt does report 1-2x/week of waking up in night sweats (advised on red flag answers to malignancy) unintentional weight loss but <10lbs  WEIGHT BEARING RESTRICTIONS: No  FALLS:  Has patient fallen in last 6 months?  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  DIAGNOSTIC FINDINGS:  FINDINGS: Minimal grade 1 anterolisthesis of C5-6 is noted secondary to posterior facet joint hypertrophy. Moderate degenerative disc disease is noted at C3-4, C4-5 and C6-7. No acute fracture is noted.   IMPRESSION: Multilevel degenerative disc disease.  No acute abnormality seen.  PATIENT SURVEYS :  QuickDASH Score: 38.6 / 100 = 38.6 %   COGNITION: Overall cognitive status: Within functional limits for tasks assessed     SENSATION: WFL  POSTURE: Increased kyphosis, fwd head, rounded shoulders   UPPER EXTREMITY ROM: tightness that is "almost pain"  Active ROM Right eval Left eval  Shoulder flexion 150 150  Shoulder extension    Shoulder abduction 150 150  Shoulder adduction    Shoulder internal rotation L2 reach L2  reach  Shoulder external rotation T2 reach T2  Elbow flexion    Elbow extension    Wrist flexion    Wrist extension    Wrist ulnar deviation    Wrist radial deviation    Wrist pronation    Wrist supination    (Blank rows = not tested)  UPPER EXTREMITY MMT:  MMT Right eval Left eval  Shoulder flexion 4/5 4/5  Shoulder extension    Shoulder abduction 4 pain 4 pain  Shoulder adduction    Shoulder internal rotation 4+ 4+  Shoulder external rotation 4 4  Middle trapezius 4 4  Lower trapezius    Elbow flexion 4 4  Elbow extension    Wrist flexion 4+ 4+  Wrist extension    Wrist ulnar deviation    Wrist radial deviation    Wrist pronation    Wrist supination    Grip strength (lbs)    (Blank rows = not tested)  SHOULDER  SPECIAL TESTS: Impingement tests: Painful arc test: negative Rotator cuff assessment: Empty  can test: negative, External rotation lag sign: negative, and Belly press test: negative Biceps assessment: Speed's test: negative  JOINT MOBILITY TESTING:  Bilateral GHJ inferior and posterior glide stiffness  PALPATION:  TTP of bilat deltoid, C/S paraspinals, and midtrap/rhomboids                                                                                                                             TREATMENT DATE:   Treatment                             11/18/23: Blank lines following charge title = not provided on this treatment date.   Manual:  TPDN No PROM bil shoulders, STM to L UT, LS,pecs, and medial border of L scapula There-ex: Supine H abduction small range with YTB wrapped around hands 2x10 Supine ABC with 1# x3 B, cues for scap retraction Rhythmic stabilization L UE with cues for scap retraction Theraband row with tactile cues- RTB 2x10 Theraband extension- RTB 2x10  There-Act:  Self Care:  Nuro-Re-ed:    Treatment                             11/04/23: Blank lines following charge title = not provided on this treatment date.   Manual:  TPDN No PROM bil shoulders, STM to L UT, LS, and medial border of L scapula There-ex: S/l open book x10 5" hold(grinding felt) Supine flexion with RTB for serratus activation x5 (L GHJ crepitus) Upper trap stretching L Supine ABC with 1# x2 L, cues for scap retraction Rhythmic stabilization L UE with cues for scap retraction     Treatment                             10/28/23: Blank lines following charge title = not provided on this treatment date.   Manual:  TPDN No PROM bil shoulders, STM to bil UT There-ex:  There-Act:  Self Care:  Nuro-Re-ed: PNF D2 x15ea Supine ABC 1# x1ea Supine horizontal abduction 2x10 Seated bilateral ER 2x10 Row with GTB and tactile cues 2x15 Wall angels  2x10     2/5   Exercises - Shoulder External Rotation and Scapular Retraction with Resistance  - 1 x daily - 7 x weekly - 2 sets - 10 reps - Standing Shoulder Horizontal Abduction with Resistance  - 1 x daily - 7 x weekly - 2 sets - 10 reps - Single Arm Doorway Pec Stretch at 90 Degrees Abduction  - 2 x daily - 7 x weekly - 1 sets - 3 reps - 30 hold - Seated Thoracic Lumbar Extension  - 1 x daily - 7 x weekly - 2 sets - 10 reps - 3 hold - Standing Shoulder Row with Anchored Resistance  - 1 x daily - 7 x  weekly - 3 sets - 10 reps   PATIENT EDUCATION: Education details: MOI, diagnosis, prognosis, anatomy, exercise progression, DOMS expectations, muscle firing,  envelope of function, HEP, POC  Person educated: Patient Education method: Explanation, Demonstration, Tactile cues, Verbal cues, and Handouts Education comprehension: verbalized understanding, returned demonstration, verbal cues required, and tactile cues required  HOME EXERCISE PROGRAM:  Access Code: [email or web address] URL: [email or web address] Date: 10/23/2023 Prepared by: Zebedee Iba  ASSESSMENT:  CLINICAL IMPRESSION:  Continued to work on STM and TPR to address ongoing restrictions and trigger points within pecs, lats, periscapular mm, and UT/LS. L scapula remains elevated compared to R. Increased difficulty on L compared to R with supine ABC for scapular stability. Pt also challenged by rhythmic stabilization on L UE. Mild GHJ crepitus present anteriorly with resisted row, though no pain present. Tactile cues provided with rows/extensions for proper scapular engagement. Will continue to progress as tolerated to improve scapular and GHJ mobility, stability, and strength.  Eval: Patient is a 78 y.o. female who was seen today for physical therapy evaluation and treatment for c/c of bilat shoulder pain. Pt's s/s appear consistent with potential cervical radicular pain. However, does not present with myotomal weakness.  Shoulder and hand weakness appears to be deconditioning in nature. Pt's pain is minimally sensitive and irritable with movement. Pt's is more strength limited at this time but T/S kyphosis and cervical position are also likely contributing limited shoulder mobility. Plan to continue with UE strength and postural corrections at future sessions. Pt would benefit from continued skilled therapy in order to reach goals and maximize functional bilat UE strength and ROM for full return to PLOF.     OBJECTIVE IMPAIRMENTS: decreased ROM, decreased strength, hypomobility, impaired flexibility, impaired UE functional use, improper body mechanics, postural dysfunction, and pain.   ACTIVITY LIMITATIONS: carrying, lifting, bathing, dressing, and reach over head  PARTICIPATION LIMITATIONS: cleaning, driving, shopping, community activity, and yard work  PERSONAL FACTORS: Age, Fitness, Time since onset of injury/illness/exacerbation, and 1-2 comorbidities:    are also affecting patient's functional outcome.   REHAB POTENTIAL: Good  CLINICAL DECISION MAKING: Stable/uncomplicated  EVALUATION COMPLEXITY: Low  GOALS:   SHORT TERM GOALS: Target date: 12/04/2023   Pt will become independent with HEP in order to demonstrate synthesis of PT education.  Baseline: Goal status: IN PROGRESS  2.  Pt will decrease quick DASH score by at least 8% in order to demonstrate clinically significant reduction in disability.   Baseline:  Goal status: INITIAL  3.  Pt will be able to demonstrate BHB reach without pain in order to demonstrate functional improvement in UE function for self-care and house hold duties.  Baseline:  Goal status: INITIAL  LONG TERM GOALS: Target date: 01/15/2024   Pt  will become independent with final HEP in order to demonstrate synthesis of PT education.  Baseline:  Goal status: INITIAL  2.  Pt will be able to reach [facility] and carry/hold >10 lbs in order to demonstrate functional improvement  in UE strength for return to house projects and exercise.  Baseline:  Goal status: INITIAL  3.  Pt will decrease quick DASH score by at least 16% in order to demonstrate clinically significant reduction in disability.   Baseline:  Goal status: INITIAL  4.  Pt will be able to demonstrate/report ability to sit/stand/sleep for extended periods of time without pain in order to demonstrate functional improvement and tolerance to static positioning.  Baseline:  Goal status: INITIAL   PLAN: PT FREQUENCY: 1-2x/week  PT DURATION: 12 weeks  PLANNED INTERVENTIONS: 97164- PT Re-evaluation, 97110-Therapeutic exercises, 97530- Therapeutic activity, [id number]- Neuromuscular re-education, 97535- Self Care, 40981- Manual therapy, [id number]- Aquatic Therapy, 97760- Splinting, 97014- Electrical stimulation (unattended), [id number]- Electrical stimulation (manual), [id number]- Vasopneumatic device, [id number]- Ultrasound, [id number]- Traction (mechanical), [id number]- Ionotophoresis 4mg /ml Dexamethasone, Taping, Dry Needling, Joint mobilization, Joint manipulation, Spinal manipulation, Spinal mobilization, Scar mobilization, DME instructions, Cryotherapy, and Moist heat  PLAN FOR NEXT SESSION: Consider grip testing; wall/table push ups, shoulder ABD isometrics (scare crow), review HEP technique*   Demiyah Fischbach E Terris Germano, PTA 11/18/2023, 2:50 PM  Referring diagnosis? M25.511,M25.512 (ICD-10-CM) - Bilateral shoulder pain, unspecified chronicity Treatment diagnosis? (if different than referring diagnosis) M62.81 What was this (referring dx) caused by? []  Surgery []  Fall [x]  Ongoing issue [x]  Arthritis []  Other: ____________  Laterality: []  Rt []  Lt [x]  Both  Check all possible CPT codes:      []  19147 (Therapeutic Exercise)  []  92507 (SLP Treatment)  []  97112 (Neuro Re-ed)   []  92526 (Swallowing Treatment)   []  97116 (Gait Training)   []  [id number] (Cognitive Training, 1st 15 minutes) []  97140 (Manual Therapy)   []  97130 (Cognitive Training,  each add'l 15 minutes)  []  97530 (Therapeutic Activities)  []  Other, List CPT Code ____________    []  97535 (Self Care)       [x]  All codes above (97110 - 97535)  [x]  97012 (Mechanical Traction)  [x]  97014 (E-stim Unattended)  [x]  97032 (E-stim manual)  [x]  97033 (Ionto)  [x]  97035 (Ultrasound)  [x]  97760 (Orthotic Fit) []  [id number] (Physical Performance Training) [x]  [id number] (Aquatic Therapy) []  97034 (Contrast Bath) []  [id number] (Paraffin) []  97597 (Wound Care 1st 20 sq cm) []  97598 (Wound Care each add'l 20 sq cm) []  97016 (Vasopneumatic Device) []  [id number] Public affairs consultant) []  [id number] (Prosthetic Training)

## 2023-11-20 NOTE — Progress Notes (Signed)
 [name] [address] [place] [address] Phone: [phone] Subjective:   [name], am serving as a scribe for Dr. Antoine Primas.  I'm seeing this patient by the request  of:  [name], Georgina Quint, MD  CC: Right shoulder pain  [id number]  10/11/2023 Concern with patient having bilateral weakness of the upper extremities.  Patient does have weakness noted in the C6 distribution as well.  Discussed the possibility of cervical radiculopathy.  Patient though the x-rays do show some arthritic changes very high up as well as some C5-C6 degenerative disc disease.  Discussed gabapentin which patient is somewhat reluctant to start but did give patient a prescription in case she decides otherwise.  Differential is quite broad at the moment.  Rotator cuff arthropathy is within the differential as well but seems to have fairly good active range of motion.  If continuing to have difficulty after physical therapy do feel that further imaging especially of the neck as well as possible nerve conduction studies could be beneficial.  Patient is in agreement with the plan.  Follow-up with me again 2 months to see how she is doing     Updated 11/22/2023 Lisa [name] is a 78 y.o. female coming in with complaint of B shoulder pain. R shoulder is doing well. Patient has L scapular and shoulder pain with resisted work in PT. Working on posture with therapist. After PT, patient will have anterior shoulder pain. Continues using Meloxicam and Vit D. Patient no longer had tingling in arms after increasing dosage to 3000IU of Vit D daily.  Patient did not try gabapentin.        Past Medical History:  Diagnosis Date   Arthritis    Cataract 2009   right-Dr. Nile Riggs   Hyperlipidemia    Hypertension    Post-operative nausea and vomiting    Retinal tear 2008   B- Dr. Gregery Na   Vitamin D deficiency    Past Surgical History:  Procedure Laterality Date    APPENDECTOMY  1963   CATARACT EXTRACTION     Dr. Nile Riggs   POLYPECTOMY     RETINAL TEAR REPAIR CRYOTHERAPY     Dr. Gregery Na   Social History   Socioeconomic History   Marital status: Single    Spouse name: Not on file   Number of children: Not on file   Years of education: Not on file   Highest education level: Not on file  Occupational History   Occupation: nutritionist [employer]  Tobacco Use   Smoking status: Never   Smokeless tobacco: Never  Vaping Use   Vaping status: Never Used  Substance and Sexual Activity   Alcohol use: Yes    Comment: wine occ. every week per pt   Drug use: No   Sexual activity: Not Currently  Other Topics Concern   Not on file  Social History Narrative   Regular exercise- Yes   Lives alone.   Social Drivers of Corporate investment banker Strain: Low Risk  (08/26/2023)   Overall Financial Resource Strain (CARDIA)    Difficulty of Paying Living Expenses: Not very hard  Food Insecurity: No Food Insecurity (08/26/2023)   Hunger Vital Sign    Worried About Running Out of Food in the Last Year: Never true    Ran Out of Food in the Last Year: Never true  Transportation Needs: No Transportation Needs (08/26/2023)   PRAPARE - Administrator, Civil Service (  Medical): No    Lack of Transportation (Non-Medical): No  Physical Activity: Inactive (08/26/2023)   Exercise Vital Sign    Days of Exercise per Week: 0 days    Minutes of Exercise per Session: 0 min  Stress: Stress Concern Present (08/26/2023)   [employer] of Occupational Health - Occupational Stress Questionnaire    Feeling of Stress : To some extent  Social Connections: Moderately Integrated (08/26/2023)   Social Connection and Isolation Panel [NHANES]    Frequency of Communication with Friends and Family: More than three times a week    Frequency of Social Gatherings with Friends and Family: Three times a week    Attends Religious Services: More than 4 times per year     Active Member of Clubs or Organizations: Yes    Attends Banker Meetings: Never    Marital Status: Never married   Allergies  Allergen Reactions   Atorvastatin     REACTION: achy   Ezetimibe-Simvastatin     REACTION: myalgia   Fish Oil     REACTION: bruising   Livalo [Pitavastatin Calcium]     pain   Lovastatin     REACTION: myalgias   Rosuvastatin     REACTION: myalgia   Zetia [Ezetimibe]     Side effects   Family History  Problem Relation Age of Onset   COPD Father    Heart disease Father    COPD Sister    Hyperlipidemia Other    Alzheimer's disease Mother    Colon cancer Maternal Aunt        60's   Stomach cancer Maternal Aunt     Current Outpatient Medications (Endocrine & Metabolic):    metFORMIN (GLUCOPHAGE) 500 MG tablet, TAKE 1 TABLET BY MOUTH EVERY DAY  Current Outpatient Medications (Cardiovascular):    lisinopril-hydrochlorothiazide (ZESTORETIC) 10-12.5 MG tablet, Take 1 tablet by mouth daily.   VASCEPA 1 g capsule, TAKE 2 CAPSULES BY MOUTH TWICE A DAY   Current Outpatient Medications (Analgesics):    meloxicam (MOBIC) 7.5 MG tablet, Take 1 tablet (7.5 mg total) by mouth daily as needed for pain.  Current Outpatient Medications (Hematological):    cyanocobalamin 1000 MCG tablet, Take 1,000 mcg by mouth daily.   Current Outpatient Medications (Other):    ascorbic acid (VITAMIN C) 500 MG tablet, Take by mouth.   cholecalciferol (VITAMIN D) 1000 UNITS tablet, Take 3,000 Units by mouth every 3 (three) days.   gabapentin (NEURONTIN) 100 MG capsule, Take 1 capsule (100 mg total) by mouth at bedtime.   Glucosamine HCl 500 MG TABS, Take by mouth 2 (two) times daily.    SODIUM FLUORIDE 5000 PPM 1.1 % PSTE, Take by mouth.   Turmeric 500 MG TABS, Take 2 capsules by mouth 2 (two) times daily.   Reviewed prior external information including notes and imaging from  primary care provider As well as notes that were available from care everywhere and  other healthcare systems.  Past medical history, social, surgical and family history all reviewed in electronic medical record.  No pertanent information unless stated regarding to the chief complaint.   Review of Systems:  No headache, visual changes, nausea, vomiting, diarrhea, constipation, dizziness, abdominal pain, skin rash, fevers, chills, night sweats, weight loss, swollen lymph nodes, body aches, joint swelling, chest pain, shortness of breath, mood changes. POSITIVE muscle aches  Objective  Blood pressure 110/74, pulse 84, height 4\' 11"  (1.499 m), weight 122 lb (55.3 kg), SpO2 99%.   General: No  apparent distress alert and oriented x3 mood and affect normal, dressed appropriately.  HEENT: Pupils equal, extraocular movements intact  Respiratory: Patient's speak in full sentences and does not appear short of breath  Cardiovascular: No lower extremity edema, non tender, no erythema  Left shoulder exam does have some scapular dyskinesis noted.  Some tenderness to palpation diffusely.  Patient does have multiple trigger points noted in the rhomboid, latissimus dorsi and trapezius.  After verbal consent patient was prepped with alcohol swab and with a 25-gauge half inch needle injected in 4 distinct trigger points in the trapezius, latissimus dorsi and rhomboid area.  Patient tolerated the procedure well.  Minimal blood loss.  Band-Aids placed.  Postinjection instructions given    Impression and Recommendations:    The above documentation has been reviewed and is accurate and complete Judi Saa, DO

## 2023-11-22 ENCOUNTER — Other Ambulatory Visit: Payer: Self-pay

## 2023-11-22 ENCOUNTER — Encounter: Payer: Self-pay | Admitting: Family Medicine

## 2023-11-22 ENCOUNTER — Ambulatory Visit

## 2023-11-22 ENCOUNTER — Ambulatory Visit: Payer: Medicare PPO | Admitting: Family Medicine

## 2023-11-22 VITALS — BP 110/74 | HR 84 | Ht 59.0 in | Wt 122.0 lb

## 2023-11-22 DIAGNOSIS — M25511 Pain in right shoulder: Secondary | ICD-10-CM

## 2023-11-22 DIAGNOSIS — M25512 Pain in left shoulder: Secondary | ICD-10-CM | POA: Diagnosis not present

## 2023-11-22 DIAGNOSIS — M19012 Primary osteoarthritis, left shoulder: Secondary | ICD-10-CM | POA: Diagnosis not present

## 2023-11-22 NOTE — Assessment & Plan Note (Signed)
 Patient given trigger point injections and tolerated the procedure well, discussed icing regimen and home exercises, which activities to do and which ones to avoid.  Will continue with formal physical therapy.  Will get x-rays to further evaluate the left shoulder to make sure that there is no other bony abnormality potential bleeding.  Very hopeful that this will make some improvement.  Discussed icing regimen.  Increase activity slowly.  Follow-up again in 6 to 8 weeks.

## 2023-11-22 NOTE — Patient Instructions (Addendum)
 Xray today Trigger point today Send [place] a message after PT See you again in 2 months

## 2023-11-25 ENCOUNTER — Ambulatory Visit (HOSPITAL_BASED_OUTPATIENT_CLINIC_OR_DEPARTMENT_OTHER): Payer: Medicare PPO

## 2023-11-25 ENCOUNTER — Encounter (HOSPITAL_BASED_OUTPATIENT_CLINIC_OR_DEPARTMENT_OTHER): Payer: Self-pay

## 2023-11-25 DIAGNOSIS — M6281 Muscle weakness (generalized): Secondary | ICD-10-CM | POA: Diagnosis not present

## 2023-11-25 DIAGNOSIS — G8929 Other chronic pain: Secondary | ICD-10-CM | POA: Diagnosis not present

## 2023-11-25 DIAGNOSIS — M25512 Pain in left shoulder: Secondary | ICD-10-CM | POA: Diagnosis not present

## 2023-11-25 DIAGNOSIS — M25511 Pain in right shoulder: Secondary | ICD-10-CM | POA: Diagnosis not present

## 2023-11-25 NOTE — Therapy (Signed)
 OUTPATIENT PHYSICAL THERAPY UPPER EXTREMITY TREATMENT   Patient Name: Lisa [name] MRN: [id number] DOB:[birth year], 78 y.o., female Today's Date: 11/25/2023  END OF SESSION:  PT End of Session - 11/25/23 1309     Visit Number 5    Number of Visits 16    Date for PT Re-Evaluation 01/21/24    Authorization Type [employer] MCR    PT Start Time 1304    PT Stop Time 1350    PT Time Calculation (min) 46 min    Activity Tolerance Patient tolerated treatment well    Behavior During Therapy [facility] for tasks assessed/performed                 Past Medical History:  Diagnosis Date   Arthritis    Cataract 2009   right-Dr. Nile Riggs   Hyperlipidemia    Hypertension    Post-operative nausea and vomiting    Retinal tear 2008   B- Dr. Gregery Na   Vitamin D deficiency    Past Surgical History:  Procedure Laterality Date   APPENDECTOMY  1963   CATARACT EXTRACTION     Dr. Nile Riggs   POLYPECTOMY     RETINAL TEAR REPAIR CRYOTHERAPY     Dr. Gregery Na   Patient Active Problem List   Diagnosis Date Noted   Trigger point of left shoulder region 11/22/2023   Cat bite 12/04/2022   Shoulder pain, bilateral 02/07/2022   History of COVID-19 10/10/2021   Head contusion 07/06/2021   Piriformis muscle pain 07/07/2020   Right ankle pain 07/07/2020   Dysuria 08/24/2019   Low back pain 08/24/2019   Laceration of skin of right knee 07/12/2019   Bruit of right carotid artery 07/16/2018   Abdominal pain 01/06/2018   Right knee pain 12/21/2016   Hip flexor tightness 11/30/2015   Piriformis syndrome of left side 04/20/2014   IT band syndrome 04/20/2014   Frequent falls 04/20/2014   Diabetes mellitus type 2 without retinopathy (HCC) 02/19/2013   Well adult exam 07/13/2012   Left lumbar radiculopathy 04/08/2012   Macular hole 02/28/2012   Status post intraocular lens implant 02/28/2012   Vitamin D deficiency 04/30/2008   Dyslipidemia 10/31/2007   RECENT RETINAL DETACHMENT PARTIAL W/GIANT TEAR  10/31/2007   Essential hypertension 10/31/2007    PCP: Tresa Garter, MD   REFERRING PROVIDER:  Judi Saa, DO     REFERRING DIAG:  [id number] (ICD-10-CM) - Bilateral shoulder pain, unspecified chronicity      THERAPY DIAG:  Chronic right shoulder pain  Chronic left shoulder pain  Muscle weakness (generalized)  Rationale for Evaluation and Treatment: Rehabilitation  ONSET DATE: 2023 Post COVID-19   SUBJECTIVE:  SUBJECTIVE STATEMENT: Pt reports she saw MD and had 4 triggerpoint injections on Friday. She believes these did help a little. However, she had a large tree fall in her road yesterday and helped drag limbs away. Had increased pain last night after this. Has not done much since. Still sore, but "could be worse."   Eval: Pt states she has has shoulder and UE pain with previous NT. It has recently started getting better with upping her vitamin D dosage. Pt noted this started after getting sick in 2023. She noticed more UE weakness and pain. Pt notices more shoulder discomfort. Her hands are weaker with most difficulty opening a jar. Pt notes tightness into bilat shoulders with anything behind. Pt has been doing some of the MD exercises with some relief. Pins and needles do no go back the elbow. Pt is side sleeper. 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 Pt denies neck position causing pain.   Hand dominance: Right  PERTINENT HISTORY: LBP  PAIN:  Are you having pain? Yes: NPRS scale: 3/10 Pain location: bilat shoulders and upper arms Pain description: achy  Aggravating factors: reaching OH, lifting, reaching BHB, pushing a wheelchair Relieving factors: rest, heat  PRECAUTIONS: None  RED FLAGS: Cervical red flags: Dysphagia No, Dysmetria No, Diplopia No, Nystagmus No,  and Nausea No and Spinal tumors: Yes, pt does report 1-2x/week of waking up in night sweats (advised on red flag answers to malignancy) unintentional weight loss but <10lbs  WEIGHT BEARING RESTRICTIONS: No  FALLS:  Has patient fallen in last 6 months? 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  DIAGNOSTIC FINDINGS:  FINDINGS: Minimal grade 1 anterolisthesis of C5-6 is noted secondary to posterior facet joint hypertrophy. Moderate degenerative disc disease is noted at C3-4, C4-5 and C6-7. No acute fracture is noted.   IMPRESSION: Multilevel degenerative disc disease.  No acute abnormality seen.  PATIENT SURVEYS :  QuickDASH Score: 38.6 / 100 = 38.6 %   COGNITION: Overall cognitive status: Within functional limits for tasks assessed     SENSATION: WFL  POSTURE: Increased kyphosis, fwd head, rounded shoulders   UPPER EXTREMITY ROM: tightness that is "almost pain"  Active ROM Right eval Left eval  Shoulder flexion 150 150  Shoulder extension    Shoulder abduction 150 150  Shoulder adduction    Shoulder internal rotation L2 reach L2  reach  Shoulder external rotation T2 reach T2  Elbow flexion    Elbow extension    Wrist flexion    Wrist extension    Wrist ulnar deviation    Wrist radial deviation    Wrist pronation    Wrist supination    (Blank rows = not tested)  UPPER EXTREMITY MMT:  MMT Right eval Left eval  Shoulder flexion 4/5 4/5  Shoulder extension    Shoulder abduction 4 pain 4 pain  Shoulder adduction    Shoulder internal rotation 4+ 4+  Shoulder external rotation 4 4  Middle trapezius 4 4   Lower trapezius    Elbow flexion 4 4  Elbow extension    Wrist flexion 4+ 4+  Wrist extension    Wrist ulnar deviation  Wrist radial deviation    Wrist pronation    Wrist supination    Grip strength (lbs)    (Blank rows = not tested)  SHOULDER SPECIAL TESTS: Impingement tests: Painful arc test: negative Rotator cuff assessment: Empty can test: negative, External rotation lag sign: negative, and Belly press test: negative Biceps assessment: Speed's test: negative  JOINT MOBILITY TESTING:  Bilateral GHJ inferior and posterior glide stiffness  PALPATION:  TTP of bilat deltoid, C/S paraspinals, and midtrap/rhomboids                                                                                                                             TREATMENT DATE:   Treatment                              11/25/23: Blank lines following charge title = not provided on this treatment date.   Manual:  TPDN No PROM bil shoulders, STM to L UT, LS,pecs, biceps and medial border of L scapula Scapular mobilizations There-ex: Supine ABC with 1# B, cues for scap retraction Rhythmic stabilization L UE with cues for scap retraction   11/18/23: Blank lines following charge title = not provided on this treatment date.   Manual:  TPDN No PROM bil shoulders, STM to L UT, LS,pecs, and medial border of L scapula There-ex: Supine H abduction small range with YTB wrapped around hands 2x10 Supine ABC with 1# x3 B, cues for scap retraction Rhythmic stabilization L UE with cues for scap retraction Theraband row with tactile cues- RTB 2x10 Theraband extension- RTB 2x10  There-Act:  Self Care:  Nuro-Re-ed:    Treatment                             11/04/23: Blank lines following charge title = not provided on this treatment date.   Manual:  TPDN No PROM bil shoulders, STM to L UT, LS, and medial border of L scapula There-ex: S/l open book x10 5" hold(grinding felt) Supine flexion with  RTB for serratus activation x5 (L GHJ crepitus) Upper trap stretching L Supine ABC with 1# x2 L, cues for scap retraction Rhythmic stabilization L UE with cues for scap retraction     Treatment                             10/28/23: Blank lines following charge title = not provided on this treatment date.   Manual:  TPDN No PROM bil shoulders, STM to bil UT There-ex:  There-Act:  Self Care:  Nuro-Re-ed: PNF D2 x15ea Supine ABC 1# x1ea Supine horizontal abduction 2x10 Seated bilateral ER 2x10 Row with GTB and tactile cues 2x15 Wall angels 2x10     2/5   Exercises - Shoulder External Rotation and Scapular Retraction with Resistance  - 1 x  daily - 7 x weekly - 2 sets - 10 reps - Standing Shoulder Horizontal Abduction with Resistance  - 1 x daily - 7 x weekly - 2 sets - 10 reps - Single Arm Doorway Pec Stretch at 90 Degrees Abduction  - 2 x daily - 7 x weekly - 1 sets - 3 reps - 30 hold - Seated Thoracic Lumbar Extension  - 1 x daily - 7 x weekly - 2 sets - 10 reps - 3 hold - Standing Shoulder Row with Anchored Resistance  - 1 x daily - 7 x weekly - 3 sets - 10 reps   PATIENT EDUCATION: Education details: MOI, diagnosis, prognosis, anatomy, exercise progression, DOMS expectations, muscle firing,  envelope of function, HEP, POC  Person educated: Patient Education method: Explanation, Demonstration, Tactile cues, Verbal cues, and Handouts Education comprehension: verbalized understanding, returned demonstration, verbal cues required, and tactile cues required  HOME EXERCISE PROGRAM:  Access Code: [email or web address] URL: [email or web address] Date: 10/23/2023 Prepared by: Zebedee Iba  ASSESSMENT:  CLINICAL IMPRESSION:  Continued with manual techniques to decrease restrictions in L scapular region. Remains tender along scapular border and in UT . No grinding today noted with PROM and stabilization activities. Reviewed use of theracane for potential use for self  TPR at home.    Eval: Patient is a 78 y.o. female who was seen today for physical therapy evaluation and treatment for c/c of bilat shoulder pain. Pt's s/s appear consistent with potential cervical radicular pain. However, does not present with myotomal weakness. Shoulder and hand weakness appears to be deconditioning in nature. Pt's pain is minimally sensitive and irritable with movement. Pt's is more strength limited at this time but T/S kyphosis and cervical position are also likely contributing limited shoulder mobility. Plan to continue with UE strength and postural corrections at future sessions. Pt would benefit from continued skilled therapy in order to reach goals and maximize functional bilat UE strength and ROM for full return to PLOF.     OBJECTIVE IMPAIRMENTS: decreased ROM, decreased strength, hypomobility, impaired flexibility, impaired UE functional use, improper body mechanics, postural dysfunction, and pain.   ACTIVITY LIMITATIONS: carrying, lifting, bathing, dressing, and reach over head  PARTICIPATION LIMITATIONS: cleaning, driving, shopping, community activity, and yard work  PERSONAL FACTORS: Age, Fitness, Time since onset of injury/illness/exacerbation, and 1-2 comorbidities:    are also affecting patient's functional outcome.   REHAB POTENTIAL: Good  CLINICAL DECISION MAKING: Stable/uncomplicated  EVALUATION COMPLEXITY: Low  GOALS:   SHORT TERM GOALS: Target date: 12/04/2023   Pt will become independent with HEP in order to demonstrate synthesis of PT education.  Baseline: Goal status: IN PROGRESS  2.  Pt will decrease quick DASH score by at least 8% in order to demonstrate clinically significant reduction in disability.   Baseline:  Goal status: INITIAL  3.  Pt will be able to demonstrate BHB reach without pain in order to demonstrate functional improvement in UE function for self-care and house hold duties.  Baseline:  Goal status: INITIAL  LONG TERM  GOALS: Target date: 01/15/2024   Pt  will become independent with final HEP in order to demonstrate synthesis of PT education.  Baseline:  Goal status: INITIAL  2.  Pt will be able to reach [facility] and carry/hold >10 lbs in order to demonstrate functional improvement in UE strength for return to house projects and exercise.  Baseline:  Goal status: INITIAL  3.  Pt will decrease quick DASH score by at least 16% in order to  demonstrate clinically significant reduction in disability.   Baseline:  Goal status: INITIAL  4.  Pt will be able to demonstrate/report ability to sit/stand/sleep for extended periods of time without pain in order to demonstrate functional improvement and tolerance to static positioning.  Baseline:  Goal status: INITIAL   PLAN: PT FREQUENCY: 1-2x/week  PT DURATION: 12 weeks  PLANNED INTERVENTIONS: 97164- PT Re-evaluation, 97110-Therapeutic exercises, 97530- Therapeutic activity, [id number]- Neuromuscular re-education, 97535- Self Care, 19147- Manual therapy, [id number]- Aquatic Therapy, 97760- Splinting, 97014- Electrical stimulation (unattended), [id number]- Electrical stimulation (manual), [id number]- Vasopneumatic device, [id number]- Ultrasound, [id number]- Traction (mechanical), [id number]- Ionotophoresis 4mg /ml Dexamethasone, Taping, Dry Needling, Joint mobilization, Joint manipulation, Spinal manipulation, Spinal mobilization, Scar mobilization, DME instructions, Cryotherapy, and Moist heat  PLAN FOR NEXT SESSION: Consider grip testing; wall/table push ups, shoulder ABD isometrics (scare crow), review HEP technique*   Donnel Saxon Jaiceon Collister, PTA 11/25/2023, 2:14 PM  Referring diagnosis? M25.511,M25.512 (ICD-10-CM) - Bilateral shoulder pain, unspecified chronicity Treatment diagnosis? (if different than referring diagnosis) M62.81 What was this (referring dx) caused by? []  Surgery []  Fall [x]  Ongoing issue [x]  Arthritis []  Other: ____________  Laterality: []  Rt []  Lt [x]  Both  Check all  possible CPT codes:      []  97110 (Therapeutic Exercise)  []  92507 (SLP Treatment)  []  97112 (Neuro Re-ed)   []  92526 (Swallowing Treatment)   []  97116 (Gait Training)   []  [id number] (Cognitive Training, 1st 15 minutes) []  97140 (Manual Therapy)   []  97130 (Cognitive Training, each add'l 15 minutes)  []  97530 (Therapeutic Activities)  []  Other, List CPT Code ____________    []  97535 (Self Care)       [x]  All codes above (97110 - 97535)  [x]  97012 (Mechanical Traction)  [x]  97014 (E-stim Unattended)  [x]  97032 (E-stim manual)  [x]  97033 (Ionto)  [x]  97035 (Ultrasound)  [x]  97760 (Orthotic Fit) []  97750 (Physical Performance Training) [x]  [id number] (Aquatic Therapy) []  97034 (Contrast Bath) []  [id number] (Paraffin) []  97597 (Wound Care 1st 20 sq cm) []  97598 (Wound Care each add'l 20 sq cm) []  97016 (Vasopneumatic Device) []  [id number] Public affairs consultant) []  [id number] (Prosthetic Training)

## 2023-12-02 ENCOUNTER — Ambulatory Visit (HOSPITAL_BASED_OUTPATIENT_CLINIC_OR_DEPARTMENT_OTHER): Payer: Medicare PPO | Admitting: Physical Therapy

## 2023-12-02 ENCOUNTER — Encounter (HOSPITAL_BASED_OUTPATIENT_CLINIC_OR_DEPARTMENT_OTHER): Payer: Self-pay | Admitting: Physical Therapy

## 2023-12-02 DIAGNOSIS — M6281 Muscle weakness (generalized): Secondary | ICD-10-CM

## 2023-12-02 DIAGNOSIS — G8929 Other chronic pain: Secondary | ICD-10-CM

## 2023-12-02 DIAGNOSIS — M25512 Pain in left shoulder: Secondary | ICD-10-CM | POA: Diagnosis not present

## 2023-12-02 DIAGNOSIS — M25511 Pain in right shoulder: Secondary | ICD-10-CM | POA: Diagnosis not present

## 2023-12-02 NOTE — Therapy (Signed)
 OUTPATIENT PHYSICAL THERAPY UPPER EXTREMITY TREATMENT   Patient Name: Lisa [name] MRN: [id number] DOB:1946-04-14, 78 y.o., female Today's Date: 12/02/2023  END OF SESSION:  PT End of Session - 12/02/23 1306     Visit Number 6    Number of Visits 16    Date for PT Re-Evaluation 01/21/24    Authorization Type [employer] M[employer]    PT Start Time 1315    PT Stop Time 1355    PT Time Calculation (min) 40 min    Activity Tolerance Patient tolerated treatment well    Behavior During Therapy [facility] for tasks assessed/performed                 Past Medical History:  Diagnosis Date   Arthritis    Cataract 2009   right-Dr. Nile Riggs   Hyperlipidemia    Hypertension    Post-operative nausea and vomiting    Retinal tear 2008   B- Dr. Gregery Na   Vitamin D deficiency    Past Surgical History:  Procedure Laterality Date   APPENDECTOMY  1963   CATARACT EXTRACTION     Dr. Nile Riggs   POLYPECTOMY     RETINAL TEAR REPAIR CRYOTHERAPY     Dr. Gregery Na   Patient Active Problem List   Diagnosis Date Noted   Trigger point of left shoulder region 11/22/2023   Cat bite 12/04/2022   Shoulder pain, bilateral 02/07/2022   History of COVID-19 10/10/2021   Head contusion 07/06/2021   Piriformis muscle pain 07/07/2020   Right ankle pain 07/07/2020   Dysuria 08/24/2019   Low back pain 08/24/2019   Laceration of skin of right knee 07/12/2019   Bruit of right carotid artery 07/16/2018   Abdominal pain 01/06/2018   Right knee pain 12/21/2016   Hip flexor tightness 11/30/2015   Piriformis syndrome of left side 04/20/2014   IT band syndrome 04/20/2014   Frequent falls 04/20/2014   Diabetes mellitus type 2 without retinopathy (HCC) 02/19/2013   Well adult exam 07/13/2012   Left lumbar radiculopathy 04/08/2012   Macular hole 02/28/2012   Status post intraocular lens implant 02/28/2012   Vitamin D deficiency 04/30/2008   Dyslipidemia 10/31/2007   RECENT RETINAL DETACHMENT PARTIAL W/GIANT TEAR  10/31/2007   Essential hypertension 10/31/2007    PCP: Tresa Garter, MD   REFERRING PROVIDER:  Judi Saa, DO     REFERRING DIAG:  [phone] (ICD-10-CM) - Bilateral shoulder pain, unspecified chronicity      THERAPY DIAG:  Chronic right shoulder pain  Chronic left shoulder pain  Muscle weakness (generalized)  Rationale for Evaluation and Treatment: Rehabilitation  ONSET DATE: 2023 Post COVID-19   SUBJECTIVE:  SUBJECTIVE STATEMENT: Pt reports she shoulders are better but still sore with movement. Pt does not notice huge improvement from triggerpoint injections. Pt notes she is still tight.    Eval: Pt states she has has shoulder and UE pain with previous NT. It has recently started getting better with upping her vitamin D dosage. Pt noted this started after getting sick in 2023. She noticed more UE weakness and pain. Pt notices more shoulder discomfort. Her hands are weaker with most difficulty opening a jar. Pt notes tightness into bilat shoulders with anything behind. Pt has been doing some of the MD exercises with some relief. Pins and needles do no go back the elbow. Pt is side sleeper. 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 Pt denies neck position causing pain.   Hand dominance: Right  PERTINENT HISTORY: LBP  PAIN:  Are you having pain? Yes: NPRS scale: 3/10 Pain location: bilat shoulders and upper arms Pain description: achy  Aggravating factors: reaching OH, lifting, reaching BHB, pushing a wheelchair Relieving factors: rest, heat  PRECAUTIONS: None  RED FLAGS: Cervical red flags: Dysphagia No, Dysmetria No, Diplopia No, Nystagmus No, and Nausea No and Spinal tumors: Yes, pt does report 1-2x/week of waking up in night sweats (advised on red flag answers to  malignancy) unintentional weight loss but <10lbs  WEIGHT BEARING RESTRICTIONS: No  FALLS:  Has patient fallen in last 6 months? 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  DIAGNOSTIC FINDINGS:  FINDINGS: Minimal grade 1 anterolisthesis of C5-6 is noted secondary to posterior facet joint hypertrophy. Moderate degenerative disc disease is noted at C3-4, C4-5 and C6-7. No acute fracture is noted.   IMPRESSION: Multilevel degenerative disc disease.  No acute abnormality seen.  PATIENT SURVEYS :  QuickDASH Score: 38.6 / 100 = 38.6 %   COGNITION: Overall cognitive status: Within functional limits for tasks assessed     SENSATION: WFL  POSTURE: Increased kyphosis, fwd head, rounded shoulders   UPPER EXTREMITY ROM: tightness that is "almost pain"  Active ROM Right eval Left eval  Shoulder flexion 150 150  Shoulder extension    Shoulder abduction 150 150  Shoulder adduction    Shoulder internal rotation L2 reach L2  reach  Shoulder external rotation T2 reach T2  Elbow flexion    Elbow extension    Wrist flexion    Wrist extension    Wrist ulnar deviation    Wrist radial deviation    Wrist pronation    Wrist supination    (Blank rows = not tested)  UPPER EXTREMITY MMT:  MMT Right eval Left eval  Shoulder flexion 4/5 4/5  Shoulder extension    Shoulder abduction 4 pain 4 pain  Shoulder adduction    Shoulder internal rotation 4+ 4+  Shoulder external rotation 4 4  Middle trapezius 4 4  Lower trapezius    Elbow flexion 4 4  Elbow extension    Wrist flexion 4+ 4+  Wrist extension    Wrist ulnar deviation     Wrist radial deviation    Wrist pronation    Wrist supination    Grip strength (lbs)    (Blank rows =  not tested)  SHOULDER SPECIAL TESTS: Impingement tests: Painful arc test: negative Rotator cuff assessment: Empty can test: negative, External rotation lag sign: negative, and Belly press test: negative Biceps assessment: Speed's test: negative  JOINT MOBILITY TESTING:  Bilateral GHJ inferior and posterior glide stiffness  PALPATION:  TTP of bilat deltoid, C/S paraspinals, and midtrap/rhomboids                                                                                                                             TREATMENT DATE:   3/17  Bilat shoulder inf and post glide grade III (L tighter than R)  Supine AAROM flexion stretch 2s 2x10 Pec stretch 30s 2x Cane ABD and flexion 2x10 Cane scaption 10x each Wall push up 2x10  Treatment                              11/25/23: Blank lines following charge title = not provided on this treatment date.   Manual:  TPDN No PROM bil shoulders, STM to L UT, LS,pecs, biceps and medial border of L scapula Scapular mobilizations There-ex: Supine ABC with 1# B, cues for scap retraction Rhythmic stabilization L UE with cues for scap retraction   11/18/23: Blank lines following charge title = not provided on this treatment date.   Manual:  TPDN No PROM bil shoulders, STM to L UT, LS,pecs, and medial border of L scapula There-ex: Supine H abduction small range with YTB wrapped around hands 2x10 Supine ABC with 1# x3 B, cues for scap retraction Rhythmic stabilization L UE with cues for scap retraction Theraband row with tactile cues- RTB 2x10 Theraband extension- RTB 2x10  There-Act:  Self Care:  Nuro-Re-ed:    Treatment                             11/04/23: Blank lines following charge title = not provided on this treatment date.   Manual:  TPDN No PROM bil shoulders, STM to L UT, LS, and medial border of L  scapula There-ex: S/l open book x10 5" hold(grinding felt) Supine flexion with RTB for serratus activation x5 (L GHJ crepitus) Upper trap stretching L Supine ABC with 1# x2 L, cues for scap retraction Rhythmic stabilization L UE with cues for scap retraction     Treatment                             10/28/23: Blank lines following charge title = not provided on this treatment date.   Manual:  TPDN No PROM bil shoulders, STM to bil UT There-ex:  There-Act:  Self Care:  Nuro-Re-ed: PNF D2 x15ea Supine ABC 1# x1ea Supine horizontal abduction 2x10 Seated bilateral ER 2x10 Row with GTB and tactile cues 2x15 Wall angels 2x10     2/5  Exercises - Shoulder External Rotation and Scapular Retraction with Resistance  - 1 x daily - 7 x weekly - 2 sets - 10 reps - Standing Shoulder Horizontal Abduction with Resistance  - 1 x daily - 7 x weekly - 2 sets - 10 reps - Single Arm Doorway Pec Stretch at 90 Degrees Abduction  - 2 x daily - 7 x weekly - 1 sets - 3 reps - 30 hold - Seated Thoracic Lumbar Extension  - 1 x daily - 7 x weekly - 2 sets - 10 reps - 3 hold - Standing Shoulder Row with Anchored Resistance  - 1 x daily - 7 x weekly - 3 sets - 10 reps   PATIENT EDUCATION: Education details: MOI, diagnosis, prognosis, anatomy, exercise progression, DOMS expectations, muscle firing,  envelope of function, HEP, POC  Person educated: Patient Education method: Explanation, Demonstration, Tactile cues, Verbal cues, and Handouts Education comprehension: verbalized understanding, returned demonstration, verbal cues required, and tactile cues required  HOME EXERCISE PROGRAM:  Access Code: [email or web address] URL: [email or web address] Date: 10/23/2023 Prepared by: Zebedee Iba  ASSESSMENT:  CLINICAL IMPRESSION:  Joint mobilizations today improve bilat GHJ motion with pt able to reach fully [facility] without starting stiffness and discomfort. Exercise program included AAROM motion to  assist in [facility] reaching tasks. Pt reports soreness with exercise but no pain. HEP frequency also updated. Consider wall slides and light OH reaching at next with resistance if no adverse response. Pt would benefit from continued skilled therapy in order to reach goals and maximize functional bilat UE strength and ROM for full return to PLOF.    Eval: Patient is a 78 y.o. female who was seen today for physical therapy evaluation and treatment for c/c of bilat shoulder pain. Pt's s/s appear consistent with potential cervical radicular pain. However, does not present with myotomal weakness. Shoulder and hand weakness appears to be deconditioning in nature. Pt's pain is minimally sensitive and irritable with movement. Pt's is more strength limited at this time but T/S kyphosis and cervical position are also likely contributing limited shoulder mobility. Plan to continue with UE strength and postural corrections at future sessions. Pt would benefit from continued skilled therapy in order to reach goals and maximize functional bilat UE strength and ROM for full return to PLOF.     OBJECTIVE IMPAIRMENTS: decreased ROM, decreased strength, hypomobility, impaired flexibility, impaired UE functional use, improper body mechanics, postural dysfunction, and pain.   ACTIVITY LIMITATIONS: carrying, lifting, bathing, dressing, and reach over head  PARTICIPATION LIMITATIONS: cleaning, driving, shopping, community activity, and yard work  PERSONAL FACTORS: Age, Fitness, Time since onset of injury/illness/exacerbation, and 1-2 comorbidities:    are also affecting patient's functional outcome.   REHAB POTENTIAL: Good  CLINICAL DECISION MAKING: Stable/uncomplicated  EVALUATION COMPLEXITY: Low  GOALS:   SHORT TERM GOALS: Target date: 12/04/2023   Pt will become independent with HEP in order to demonstrate synthesis of PT education.  Baseline: Goal status: IN PROGRESS  2.  Pt will decrease quick DASH score by  at least 8% in order to demonstrate clinically significant reduction in disability.   Baseline:  Goal status: INITIAL  3.  Pt will be able to demonstrate BHB reach without pain in order to demonstrate functional improvement in UE function for self-care and house hold duties.  Baseline:  Goal status: INITIAL  LONG TERM GOALS: Target date: 01/15/2024   Pt  will become independent with final HEP in order to demonstrate synthesis of PT education.  Baseline:  Goal status: INITIAL  2.  Pt will be able to reach [facility] and carry/hold >10 lbs in order to demonstrate functional improvement in UE strength for return to house projects and exercise.  Baseline:  Goal status: INITIAL  3.  Pt will decrease quick DASH score by at least 16% in order to demonstrate clinically significant reduction in disability.   Baseline:  Goal status: INITIAL  4.  Pt will be able to demonstrate/report ability to sit/stand/sleep for extended periods of time without pain in order to demonstrate functional improvement and tolerance to static positioning.  Baseline:  Goal status: INITIAL   PLAN: PT FREQUENCY: 1-2x/week  PT DURATION: 12 weeks  PLANNED INTERVENTIONS: 97164- PT Re-evaluation, 97110-Therapeutic exercises, 97530- Therapeutic activity, [id number]- Neuromuscular re-education, 97535- Self Care, 84132- Manual therapy, [id number]- Aquatic Therapy, 97760- Splinting, 97014- Electrical stimulation (unattended), [id number]- Electrical stimulation (manual), [id number]- Vasopneumatic device, [id number]- Ultrasound, [id number]- Traction (mechanical), [id number]- Ionotophoresis 4mg /ml Dexamethasone, Taping, Dry Needling, Joint mobilization, Joint manipulation, Spinal manipulation, Spinal mobilization, Scar mobilization, DME instructions, Cryotherapy, and Moist heat  PLAN FOR NEXT SESSION: Consider grip testing; wall/table push ups, shoulder ABD isometrics (scare crow), review HEP techniqueZebedee Iba, PT 12/02/2023, 2:00 PM

## 2023-12-11 ENCOUNTER — Ambulatory Visit (HOSPITAL_BASED_OUTPATIENT_CLINIC_OR_DEPARTMENT_OTHER): Payer: Medicare PPO | Admitting: Physical Therapy

## 2023-12-11 ENCOUNTER — Encounter: Payer: Self-pay | Admitting: Family Medicine

## 2023-12-11 ENCOUNTER — Encounter (HOSPITAL_BASED_OUTPATIENT_CLINIC_OR_DEPARTMENT_OTHER): Payer: Self-pay | Admitting: Physical Therapy

## 2023-12-11 DIAGNOSIS — M25511 Pain in right shoulder: Secondary | ICD-10-CM | POA: Diagnosis not present

## 2023-12-11 DIAGNOSIS — G8929 Other chronic pain: Secondary | ICD-10-CM | POA: Diagnosis not present

## 2023-12-11 DIAGNOSIS — M6281 Muscle weakness (generalized): Secondary | ICD-10-CM

## 2023-12-11 DIAGNOSIS — M25512 Pain in left shoulder: Secondary | ICD-10-CM | POA: Diagnosis not present

## 2023-12-11 NOTE — Therapy (Signed)
 OUTPATIENT PHYSICAL THERAPY UPPER EXTREMITY TREATMENT   Patient Name: Lisa [name] MRN: [id number] DOB:Dec 15, 1945, 78 y.o., female Today's Date: 12/11/2023  END OF SESSION:  PT End of Session - 12/11/23 1444     Visit Number 7    Number of Visits 16    Date for PT Re-Evaluation 01/21/24    Authorization Type [employer] M[employer]    PT Start Time 1400    PT Stop Time 1440    PT Time Calculation (min) 40 min    Activity Tolerance Patient tolerated treatment well    Behavior During Therapy [facility] for tasks assessed/performed                  Past Medical History:  Diagnosis Date   Arthritis    Cataract 2009   right-Dr. Nile Riggs   Hyperlipidemia    Hypertension    Post-operative nausea and vomiting    Retinal tear 2008   B- Dr. Gregery Na   Vitamin D deficiency    Past Surgical History:  Procedure Laterality Date   APPENDECTOMY  1963   CATARACT EXTRACTION     Dr. Nile Riggs   POLYPECTOMY     RETINAL TEAR REPAIR CRYOTHERAPY     Dr. Gregery Na   Patient Active Problem List   Diagnosis Date Noted   Trigger point of left shoulder region 11/22/2023   Cat bite 12/04/2022   Shoulder pain, bilateral 02/07/2022   History of COVID-19 10/10/2021   Head contusion 07/06/2021   Piriformis muscle pain 07/07/2020   Right ankle pain 07/07/2020   Dysuria 08/24/2019   Low back pain 08/24/2019   Laceration of skin of right knee 07/12/2019   Bruit of right carotid artery 07/16/2018   Abdominal pain 01/06/2018   Right knee pain 12/21/2016   Hip flexor tightness 11/30/2015   Piriformis syndrome of left side 04/20/2014   IT band syndrome 04/20/2014   Frequent falls 04/20/2014   Diabetes mellitus type 2 without retinopathy (HCC) 02/19/2013   Well adult exam 07/13/2012   Left lumbar radiculopathy 04/08/2012   Macular hole 02/28/2012   Status post intraocular lens implant 02/28/2012   Vitamin D deficiency 04/30/2008   Dyslipidemia 10/31/2007   RECENT RETINAL DETACHMENT PARTIAL W/GIANT TEAR  10/31/2007   Essential hypertension 10/31/2007    PCP: Tresa Garter, MD   REFERRING PROVIDER:  Judi Saa, DO     REFERRING DIAG:  [id number] (ICD-10-CM) - Bilateral shoulder pain, unspecified chronicity      THERAPY DIAG:  Chronic right shoulder pain  Chronic left shoulder pain  Muscle weakness (generalized)  Rationale for Evaluation and Treatment: Rehabilitation  ONSET DATE: 2023 Post COVID-19   SUBJECTIVE:  SUBJECTIVE STATEMENT: Pt states she is doing well. She is trying to build strength in the shoulders. She recently had x-ray that showed moderate OA. Her goal is still to be able to do things on her own. She has had less shoulder pain.    Eval: Pt states she has has shoulder and UE pain with previous NT. It has recently started getting better with upping her vitamin D dosage. Pt noted this started after getting sick in 2023. She noticed more UE weakness and pain. Pt notices more shoulder discomfort. Her hands are weaker with most difficulty opening a jar. Pt notes tightness into bilat shoulders with anything behind. Pt has been doing some of the MD exercises with some relief. Pins and needles do no go back the elbow. Pt is side sleeper. 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 Pt denies neck position causing pain.   Hand dominance: Right  PERTINENT HISTORY: LBP  PAIN:  Are you having pain? Yes: NPRS scale: 3/10 Pain location: bilat shoulders and upper arms Pain description: achy  Aggravating factors: reaching OH, lifting, reaching BHB, pushing a wheelchair Relieving factors: rest, heat  PRECAUTIONS: None  RED FLAGS: Cervical red flags: Dysphagia No, Dysmetria No, Diplopia No, Nystagmus No, and Nausea No and Spinal tumors: Yes, pt does report 1-2x/week of waking up in  night sweats (advised on red flag answers to malignancy) unintentional weight loss but <10lbs  WEIGHT BEARING RESTRICTIONS: No  FALLS:  Has patient fallen in last 6 months? 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  DIAGNOSTIC FINDINGS:  FINDINGS: Minimal grade 1 anterolisthesis of C5-6 is noted secondary to posterior facet joint hypertrophy. Moderate degenerative disc disease is noted at C3-4, C4-5 and C6-7. No acute fracture is noted.   IMPRESSION: Multilevel degenerative disc disease.  No acute abnormality seen.  FINDINGS: No fracture or malalignment. Moderate AC joint and glenohumeral degenerative change. Left apex is clear   IMPRESSION: Moderate degenerative changes.     Electronically Signed   By: Jasmine Pang M.D.   On: 12/10/2023 23:56  PATIENT SURVEYS :  QuickDASH Score: 38.6 / 100 = 38.6 %   COGNITION: Overall cognitive status: Within functional limits for tasks assessed     SENSATION: WFL  POSTURE: Increased kyphosis, fwd head, rounded shoulders   UPPER EXTREMITY ROM: tightness that is "almost pain"  Active ROM Right eval Left eval  Shoulder flexion 150 150  Shoulder extension    Shoulder abduction 150 150  Shoulder adduction    Shoulder internal rotation L2 reach L2  reach  Shoulder external rotation T2 reach T2  Elbow flexion    Elbow extension    Wrist flexion    Wrist extension    Wrist ulnar deviation    Wrist radial deviation    Wrist pronation    Wrist supination    (Blank rows = not tested)  UPPER EXTREMITY MMT:  MMT Right eval Left eval  Shoulder flexion 4/5 4/5  Shoulder  extension    Shoulder abduction 4 pain 4 pain  Shoulder adduction    Shoulder internal rotation 4+ 4+  Shoulder external rotation 4 4  Middle trapezius 4 4  Lower trapezius    Elbow flexion 4 4  Elbow extension    Wrist flexion 4+ 4+  Wrist extension    Wrist ulnar deviation    Wrist radial deviation    Wrist pronation    Wrist supination    Grip strength (lbs)    (Blank rows = not tested)  SHOULDER SPECIAL TESTS: Impingement tests: Painful arc test: negative Rotator cuff assessment: Empty can test: negative, External rotation lag sign: negative, and Belly press test: negative Biceps assessment: Speed's test: negative  JOINT MOBILITY TESTING:  Bilateral GHJ inferior and posterior glide stiffness  PALPATION:  TTP of bilat deltoid, C/S paraspinals, and midtrap/rhomboids                                                                                                                             TREATMENT DATE:   3/26  Pulley's 1 min ABD and flexion  S/L ER 2x10 2lbs Scaption 2lbs 2x10 Shoulder ABD 2lbs 2x10 Table push up 3x8 YTB horizontal ABD 3x8 Shoulder flexion with horizontal ABD loop  2x10 Wall angel 2x10  3/17  Bilat shoulder inf and post glide grade III (L tighter than R)  Supine AAROM flexion stretch 2s 2x10 Pec stretch 30s 2x Cane ABD and flexion 2x10 Cane scaption 10x each Wall push up 2x10  Treatment                              11/25/23: Blank lines following charge title = not provided on this treatment date.   Manual:  TPDN No PROM bil shoulders, STM to L UT, LS,pecs, biceps and medial border of L scapula Scapular mobilizations There-ex: Supine ABC with 1# B, cues for scap retraction Rhythmic stabilization L UE with cues for scap retraction   11/18/23: Blank lines following charge title = not provided on this treatment date.   Manual:  TPDN No PROM bil shoulders, STM to L UT, LS,pecs, and medial border of L scapula There-ex: Supine H  abduction small range with YTB wrapped around hands 2x10 Supine ABC with 1# x3 B, cues for scap retraction Rhythmic stabilization L UE with cues for scap retraction Theraband row with tactile cues- RTB 2x10 Theraband extension- RTB 2x10  There-Act:  Self Care:  Nuro-Re-ed:    Treatment                             11/04/23: Blank lines following charge title = not provided on this treatment date.   Manual:  TPDN No PROM bil shoulders, STM to L UT, LS, and medial border of L scapula There-ex: S/l open book x10 5" hold(grinding felt) Supine flexion with RTB for serratus activation x5 (L GHJ crepitus) Upper trap stretching L Supine ABC with 1# x2 L, cues for scap retraction Rhythmic stabilization L UE with cues for scap retraction  Treatment                             10/28/23: Blank lines following charge title = not provided on this treatment date.   Manual:  TPDN No PROM bil shoulders, STM to bil UT There-ex:  There-Act:  Self Care:  Nuro-Re-ed: PNF D2 x15ea Supine ABC 1# x1ea Supine horizontal abduction 2x10 Seated bilateral ER 2x10 Row with GTB and tactile cues 2x15 Wall angels 2x10     2/5   Exercises - Shoulder External Rotation and Scapular Retraction with Resistance  - 1 x daily - 7 x weekly - 2 sets - 10 reps - Standing Shoulder Horizontal Abduction with Resistance  - 1 x daily - 7 x weekly - 2 sets - 10 reps - Single Arm Doorway Pec Stretch at 90 Degrees Abduction  - 2 x daily - 7 x weekly - 1 sets - 3 reps - 30 hold - Seated Thoracic Lumbar Extension  - 1 x daily - 7 x weekly - 2 sets - 10 reps - 3 hold - Standing Shoulder Row with Anchored Resistance  - 1 x daily - 7 x weekly - 3 sets - 10 reps   PATIENT EDUCATION: Education details: MOI, diagnosis, prognosis, anatomy, exercise progression, DOMS expectations, muscle firing,  envelope of function, HEP, POC  Person educated: Patient Education method: Explanation, Demonstration, Tactile  cues, Verbal cues, and Handouts Education comprehension: verbalized understanding, returned demonstration, verbal cues required, and tactile cues required  HOME EXERCISE PROGRAM:  Access Code: [email or web address] URL: [email or web address] Date: 10/23/2023 Prepared by: Zebedee Iba  ASSESSMENT:  CLINICAL IMPRESSION:  Patient with very good tolerance through progression of exercises in today's session.  Patient able to demonstrate overhead motion with resistance and start progressive strengthening and full arc of motion without pain.  Patient only complains of tightness during exercise.  Home exercise program updated accordingly with focus on progressive strengthening for her overhead tasks at home.  Plan to recheck with patient at next session and progress strengthening and overhead holds as tolerated.  Plan to decrease frequency of visits as patient is transitioning towards becoming independent with exercise with intermittent therapy management.  Patient progressing very well with therapy with improved neck and shoulder range of motion. pt would benefit from continued skilled therapy in order to reach goals and maximize functional bilat UE strength and ROM for full return to PLOF.    Eval: Patient is a 78 y.o. female who was seen today for physical therapy evaluation and treatment for c/c of bilat shoulder pain. Pt's s/s appear consistent with potential cervical radicular pain. However, does not present with myotomal weakness. Shoulder and hand weakness appears to be deconditioning in nature. Pt's pain is minimally sensitive and irritable with movement. Pt's is more strength limited at this time but T/S kyphosis and cervical position are also likely contributing limited shoulder mobility. Plan to continue with UE strength and postural corrections at future sessions. Pt would benefit from continued skilled therapy in order to reach goals and maximize functional bilat UE strength and ROM for full  return to PLOF.     OBJECTIVE IMPAIRMENTS: decreased ROM, decreased strength, hypomobility, impaired flexibility, impaired UE functional use, improper body mechanics, postural dysfunction, and pain.   ACTIVITY LIMITATIONS: carrying, lifting, bathing, dressing, and reach over head  PARTICIPATION LIMITATIONS: cleaning, driving, shopping, community activity, and yard work  PERSONAL FACTORS: Age, Fitness, Time since onset of injury/illness/exacerbation, and 1-2  comorbidities:    are also affecting patient's functional outcome.   REHAB POTENTIAL: Good  CLINICAL DECISION MAKING: Stable/uncomplicated  EVALUATION COMPLEXITY: Low  GOALS:   SHORT TERM GOALS: Target date: 12/04/2023   Pt will become independent with HEP in order to demonstrate synthesis of PT education.  Baseline: Goal status: IN PROGRESS  2.  Pt will decrease quick DASH score by at least 8% in order to demonstrate clinically significant reduction in disability.   Baseline:  Goal status: INITIAL  3.  Pt will be able to demonstrate BHB reach without pain in order to demonstrate functional improvement in UE function for self-care and house hold duties.  Baseline:  Goal status: INITIAL  LONG TERM GOALS: Target date: 01/15/2024   Pt  will become independent with final HEP in order to demonstrate synthesis of PT education.  Baseline:  Goal status: INITIAL  2.  Pt will be able to reach [facility] and carry/hold >10 lbs in order to demonstrate functional improvement in UE strength for return to house projects and exercise.  Baseline:  Goal status: INITIAL  3.  Pt will decrease quick DASH score by at least 16% in order to demonstrate clinically significant reduction in disability.   Baseline:  Goal status: INITIAL  4.  Pt will be able to demonstrate/report ability to sit/stand/sleep for extended periods of time without pain in order to demonstrate functional improvement and tolerance to static positioning.  Baseline:  Goal  status: INITIAL   PLAN: PT FREQUENCY: 1-2x/week  PT DURATION: 12 weeks  PLANNED INTERVENTIONS: 97164- PT Re-evaluation, 97110-Therapeutic exercises, 97530- Therapeutic activity, [id number]- Neuromuscular re-education, 97535- Self Care, 54627- Manual therapy, [id number]- Aquatic Therapy, 97760- Splinting, 97014- Electrical stimulation (unattended), [id number]- Electrical stimulation (manual), [id number]- Vasopneumatic device, [id number]- Ultrasound, [id number]- Traction (mechanical), [id number]- Ionotophoresis 4mg /ml Dexamethasone, Taping, Dry Needling, Joint mobilization, Joint manipulation, Spinal manipulation, Spinal mobilization, Scar mobilization, DME instructions, Cryotherapy, and Moist heat  PLAN FOR NEXT SESSION: Consider grip testing; wall/table push ups, shoulder ABD isometrics (scare crow), review HEP techniqueZebedee Iba, PT 12/11/2023, 2:46 PM

## 2023-12-16 ENCOUNTER — Ambulatory Visit (HOSPITAL_BASED_OUTPATIENT_CLINIC_OR_DEPARTMENT_OTHER): Payer: Medicare PPO | Admitting: Physical Therapy

## 2023-12-27 ENCOUNTER — Other Ambulatory Visit: Payer: Self-pay | Admitting: Internal Medicine

## 2024-01-06 ENCOUNTER — Ambulatory Visit (HOSPITAL_BASED_OUTPATIENT_CLINIC_OR_DEPARTMENT_OTHER): Attending: Family Medicine

## 2024-01-06 ENCOUNTER — Encounter (HOSPITAL_BASED_OUTPATIENT_CLINIC_OR_DEPARTMENT_OTHER): Payer: Self-pay

## 2024-01-06 DIAGNOSIS — M25511 Pain in right shoulder: Secondary | ICD-10-CM | POA: Insufficient documentation

## 2024-01-06 DIAGNOSIS — M25512 Pain in left shoulder: Secondary | ICD-10-CM | POA: Insufficient documentation

## 2024-01-06 DIAGNOSIS — M6281 Muscle weakness (generalized): Secondary | ICD-10-CM | POA: Diagnosis not present

## 2024-01-06 DIAGNOSIS — G8929 Other chronic pain: Secondary | ICD-10-CM | POA: Insufficient documentation

## 2024-01-06 NOTE — Therapy (Signed)
 OUTPATIENT PHYSICAL THERAPY UPPER EXTREMITY TREATMENT   Patient Name: Lisa [name] MRN: [id number] DOB:[birth year], 78 y.o., female Today's Date: 01/06/2024  END OF SESSION:  PT End of Session - 01/06/24 1425     Visit Number 8    Number of Visits 16    Date for PT Re-Evaluation 01/21/24    Authorization Type [employer] MCR    PT Start Time 1433    PT Stop Time 1515    PT Time Calculation (min) 42 min    Activity Tolerance Patient tolerated treatment well    Behavior During Therapy [facility] for tasks assessed/performed                   Past Medical History:  Diagnosis Date   Arthritis    Cataract 2009   right-Dr. Gennie Kicks   Hyperlipidemia    Hypertension    Post-operative nausea and vomiting    Retinal tear 2008   B- Dr. Larna Plumber   Vitamin D  deficiency    Past Surgical History:  Procedure Laterality Date   APPENDECTOMY  1963   CATARACT EXTRACTION     Dr. Gennie Kicks   POLYPECTOMY     RETINAL TEAR REPAIR CRYOTHERAPY     Dr. Larna Plumber   Patient Active Problem List   Diagnosis Date Noted   Trigger point of left shoulder region 11/22/2023   Cat bite 12/04/2022   Shoulder pain, bilateral 02/07/2022   History of COVID-19 10/10/2021   Head contusion 07/06/2021   Piriformis muscle pain 07/07/2020   Right ankle pain 07/07/2020   Dysuria 08/24/2019   Low back pain 08/24/2019   Laceration of skin of right knee 07/12/2019   Bruit of right carotid artery 07/16/2018   Abdominal pain 01/06/2018   Right knee pain 12/21/2016   Hip flexor tightness 11/30/2015   Piriformis syndrome of left side 04/20/2014   IT band syndrome 04/20/2014   Frequent falls 04/20/2014   Diabetes mellitus type 2 without retinopathy (HCC) 02/19/2013   Well adult exam 07/13/2012   Left lumbar radiculopathy 04/08/2012   Macular hole 02/28/2012   Status post intraocular lens implant 02/28/2012   Vitamin D  deficiency 04/30/2008   Dyslipidemia 10/31/2007   RECENT RETINAL DETACHMENT PARTIAL W/GIANT  TEAR 10/31/2007   Essential hypertension 10/31/2007    PCP: Genia Kettering, MD   REFERRING PROVIDER:  Isidro Margo, DO     REFERRING DIAG:  [phone] (ICD-10-CM) - Bilateral shoulder pain, unspecified chronicity      THERAPY DIAG:  Chronic right shoulder pain  Chronic left shoulder pain  Muscle weakness (generalized)  Rationale for Evaluation and Treatment: Rehabilitation  ONSET DATE: 2023 Post COVID-19   SUBJECTIVE:  SUBJECTIVE STATEMENT: Pt reports no new complaints at entry. Shoulders are improving overall, though still having trouble fixing her hair.    Eval: Pt states she has has shoulder and UE pain with previous NT. It has recently started getting better with upping her vitamin D  dosage. Pt noted this started after getting sick in 2023. She noticed more UE weakness and pain. Pt notices more shoulder discomfort. Her hands are weaker with most difficulty opening a jar. Pt notes tightness into bilat shoulders with anything behind. Pt has been doing some of the MD exercises with some relief. Pins and needles do no go back the elbow. Pt is side sleeper. 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 Pt denies neck position causing pain.   Hand dominance: Right  PERTINENT HISTORY: LBP  PAIN:  Are you having pain? Yes: NPRS scale: 3/10 Pain location: bilat shoulders and upper arms Pain description: achy  Aggravating factors: reaching OH, lifting, reaching BHB, pushing a wheelchair Relieving factors: rest, heat  PRECAUTIONS: None  RED FLAGS: Cervical red flags: Dysphagia No, Dysmetria No, Diplopia No, Nystagmus No, and Nausea No and Spinal tumors: Yes, pt does report 1-2x/week of waking up in night sweats (advised on red flag answers to malignancy) unintentional weight loss but  <10lbs  WEIGHT BEARING RESTRICTIONS: No  FALLS:  Has patient fallen in last 6 months? 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  DIAGNOSTIC FINDINGS:  FINDINGS: Minimal grade 1 anterolisthesis of C5-6 is noted secondary to posterior facet joint hypertrophy. Moderate degenerative disc disease is noted at C3-4, C4-5 and C6-7. No acute fracture is noted.   IMPRESSION: Multilevel degenerative disc disease.  No acute abnormality seen.  FINDINGS: No fracture or malalignment. Moderate AC joint and glenohumeral degenerative change. Left apex is clear   IMPRESSION: Moderate degenerative changes.     Electronically Signed   By: Esmeralda Hedge M.D.   On: 12/10/2023 23:56  PATIENT SURVEYS :  QuickDASH Score: 38.6 / 100 = 38.6 %   COGNITION: Overall cognitive status: Within functional limits for tasks assessed     SENSATION: WFL  POSTURE: Increased kyphosis, fwd head, rounded shoulders   UPPER EXTREMITY ROM: tightness that is "almost pain"  Active ROM Right eval Left eval  Shoulder flexion 150 150  Shoulder extension    Shoulder abduction 150 150  Shoulder adduction    Shoulder internal rotation L2 reach L2  reach  Shoulder external rotation T2 reach T2  Elbow flexion    Elbow extension    Wrist flexion    Wrist extension    Wrist ulnar deviation    Wrist radial deviation    Wrist pronation    Wrist supination    (Blank rows = not tested)  UPPER EXTREMITY MMT:  MMT Right eval Left eval  Shoulder flexion 4/5 4/5  Shoulder extension    Shoulder abduction 4 pain 4 pain  Shoulder adduction    Shoulder  internal rotation 4+ 4+  Shoulder external rotation 4 4  Middle trapezius 4 4  Lower trapezius    Elbow flexion 4 4  Elbow extension    Wrist flexion 4+ 4+  Wrist extension  Wrist ulnar deviation    Wrist radial deviation    Wrist pronation    Wrist supination    Grip strength (lbs)    (Blank rows = not tested)  SHOULDER SPECIAL TESTS: Impingement tests: Painful arc test: negative Rotator cuff assessment: Empty can test: negative, External rotation lag sign: negative, and Belly press test: negative Biceps assessment: Speed's test: negative  JOINT MOBILITY TESTING:  Bilateral GHJ inferior and posterior glide stiffness  PALPATION:  TTP of bilat deltoid, C/S paraspinals, and midtrap/rhomboids                                                                                                                             TREATMENT DATE:    4/21   PROM bil shoulders  Neuro: Bil ER with RTB 2x10 (reviewed form) Supine ABC with 1#, x3bil RTB serratus flexion 2x10 Wall clock RTB 2x5ea bil OH taps at wall with 1# cuff weight 2x20ea  Bal rolls at wall 2.2# ball x15ea bil  3/26  Pulley's 1 min ABD and flexion  S/L ER 2x10 2lbs Scaption 2lbs 2x10 Shoulder ABD 2lbs 2x10 Table push up 3x8 YTB horizontal ABD 3x8 Shoulder flexion with horizontal ABD loop  2x10 Wall angel 2x10    PATIENT EDUCATION: Education details: MOI, diagnosis, prognosis, anatomy, exercise progression, DOMS expectations, muscle firing,  envelope of function, HEP, POC  Person educated: Patient Education method: Explanation, Demonstration, Tactile cues, Verbal cues, and Handouts Education comprehension: verbalized understanding, returned demonstration, verbal cues required, and tactile cues required  HOME EXERCISE PROGRAM:  Access Code: [email or web address] URL: [email or web address] Date: 10/23/2023 Prepared by: Silver Dross  ASSESSMENT:  CLINICAL IMPRESSION:  Continued to progress postural  strength as well as overhead strength and endurance today. She had been performing bil ER with resistance incorrectly at home, so reviewed proper performance with this. Fatigued with OH strengthening and NMC activities, but able to complete these tasks with minimal compensation. Mild "grinding" in scapular region reported with ball rolls when in protracted position. This resolved following cues for retraction. Will continue to progress as tolerated.    Eval: Patient is a 78 y.o. female who was seen today for physical therapy evaluation and treatment for c/c of bilat shoulder pain. Pt's s/s appear consistent with potential cervical radicular pain. However, does not present with myotomal weakness. Shoulder and hand weakness appears to be deconditioning in nature. Pt's pain is minimally sensitive and irritable with movement. Pt's is more strength limited at this time but T/S kyphosis and cervical position are also likely contributing limited shoulder mobility. Plan to continue with UE strength and postural corrections at future sessions. Pt would benefit from continued skilled therapy in order to reach goals and maximize functional bilat UE strength and ROM for full return to PLOF.     OBJECTIVE IMPAIRMENTS: decreased ROM, decreased strength, hypomobility, impaired flexibility, impaired UE functional use, improper body mechanics, postural dysfunction, and pain.   ACTIVITY LIMITATIONS: carrying, lifting, bathing, dressing, and  reach over head  PARTICIPATION LIMITATIONS: cleaning, driving, shopping, community activity, and yard work  PERSONAL FACTORS: Age, Fitness, Time since onset of injury/illness/exacerbation, and 1-2 comorbidities:    are also affecting patient's functional outcome.   REHAB POTENTIAL: Good  CLINICAL DECISION MAKING: Stable/uncomplicated  EVALUATION COMPLEXITY: Low  GOALS:   SHORT TERM GOALS: Target date: 12/04/2023   Pt will become independent with HEP in order to  demonstrate synthesis of PT education.  Baseline: Goal status: IN PROGRESS  2.  Pt will decrease quick DASH score by at least 8% in order to demonstrate clinically significant reduction in disability.   Baseline:  Goal status: INITIAL  3.  Pt will be able to demonstrate BHB reach without pain in order to demonstrate functional improvement in UE function for self-care and house hold duties.  Baseline:  Goal status: INITIAL  LONG TERM GOALS: Target date: 01/15/2024   Pt  will become independent with final HEP in order to demonstrate synthesis of PT education.  Baseline:  Goal status: INITIAL  2.  Pt will be able to reach [facility] and carry/hold >10 lbs in order to demonstrate functional improvement in UE strength for return to house projects and exercise.  Baseline:  Goal status: INITIAL  3.  Pt will decrease quick DASH score by at least 16% in order to demonstrate clinically significant reduction in disability.   Baseline:  Goal status: INITIAL  4.  Pt will be able to demonstrate/report ability to sit/stand/sleep for extended periods of time without pain in order to demonstrate functional improvement and tolerance to static positioning.  Baseline:  Goal status: INITIAL   PLAN: PT FREQUENCY: 1-2x/week  PT DURATION: 12 weeks  PLANNED INTERVENTIONS: 97164- PT Re-evaluation, 97110-Therapeutic exercises, 97530- Therapeutic activity, [id number]- Neuromuscular re-education, 97535- Self Care, 78295- Manual therapy, [id number]- Aquatic Therapy, 97760- Splinting, 97014- Electrical stimulation (unattended), [id number]- Electrical stimulation (manual), [id number]- Vasopneumatic device, [id number]- Ultrasound, [id number]- Traction (mechanical), [id number]- Ionotophoresis 4mg /ml Dexamethasone, Taping, Dry Needling, Joint mobilization, Joint manipulation, Spinal manipulation, Spinal mobilization, Scar mobilization, DME instructions, Cryotherapy, and Moist heat  PLAN FOR NEXT SESSION: Consider grip testing; wall/table push ups,  shoulder ABD isometrics (scare crow), review HEP technique*   Shonna Deiter E Oiva Dibari, PTA 01/06/2024, 5:10 PM

## 2024-01-16 NOTE — Progress Notes (Signed)
 [facility] [address] [place] [address] Phone: [phone] Subjective:   Peggye Bowers am a scribe for Dr. Felipe Horton.    I'm seeing this patient by the request  of:  [name], Oakley Bellman, MD  CC: Bilateral shoulder pain  BJY:[id number]  11/22/2023 Patient given trigger point injections and tolerated the procedure well, discussed icing regimen and home exercises, which activities to do and which ones to avoid. Will continue with formal physical therapy. Will get x-rays to further evaluate the left shoulder to make sure that there is no other bony abnormality potential bleeding. Very hopeful that this will make some improvement. Discussed icing regimen. Increase activity slowly. Follow-up again in 6 to 8 weeks.   Updated 01/17/2024 Carver Clark Breach is a 78 y.o. female coming in with complaint of B shoulder pain, found to have some trigger points noted in the shoulder region.  Has started on formal physical therapy as well.  X-rays of the shoulder did show moderate arthritis mostly of the [facility] joint.  Patient states her shoulder are doing very good. There is some limitations but better than when she first came here.        Past Medical History:  Diagnosis Date   Arthritis    Cataract 2009   right-Dr. Gennie Kicks   Hyperlipidemia    Hypertension    Post-operative nausea and vomiting    Retinal tear 2008   B- Dr. Larna Plumber   Vitamin D  deficiency    Past Surgical History:  Procedure Laterality Date   APPENDECTOMY  1963   CATARACT EXTRACTION     Dr. Gennie Kicks   POLYPECTOMY     RETINAL TEAR REPAIR CRYOTHERAPY     Dr. Larna Plumber   Social History   Socioeconomic History   Marital status: Single    Spouse name: Not on file   Number of children: Not on file   Years of education: Not on file   Highest education level: Not on file  Occupational History   Occupation: nutritionist [employer]  Tobacco Use   Smoking status: Never   Smokeless tobacco:  Never  Vaping Use   Vaping status: Never Used  Substance and Sexual Activity   Alcohol use: Yes    Comment: wine occ. every week per pt   Drug use: No   Sexual activity: Not Currently  Other Topics Concern   Not on file  Social History Narrative   Regular exercise- Yes   Lives alone.   Social Drivers of Corporate investment banker Strain: Low Risk  (08/26/2023)   Overall Financial Resource Strain (CARDIA)    Difficulty of Paying Living Expenses: Not very hard  Food Insecurity: No Food Insecurity (08/26/2023)   Hunger Vital Sign    Worried About Running Out of Food in the Last Year: Never true    Ran Out of Food in the Last Year: Never true  Transportation Needs: No Transportation Needs (08/26/2023)   PRAPARE - Administrator, Civil Service (Medical): No    Lack of Transportation (Non-Medical): No  Physical Activity: Inactive (08/26/2023)   Exercise Vital Sign    Days of Exercise per Week: 0 days    Minutes of Exercise per Session: 0 min  Stress: Stress Concern Present (08/26/2023)   [employer] of Occupational Health - Occupational Stress Questionnaire    Feeling of Stress : To some extent  Social Connections: Moderately Integrated (08/26/2023)   Social Connection and Isolation Panel [NHANES]  Frequency of Communication with Friends and Family: More than three times a week    Frequency of Social Gatherings with Friends and Family: Three times a week    Attends Religious Services: More than 4 times per year    Active Member of Clubs or Organizations: Yes    Attends Banker Meetings: Never    Marital Status: Never married   Allergies  Allergen Reactions   Atorvastatin     REACTION: achy   Ezetimibe-Simvastatin     REACTION: myalgia   Fish Oil     REACTION: bruising   Livalo [Pitavastatin Calcium]     pain   Lovastatin     REACTION: myalgias   Rosuvastatin     REACTION: myalgia   Zetia [Ezetimibe]     Side effects     Objective   Blood pressure 120/70, pulse 95, height 4\' 11"  (1.499 m), weight 123 lb 3.2 oz (55.9 kg), SpO2 97%.   General: No apparent distress alert and oriented x3 mood and affect normal, dressed appropriately.  HEENT: Pupils equal, extraocular movements intact  Respiratory: Patient's speak in full sentences and does not appear short of breath  Cardiovascular: No lower extremity edema, non tender, no erythema  Shoulder exam shows some limited range of motion still to a certain degree but improvement from previous exam.  His strength is significantly improved as well.  Negative Spurling's today.    Impression and Recommendations:    The above documentation has been reviewed and is accurate and complete Deloris Mittag M Anis Degidio, DO

## 2024-01-17 ENCOUNTER — Ambulatory Visit: Admitting: Family Medicine

## 2024-01-17 VITALS — BP 120/70 | HR 95 | Ht 59.0 in | Wt 123.2 lb

## 2024-01-17 DIAGNOSIS — M25512 Pain in left shoulder: Secondary | ICD-10-CM

## 2024-01-17 DIAGNOSIS — M25511 Pain in right shoulder: Secondary | ICD-10-CM

## 2024-01-17 NOTE — Assessment & Plan Note (Signed)
 Known arthritic changes as well as some neck pain.  Doing much better though with the range of motion at the moment.  Patient is and does not want to take any other medicine but did feel that the meloxicam  was the most helpful.  We discussed if she wanted we would do refill for her.  Patient is initially gotten the refill from her primary care provider and will ask them first.  At this point I believe the patient can follow-up with me on an as-needed basis.

## 2024-01-20 ENCOUNTER — Encounter (HOSPITAL_BASED_OUTPATIENT_CLINIC_OR_DEPARTMENT_OTHER): Payer: Self-pay | Admitting: Physical Therapy

## 2024-01-20 ENCOUNTER — Ambulatory Visit (HOSPITAL_BASED_OUTPATIENT_CLINIC_OR_DEPARTMENT_OTHER): Attending: Family Medicine | Admitting: Physical Therapy

## 2024-01-20 DIAGNOSIS — M25512 Pain in left shoulder: Secondary | ICD-10-CM | POA: Insufficient documentation

## 2024-01-20 DIAGNOSIS — M6281 Muscle weakness (generalized): Secondary | ICD-10-CM | POA: Insufficient documentation

## 2024-01-20 DIAGNOSIS — M25511 Pain in right shoulder: Secondary | ICD-10-CM | POA: Diagnosis not present

## 2024-01-20 DIAGNOSIS — G8929 Other chronic pain: Secondary | ICD-10-CM | POA: Insufficient documentation

## 2024-01-20 NOTE — Therapy (Signed)
 OUTPATIENT PHYSICAL THERAPY UPPER EXTREMITY TREATMENT  PHYSICAL THERAPY DISCHARGE SUMMARY  Visits from Start of Care: 9  Current functional le Plan: Patient agrees to discharge.  Patient goals were met. Patient is being discharged due to meeting the stated rehab goals.         Patient Name: Lisa [name] MRN: [id number] DOB:07-01-1946, 78 y.o., female Today's Date: 01/20/2024  END OF SESSION:  PT End of Session - 01/20/24 1313     Visit Number 9    Number of Visits 16    Date for PT Re-Evaluation 01/21/24    Authorization Type [employer] MCR    PT Start Time 1315    PT Stop Time 1345    PT Time Calculation (min) 30 min    Activity Tolerance Patient tolerated treatment well    Behavior During Therapy [facility] for tasks assessed/performed                   Past Medical History:  Diagnosis Date   Arthritis    Cataract 2009   right-Dr. Gennie Kicks   Hyperlipidemia    Hypertension    Post-operative nausea and vomiting    Retinal tear 2008   B- Dr. Larna Plumber   Vitamin D  deficiency    Past Surgical History:  Procedure Laterality Date   APPENDECTOMY  1963   CATARACT EXTRACTION     Dr. Gennie Kicks   POLYPECTOMY     RETINAL TEAR REPAIR CRYOTHERAPY     Dr. Larna Plumber   Patient Active Problem List   Diagnosis Date Noted   Trigger point of left shoulder region 11/22/2023   Cat bite 12/04/2022   Shoulder pain, bilateral 02/07/2022   History of COVID-19 10/10/2021   Head contusion 07/06/2021   Piriformis muscle pain 07/07/2020   Right ankle pain 07/07/2020   Dysuria 08/24/2019   Low back pain 08/24/2019   Laceration of skin of right knee 07/12/2019   Bruit of right carotid artery 07/16/2018   Abdominal pain 01/06/2018   Right knee pain 12/21/2016   Hip flexor tightness 11/30/2015   Piriformis syndrome of left side 04/20/2014   IT band syndrome 04/20/2014   Frequent falls 04/20/2014   Diabetes mellitus type 2 without retinopathy (HCC) 02/19/2013   Well adult exam  07/13/2012   Left lumbar radiculopathy 04/08/2012   Macular hole 02/28/2012   Status post intraocular lens implant 02/28/2012   Vitamin D  deficiency 04/30/2008   Dyslipidemia 10/31/2007   RECENT RETINAL DETACHMENT PARTIAL W/GIANT TEAR 10/31/2007   Essential hypertension 10/31/2007    PCP: Genia Kettering, MD   REFERRING PROVIDER:  Isidro Margo, DO     REFERRING DIAG:  [phone] (ICD-10-CM) - Bilateral shoulder pain, unspecified chronicity      THERAPY DIAG:  Chronic right shoulder pain  Chronic left shoulder pain  Muscle weakness (generalized)  Rationale for Evaluation and Treatment: Rehabilitation  ONSET DATE: 2023 Post COVID-19   SUBJECTIVE:  SUBJECTIVE STATEMENT: Pt reports the shoulders continue to improve. She saw Dr. Felipe Horton and was told she is improving and to return PRN.   Pt reports range is much better. OH and up on shelves is much easier. Turning her head while driving also has improved. Pt reports she "just needs to keep moving" to continue managing the shoulders.    Eval: Pt states she has has shoulder and UE pain with previous NT. It has recently started getting better with upping her vitamin D  dosage. Pt noted this started after getting sick in 2023. She noticed more UE weakness and pain. Pt notices more shoulder discomfort. Her hands are weaker with most difficulty opening a jar. Pt notes tightness into bilat shoulders with anything behind. Pt has been doing some of the MD exercises with some relief. Pins and needles do no go back the elbow. Pt is side sleeper. 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 Pt denies neck position causing pain.   Hand dominance: Right  PERTINENT HISTORY: LBP  PAIN:  Are you having pain? Yes: NPRS scale: 3/10 Pain location: bilat  shoulders and upper arms Pain description: achy  Aggravating factors: reaching OH, lifting, reaching BHB, pushing a wheelchair Relieving factors: rest, heat  PRECAUTIONS: None  RED FLAGS: Cervical red flags: Dysphagia No, Dysmetria No, Diplopia No, Nystagmus No, and Nausea No and Spinal tumors: Yes, pt does report 1-2x/week of waking up in night sweats (advised on red flag answers to malignancy) unintentional weight loss but <10lbs  WEIGHT BEARING RESTRICTIONS: No  FALLS:  Has patient fallen in last 6 months? 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  DIAGNOSTIC FINDINGS:  FINDINGS: Minimal grade 1 anterolisthesis of C5-6 is noted secondary to posterior facet joint hypertrophy. Moderate degenerative disc disease is noted at C3-4, C4-5 and C6-7. No acute fracture is noted.   IMPRESSION: Multilevel degenerative disc disease.  No acute abnormality seen.  FINDINGS: No fracture or malalignment. Moderate AC joint and glenohumeral degenerative change. Left apex is clear   IMPRESSION: Moderate degenerative changes.     Electronically Signed   By: Esmeralda Hedge M.D.   On: 12/10/2023 23:56  PATIENT SURVEYS :  QuickDASH Score: 38.6 / 100 = 38.6 %   5/5  QuickDASH Score: 4.5 / 100 = 4.5 %  COGNITION: Overall cognitive status: Within functional limits for tasks assessed     SENSATION: WFL  POSTURE: Increased kyphosis, fwd head, rounded shoulders   UPPER EXTREMITY ROM: tightness that is "almost pain"  Active ROM Right eval R 5/5 Left eval L 5/5  Shoulder flexion 150 158 150 155  Shoulder extension      Shoulder  abduction 150 165 150 168  Shoulder adduction      Shoulder internal rotation L2 reach T4 L2  reach T5  Shoulder external rotation T2 reach T2 T2 T2  Elbow flexion      Elbow extension      Wrist flexion      Wrist extension      Wrist ulnar deviation      Wrist radial deviation      Wrist pronation      Wrist supination      (  Blank rows = not tested)  UPPER EXTREMITY MMT:  MMT Right eval R 5/5 Left eval L 5/5  Shoulder flexion 4/5 4+/5 4/5 4+/5  Shoulder extension      Shoulder abduction 4 pain 4+/5 4 pain 4+/5  Shoulder adduction      Shoulder internal rotation 4+ 4+ 4+ 4+  Shoulder external rotation 4 4+/ 4 4+  Middle trapezius 4 4+ 4 4+  Lower trapezius      Elbow flexion 4 4+ 4 4+  Elbow extension      Wrist flexion 4+  4+   Wrist extension      Wrist ulnar deviation      Wrist radial deviation      Wrist pronation      Wrist supination      Grip strength (lbs)      (Blank rows = not tested)                                                                                                                              TREATMENT DATE:   5/5 HEP taper, personal training referral, gym based exercise, exam results edu, return to therapy needs, HEP review  4/21   PROM bil shoulders  Neuro: Bil ER with RTB 2x10 (reviewed form) Supine ABC with 1#, x3bil RTB serratus flexion 2x10 Wall clock RTB 2x5ea bil OH taps at wall with 1# cuff weight 2x20ea  Bal rolls at wall 2.2# ball x15ea bil  3/26  Pulley's 1 min ABD and flexion  S/L ER 2x10 2lbs Scaption 2lbs 2x10 Shoulder ABD 2lbs 2x10 Table push up 3x8 YTB horizontal ABD 3x8 Shoulder flexion with horizontal ABD loop  2x10 Wall angel 2x10    PATIENT EDUCATION: Education details: MOI, diagnosis, prognosis, anatomy, exercise progression, DOMS expectations, muscle firing,  envelope of function, HEP, POC  Person educated: Patient Education method: Explanation, Demonstration, Tactile cues, Verbal cues, and  Handouts Education comprehension: verbalized understanding, returned demonstration, verbal cues required, and tactile cues required  HOME EXERCISE PROGRAM:  Access Code: [email or web address] URL: [email or web address] Date: 10/23/2023 Prepared by: Silver Dross  ASSESSMENT:  CLINICAL IMPRESSION:  Pt has met all PT goals at this time and has improved greatly with objective and subjective outcome measures as demonstrated above. Pt is able to return to her normal ADL with less pain and discomfort. Pt does continue to have caregiver duties which are difficult but she understands strategies for activity modification. HEP reviewed. Plan to refer to personal training and have pt continue with exercise as her schedule allows with caring for her aging family member. D/C this episode at this time.     OBJECTIVE IMPAIRMENTS: decreased ROM, decreased strength, hypomobility, impaired flexibility, impaired UE functional use, improper body mechanics, postural dysfunction, and pain.   ACTIVITY LIMITATIONS: carrying, lifting, bathing, dressing, and reach over head  PARTICIPATION LIMITATIONS: cleaning, driving, shopping, community activity, and yard work  PERSONAL FACTORS: Age, Fitness, Time since onset of injury/illness/exacerbation,  and 1-2 comorbidities:    are also affecting patient's functional outcome.   REHAB POTENTIAL: Good  CLINICAL DECISION MAKING: Stable/uncomplicated  EVALUATION COMPLEXITY: Low  GOALS:   SHORT TERM GOALS: Target date: 12/04/2023   Pt will become independent with HEP in order to demonstrate synthesis of PT education.  Baseline: Goal status: MET  2.  Pt will decrease quick DASH score by at least 8% in order to demonstrate clinically significant reduction in disability.   Baseline:  Goal status: MET  3.  Pt will be able to demonstrate BHB reach without pain in order to demonstrate functional improvement in UE function for self-care and house hold  duties.  Baseline:  Goal status: MET  LONG TERM GOALS: Target date: 01/15/2024   Pt  will become independent with final HEP in order to demonstrate synthesis of PT education.  Baseline:  Goal status: MET  2.  Pt will be able to reach [facility] and carry/hold >10 lbs in order to demonstrate functional improvement in UE strength for return to house projects and exercise.  Baseline:  Goal status: MET  3.  Pt will decrease quick DASH score by at least 16% in order to demonstrate clinically significant reduction in disability.   Baseline:  Goal status: MET  4.  Pt will be able to demonstrate/report ability to sit/stand/sleep for extended periods of time without pain in order to demonstrate functional improvement and tolerance to static positioning.  Baseline:  Goal status: MET   PLAN: PT FREQUENCY: 1-2x/week  PT DURATION: 12 weeks  PLANNED INTERVENTIONS: 97164- PT Re-evaluation, 97110-Therapeutic exercises, 97530- Therapeutic activity, 97112- Neuromuscular re-education, 97535- Self Care, 16109- Manual therapy, [id number]- Aquatic Therapy, 97760- Splinting, 97014- Electrical stimulation (unattended), [id number]- Electrical stimulation (manual), [id number]- Vasopneumatic device, [id number]- Ultrasound, [id number]- Traction (mechanical), [id number]- Ionotophoresis 4mg /ml Dexamethasone, Taping, Dry Needling, Joint mobilization, Joint manipulation, Spinal manipulation, Spinal mobilization, Scar mobilization, DME instructions, Cryotherapy, and Moist heat   Silver Dross, PT 01/20/2024, 1:56 PM

## 2024-02-03 ENCOUNTER — Encounter (HOSPITAL_BASED_OUTPATIENT_CLINIC_OR_DEPARTMENT_OTHER): Admitting: Physical Therapy

## 2024-02-13 ENCOUNTER — Other Ambulatory Visit: Payer: Self-pay | Admitting: Internal Medicine

## 2024-02-13 DIAGNOSIS — Z1231 Encounter for screening mammogram for malignant neoplasm of breast: Secondary | ICD-10-CM

## 2024-02-17 ENCOUNTER — Encounter (HOSPITAL_BASED_OUTPATIENT_CLINIC_OR_DEPARTMENT_OTHER): Admitting: Physical Therapy

## 2024-02-24 ENCOUNTER — Other Ambulatory Visit (INDEPENDENT_AMBULATORY_CARE_PROVIDER_SITE_OTHER)

## 2024-02-24 DIAGNOSIS — R739 Hyperglycemia, unspecified: Secondary | ICD-10-CM

## 2024-02-24 DIAGNOSIS — I10 Essential (primary) hypertension: Secondary | ICD-10-CM | POA: Diagnosis not present

## 2024-02-24 DIAGNOSIS — E785 Hyperlipidemia, unspecified: Secondary | ICD-10-CM | POA: Diagnosis not present

## 2024-02-24 DIAGNOSIS — M25511 Pain in right shoulder: Secondary | ICD-10-CM

## 2024-02-24 DIAGNOSIS — M25512 Pain in left shoulder: Secondary | ICD-10-CM | POA: Diagnosis not present

## 2024-02-24 LAB — LIPID PANEL
Cholesterol: 234 mg/dL — ABNORMAL HIGH (ref 0–200)
HDL: 57.8 mg/dL (ref >39.00)
LDL Cholesterol: 155 mg/dL — ABNORMAL HIGH (ref 0–99)
NonHDL: 176.64
Total CHOL/HDL Ratio: 4
Triglycerides: 107 mg/dL (ref 0.0–149.0)
VLDL: 21.4 mg/dL (ref 0.0–40.0)

## 2024-02-24 LAB — COMPREHENSIVE METABOLIC PANEL WITH GFR
ALT: 17 U/L (ref 0–35)
AST: 14 U/L (ref 0–37)
Albumin: 4.2 g/dL (ref 3.5–5.2)
Alkaline Phosphatase: 56 U/L (ref 39–117)
BUN: 12 mg/dL (ref 6–23)
CO2: 23 meq/L (ref 19–32)
Calcium: 9.1 mg/dL (ref 8.4–10.5)
Chloride: 100 meq/L (ref 96–112)
Creatinine, Ser: 0.58 mg/dL (ref 0.40–1.20)
GFR: 86.89 mL/min (ref >60.00)
Glucose, Bld: 100 mg/dL — ABNORMAL HIGH (ref 70–99)
Potassium: 3.8 meq/L (ref 3.5–5.1)
Sodium: 134 meq/L — ABNORMAL LOW (ref 135–145)
Total Bilirubin: 0.7 mg/dL (ref 0.2–1.2)
Total Protein: 6.9 g/dL (ref 6.0–8.3)

## 2024-02-24 LAB — SEDIMENTATION RATE: Sed Rate: 21 mm/h (ref 0–30)

## 2024-02-24 LAB — CK: Total CK: 132 U/L (ref 7–177)

## 2024-02-24 LAB — HEMOGLOBIN A1C: Hgb A1c MFr Bld: 6 % (ref 4.6–6.5)

## 2024-02-25 ENCOUNTER — Ambulatory Visit (INDEPENDENT_AMBULATORY_CARE_PROVIDER_SITE_OTHER): Payer: Medicare PPO | Admitting: Internal Medicine

## 2024-02-25 ENCOUNTER — Encounter: Payer: Self-pay | Admitting: Internal Medicine

## 2024-02-25 VITALS — BP 110/64 | HR 66 | Temp 98.2°F | Ht 59.0 in | Wt 121.0 lb

## 2024-02-25 DIAGNOSIS — E785 Hyperlipidemia, unspecified: Secondary | ICD-10-CM

## 2024-02-25 DIAGNOSIS — I10 Essential (primary) hypertension: Secondary | ICD-10-CM

## 2024-02-25 DIAGNOSIS — E559 Vitamin D deficiency, unspecified: Secondary | ICD-10-CM | POA: Diagnosis not present

## 2024-02-25 DIAGNOSIS — M545 Low back pain, unspecified: Secondary | ICD-10-CM | POA: Diagnosis not present

## 2024-02-25 DIAGNOSIS — Z7984 Long term (current) use of oral hypoglycemic drugs: Secondary | ICD-10-CM | POA: Diagnosis not present

## 2024-02-25 DIAGNOSIS — E119 Type 2 diabetes mellitus without complications: Secondary | ICD-10-CM

## 2024-02-25 MED ORDER — MELOXICAM 7.5 MG PO TABS: 7.5000 mg | ORAL_TABLET | Freq: Every day | ORAL | 1 refills | Status: DC | PRN

## 2024-02-25 MED ORDER — MELOXICAM 7.5 MG PO TABS: 7.5000 mg | ORAL_TABLET | Freq: Every day | ORAL | 1 refills | Status: AC | PRN

## 2024-02-25 NOTE — Assessment & Plan Note (Signed)
Cont on Vascepa 

## 2024-02-25 NOTE — Progress Notes (Signed)
 Subjective:  Patient ID: Lisa [name], female    DOB: 1945-12-23  Age: 78 y.o. MRN: [id number]  CC: Annual Exam (Pt wants to discuss possibly starting on Meloxicam ) and Abdominal Pain   HPI Shakeita Vandevander Davitt presents for a well exam F/u HTN, DM, dyslipidemia  Outpatient Medications Prior to Visit  Medication Sig Dispense Refill   ascorbic acid (VITAMIN C) 500 MG tablet Take by mouth.     cholecalciferol (VITAMIN D ) 1000 UNITS tablet Take 3,000 Units by mouth every 3 (three) days.     cyanocobalamin  1000 MCG tablet Take 1,000 mcg by mouth daily.      gabapentin  (NEURONTIN ) 100 MG capsule Take 1 capsule (100 mg total) by mouth at bedtime. 90 capsule 0   Glucosamine HCl 500 MG TABS Take by mouth 2 (two) times daily.      lisinopril -hydrochlorothiazide  (ZESTORETIC ) 10-12.5 MG tablet TAKE 1 TABLET BY MOUTH EVERY DAY 90 tablet 3   metFORMIN  (GLUCOPHAGE ) 500 MG tablet TAKE 1 TABLET BY MOUTH EVERY DAY 90 tablet 2   SODIUM FLUORIDE 5000 PPM 1.1 % PSTE Take by mouth.     Turmeric 500 MG TABS Take 2 capsules by mouth 2 (two) times daily.     VASCEPA  1 g capsule TAKE 2 CAPSULES BY MOUTH TWICE A DAY 360 capsule 3   meloxicam  (MOBIC ) 7.5 MG tablet Take 1 tablet (7.5 mg total) by mouth daily as needed for pain. 90 tablet 0   No facility-administered medications prior to visit.    ROS: Review of Systems  Constitutional:  Negative for activity change, appetite change, chills, fatigue and unexpected weight change.  HENT:  Negative for congestion, mouth sores and sinus pressure.   Eyes:  Negative for visual disturbance.  Respiratory:  Negative for cough and chest tightness.   Gastrointestinal:  Negative for abdominal pain and nausea.  Genitourinary:  Negative for difficulty urinating, frequency and vaginal pain.  Musculoskeletal:  Negative for back pain and gait problem.  Skin:  Negative for pallor and rash.  Neurological:  Negative for dizziness, tremors, weakness, numbness and headaches.   Psychiatric/Behavioral:  Negative for confusion and sleep disturbance.     Objective:  BP 110/64   Pulse 66   Temp 98.2 F (36.8 C) (Oral)   Ht 4\' 11"  (1.499 m)   Wt 121 lb (54.9 kg)   SpO2 99%   BMI 24.44 kg/m   BP Readings from Last 3 Encounters:  02/25/24 110/64  01/17/24 120/70  11/22/23 110/74    Wt Readings from Last 3 Encounters:  02/25/24 121 lb (54.9 kg)  01/17/24 123 lb 3.2 oz (55.9 kg)  11/22/23 122 lb (55.3 kg)    Physical Exam Constitutional:      General: She is not in acute distress.    Appearance: She is well-developed.  HENT:     Head: Normocephalic.     Right Ear: External ear normal.     Left Ear: External ear normal.     Nose: Nose normal.  Eyes:     General:        Right eye: No discharge.        Left eye: No discharge.     Conjunctiva/sclera: Conjunctivae normal.     Pupils: Pupils are equal, round, and reactive to light.  Neck:     Thyroid : No thyromegaly.     Vascular: No JVD.     Trachea: No tracheal deviation.  Cardiovascular:     Rate and Rhythm: Normal rate  and regular rhythm.     Heart sounds: Normal heart sounds.  Pulmonary:     Effort: No respiratory distress.     Breath sounds: No stridor. No wheezing.  Abdominal:     General: Bowel sounds are normal. There is no distension.     Palpations: Abdomen is soft. There is no mass.     Tenderness: There is no abdominal tenderness. There is no guarding or rebound.  Musculoskeletal:        General: No tenderness.     Cervical back: Normal range of motion and neck supple. No rigidity.  Lymphadenopathy:     Cervical: No cervical adenopathy.  Skin:    Findings: No erythema or rash.  Neurological:     Cranial Nerves: No cranial nerve deficit.     Motor: No abnormal muscle tone.     Coordination: Coordination normal.     Deep Tendon Reflexes: Reflexes normal.  Psychiatric:        Behavior: Behavior normal.        Thought Content: Thought content normal.        Judgment:  Judgment normal.     Lab Results  Component Value Date   WBC 9.0 10/11/2023   HGB 15.2 (H) 10/11/2023   HCT 45.2 10/11/2023   PLT 341.0 10/11/2023   GLUCOSE 100 (H) 02/24/2024   CHOL 234 (H) 02/24/2024   TRIG 107.0 02/24/2024   HDL 57.80 02/24/2024   LDLDIRECT 154.0 10/09/2021   LDLCALC 155 (H) 02/24/2024   ALT 17 02/24/2024   AST 14 02/24/2024   NA 134 (L) 02/24/2024   K 3.8 02/24/2024   CL 100 02/24/2024   CREATININE 0.58 02/24/2024   BUN 12 02/24/2024   CO2 23 02/24/2024   TSH 0.66 10/11/2023   HGBA1C 6.0 02/24/2024    MM 3D SCREENING MAMMOGRAM BILATERAL BREAST Result Date: 03/18/2023 CLINICAL DATA:  Screening. EXAM: DIGITAL SCREENING BILATERAL MAMMOGRAM WITH TOMOSYNTHESIS AND CAD TECHNIQUE: Bilateral screening digital craniocaudal and mediolateral oblique mammograms were obtained. Bilateral screening digital breast tomosynthesis was performed. The images were evaluated with computer-aided detection. COMPARISON:  Previous exam(s). ACR Breast Density Category b: There are scattered areas of fibroglandular density. FINDINGS: There are no findings suspicious for malignancy. IMPRESSION: No mammographic evidence of malignancy. A result letter of this screening mammogram will be mailed directly to the patient. RECOMMENDATION: Screening mammogram in one year. (Code:SM-B-01Y) BI-RADS CATEGORY  1: Negative. Electronically Signed   By: Amanda Jungling M.D.   On: 03/18/2023 13:22    Assessment & Plan:   Problem List Items Addressed This Visit     Vitamin D  deficiency   On Vit D      Dyslipidemia    Cont on  Vascepa       Essential hypertension   Cont on Prinizide CT coronary calcium score 0 Re-check BP at home RTC 6 wks if BP is up      Diabetes mellitus type 2 without retinopathy (HCC) - Primary   Monitor A1c On diet and Metformin       Low back pain   Meloxicam  prn  Potential benefits of a long term NSAID use as well as potential risks  and complications were explained  to the patient and were aknowledged.        Relevant Medications   meloxicam  (MOBIC ) 7.5 MG tablet      Meds ordered this encounter  Medications   DISCONTD: meloxicam  (MOBIC ) 7.5 MG tablet    Sig: Take 1 tablet (7.5 mg  total) by mouth daily as needed for pain.    Dispense:  90 tablet    Refill:  1   meloxicam  (MOBIC ) 7.5 MG tablet    Sig: Take 1 tablet (7.5 mg total) by mouth daily as needed for pain.    Dispense:  90 tablet    Refill:  1      Follow-up: Return in about 3 months (around 05/27/2024) for a follow-up visit.  Anitra Barn, MD

## 2024-02-25 NOTE — Assessment & Plan Note (Signed)
 Monitor A1c On diet and Metformin

## 2024-02-25 NOTE — Assessment & Plan Note (Signed)
 Cont on Prinizide CT coronary calcium score 0 Re-check BP at home RTC 6 wks if BP is up

## 2024-02-25 NOTE — Assessment & Plan Note (Signed)
 On Vit D

## 2024-02-25 NOTE — Assessment & Plan Note (Signed)
 Meloxicam prn  Potential benefits of a long term NSAID use as well as potential risks  and complications were explained to the patient and were aknowledged.

## 2024-03-24 ENCOUNTER — Ambulatory Visit
Admission: RE | Admit: 2024-03-24 | Discharge: 2024-03-24 | Disposition: A | Discharge status: Home / Self Care | Source: Ambulatory Visit | Attending: Internal Medicine | Admitting: Internal Medicine

## 2024-03-24 DIAGNOSIS — Z1231 Encounter for screening mammogram for malignant neoplasm of breast: Secondary | ICD-10-CM | POA: Diagnosis not present

## 2024-03-28 ENCOUNTER — Other Ambulatory Visit: Payer: Self-pay

## 2024-03-28 ENCOUNTER — Encounter (HOSPITAL_COMMUNITY): Payer: Self-pay | Admitting: *Deleted

## 2024-03-28 ENCOUNTER — Emergency Department (HOSPITAL_COMMUNITY)
Admission: EM | Admit: 2024-03-28 | Discharge: 2024-03-28 | Disposition: A | Discharge status: Home / Self Care | Attending: Emergency Medicine | Admitting: Emergency Medicine

## 2024-03-28 DIAGNOSIS — T63441A Toxic effect of venom of bees, accidental (unintentional), initial encounter: Secondary | ICD-10-CM | POA: Insufficient documentation

## 2024-03-28 DIAGNOSIS — I10 Essential (primary) hypertension: Secondary | ICD-10-CM | POA: Insufficient documentation

## 2024-03-28 MED ORDER — FAMOTIDINE 20 MG PO TABS
20.0000 mg | ORAL_TABLET | Freq: Once | ORAL | Status: AC
  Administered 2024-03-28: 20 mg via ORAL
  Filled 2024-03-28: qty 1

## 2024-03-28 MED ORDER — PREDNISONE 20 MG PO TABS
60.0000 mg | ORAL_TABLET | Freq: Once | ORAL | Status: AC
  Administered 2024-03-28: 60 mg via ORAL
  Filled 2024-03-28: qty 3

## 2024-03-28 MED ORDER — PREDNISONE 20 MG PO TABS: 40.0000 mg | ORAL_TABLET | Freq: Every day | ORAL | 0 refills | Status: AC

## 2024-03-28 MED ORDER — DIPHENHYDRAMINE HCL 25 MG PO CAPS
25.0000 mg | ORAL_CAPSULE | Freq: Once | ORAL | Status: AC
  Administered 2024-03-28: 25 mg via ORAL
  Filled 2024-03-28: qty 1

## 2024-03-28 NOTE — ED Triage Notes (Signed)
 Patient states she was stung earlier in the on the right index finger , this am was putting something in her recycle bin and was stung multiple times by wasp on the right hand. 0830 am today right hand is swollen C/o lips and tongue tingling no problems breathing

## 2024-03-28 NOTE — ED Provider Notes (Signed)
 [place] EMERGENCY DEPARTMENT AT [facility] Provider Note   CSN: [id number] Arrival date & time: 03/28/24  9064     Patient presents with: Insect Bite   Fidencia Mccloud Schwanz is a 78 y.o. female.   Patient is a 78 year old female with a history of hypertension, hyperlipidemia, prior anaphylaxis to bee sting that required an EpiPen years ago who is presenting today after being stung repeatedly by bees in the right hand.  She reports she was stung once yesterday and did fine but today when she went to throw something away in the trash can she open the lid and was stung at least 4 or 5 times in her right hand.  It was painful and swollen but then she started feeling some tingling in her tongue.  She reports her tongue still does not feel 100% normal but denies any shortness of breath or trouble swallowing.  She denies any chest pain or nausea.  She has not taken anything before for arrival.  The history is provided by the patient.       Prior to Admission medications   Medication Sig Start Date End Date Taking? Authorizing Provider  predniSONE  (DELTASONE ) 20 MG tablet Take 2 tablets (40 mg total) by mouth daily. 03/28/24  Yes Havanna Groner, Benton, MD  ascorbic acid (VITAMIN C) 500 MG tablet Take by mouth.    [provider]  cholecalciferol (VITAMIN D ) 1000 UNITS tablet Take 3,000 Units by mouth every 3 (three) days.    [provider]  cyanocobalamin  1000 MCG tablet Take 1,000 mcg by mouth daily.     [provider]  gabapentin  (NEURONTIN ) 100 MG capsule Take 1 capsule (100 mg total) by mouth at bedtime. 10/11/23   Smith, Zachary M, DO  Glucosamine HCl 500 MG TABS Take by mouth 2 (two) times daily.     [provider]  lisinopril -hydrochlorothiazide  (ZESTORETIC ) 10-12.5 MG tablet TAKE 1 TABLET BY MOUTH EVERY DAY 12/28/23   Webb, Padonda B, FNP  meloxicam  (MOBIC ) 7.5 MG tablet Take 1 tablet (7.5 mg total) by mouth daily as needed for pain. 02/25/24    [name], Karlynn GAILS, MD  metFORMIN  (GLUCOPHAGE ) 500 MG tablet TAKE 1 TABLET BY MOUTH EVERY DAY 09/29/23   [name], Aleksei V, MD  SODIUM FLUORIDE 5000 PPM 1.1 % PSTE Take by mouth. 03/08/22   [provider]  Turmeric 500 MG TABS Take 2 capsules by mouth 2 (two) times daily.    [provider]  VASCEPA  1 g capsule TAKE 2 CAPSULES BY MOUTH TWICE A DAY 11/18/23   [name], Aleksei V, MD    Allergies: Atorvastatin, Ezetimibe-simvastatin, Fish oil, Livalo [pitavastatin calcium], Lovastatin, Rosuvastatin, and Zetia [ezetimibe]    Review of Systems  Updated Vital Signs BP (!) 165/86   Pulse (!) 57   Temp 97.8 F (36.6 C) (Oral)   Resp 15   Ht 4' 11.75 (1.518 m)   Wt 55.3 kg   SpO2 100%   BMI 24.03 kg/m   Physical Exam Vitals and nursing note reviewed.  Constitutional:      General: She is not in acute distress.    Appearance: She is well-developed.  HENT:     Head: Normocephalic and atraumatic.     Mouth/Throat:     Mouth: Mucous membranes are dry.     Comments: No notable tongue swelling.  Uvula and posterior pharynx are normal.  No stridor Eyes:     Pupils: Pupils are equal, round, and reactive to light.  Cardiovascular:     Rate and Rhythm: Normal rate and regular rhythm.     Heart sounds: Normal heart sounds. No murmur heard.    No friction rub.  Pulmonary:     Effort: Pulmonary effort is normal.     Breath sounds: Normal breath sounds. No wheezing or rales.  Abdominal:     General: Bowel sounds are normal. There is no distension.     Palpations: Abdomen is soft.     Tenderness: There is no abdominal tenderness. There is no guarding or rebound.  Musculoskeletal:        General: Tenderness present. Normal range of motion.     Comments: No edema.  Swelling of the right hand with mild warmth and erythema of all 4 fingers.  No findings on the thumb  Skin:    General: Skin is warm and dry.     Findings: No rash.  Neurological:     Mental Status: She is  alert and oriented to person, place, and time.     Cranial Nerves: No cranial nerve deficit.  Psychiatric:        Behavior: Behavior normal.     (all labs ordered are listed, but only abnormal results are displayed) Labs Reviewed - No data to display  EKG: None  Radiology: No results found.   Procedures   Medications Ordered in the ED  diphenhydrAMINE  (BENADRYL ) capsule 25 mg (25 mg Oral Given 03/28/24 1033)  predniSONE  (DELTASONE ) tablet 60 mg (60 mg Oral Given 03/28/24 1033)  famotidine  (PEPCID ) tablet 20 mg (20 mg Oral Given 03/28/24 1034)                                    Medical Decision Making Risk Prescription drug management.   Pt with multiple medical problems and comorbidities and presenting today with a complaint that caries a high risk for morbidity and mortality.  Here today with concern for an allergic reaction to multiple stings on her right hand.  Patient is not displaying symptoms consistent with anaphylaxis at this time.  She does have swelling of the hand and appears to have more of a localized reaction to the bee sting but is complaining some mild tingling in her lips.  She is in no respiratory distress and no obvious swelling.  She was given oral Benadryl , prednisone  and Pepcid .  Will continue to monitor.  1:02 PM On recheck patient reports she is feeling better.  Hand remains mildly swollen but she is having no other systemic symptoms.  At this time feel that she is stable for discharge home.  Given short course of prednisone .      Final diagnoses:  Bee sting reaction, accidental or unintentional, initial encounter    ED Discharge Orders          Ordered    predniSONE  (DELTASONE ) 20 MG tablet  Daily        03/28/24 1237               Doretha Folks, MD 03/28/24 1302

## 2024-03-28 NOTE — Discharge Instructions (Addendum)
 Take the next dose of prednisone  tomorrow.  Avoid any further stings.  Use the Benadryl  as needed for itching

## 2024-06-01 ENCOUNTER — Other Ambulatory Visit (INDEPENDENT_AMBULATORY_CARE_PROVIDER_SITE_OTHER)

## 2024-06-01 DIAGNOSIS — E119 Type 2 diabetes mellitus without complications: Secondary | ICD-10-CM | POA: Diagnosis not present

## 2024-06-01 DIAGNOSIS — E559 Vitamin D deficiency, unspecified: Secondary | ICD-10-CM

## 2024-06-01 DIAGNOSIS — E785 Hyperlipidemia, unspecified: Secondary | ICD-10-CM

## 2024-06-01 LAB — CBC WITH DIFFERENTIAL/PLATELET
Basophils Absolute: 0 K/uL (ref 0.0–0.1)
Basophils Relative: 0.8 % (ref 0.0–3.0)
Eosinophils Absolute: 0.1 K/uL (ref 0.0–0.7)
Eosinophils Relative: 0.8 % (ref 0.0–5.0)
HCT: 42.7 % (ref 36.0–46.0)
Hemoglobin: 14.1 g/dL (ref 12.0–15.0)
Lymphocytes Relative: 24.8 % (ref 12.0–46.0)
Lymphs Abs: 1.5 K/uL (ref 0.7–4.0)
MCHC: 33 g/dL (ref 30.0–36.0)
MCV: 86.8 fl (ref 78.0–100.0)
Monocytes Absolute: 0.5 K/uL (ref 0.1–1.0)
Monocytes Relative: 8.2 % (ref 3.0–12.0)
Neutro Abs: 4 K/uL (ref 1.4–7.7)
Neutrophils Relative %: 65.4 % (ref 43.0–77.0)
Platelets: 305 K/uL (ref 150.0–400.0)
RBC: 4.92 Mil/uL (ref 3.87–5.11)
RDW: 12.5 % (ref 11.5–15.5)
WBC: 6.1 K/uL (ref 4.0–10.5)

## 2024-06-01 LAB — URINALYSIS, ROUTINE W REFLEX MICROSCOPIC
Bilirubin Urine: NEGATIVE
Hgb urine dipstick: NEGATIVE
Ketones, ur: NEGATIVE
Nitrite: NEGATIVE
Specific Gravity, Urine: 1.01 (ref 1.000–1.030)
Total Protein, Urine: NEGATIVE
Urine Glucose: NEGATIVE
Urobilinogen, UA: 0.2 (ref 0.0–1.0)
pH: 7.5 (ref 5.0–8.0)

## 2024-06-01 LAB — COMPREHENSIVE METABOLIC PANEL WITH GFR
ALT: 19 U/L (ref 0–35)
AST: 16 U/L (ref 0–37)
Albumin: 4.2 g/dL (ref 3.5–5.2)
Alkaline Phosphatase: 65 U/L (ref 39–117)
BUN: 15 mg/dL (ref 6–23)
CO2: 26 meq/L (ref 19–32)
Calcium: 9.5 mg/dL (ref 8.4–10.5)
Chloride: 101 meq/L (ref 96–112)
Creatinine, Ser: 0.59 mg/dL (ref 0.40–1.20)
GFR: 86.37 mL/min (ref >60.00)
Glucose, Bld: 102 mg/dL — ABNORMAL HIGH (ref 70–99)
Potassium: 3.8 meq/L (ref 3.5–5.1)
Sodium: 136 meq/L (ref 135–145)
Total Bilirubin: 0.4 mg/dL (ref 0.2–1.2)
Total Protein: 7 g/dL (ref 6.0–8.3)

## 2024-06-01 LAB — LIPID PANEL
Cholesterol: 212 mg/dL — ABNORMAL HIGH (ref 0–200)
HDL: 54 mg/dL (ref >39.00)
LDL Cholesterol: 135 mg/dL — ABNORMAL HIGH (ref 0–99)
NonHDL: 158.1
Total CHOL/HDL Ratio: 4
Triglycerides: 118 mg/dL (ref 0.0–149.0)
VLDL: 23.6 mg/dL (ref 0.0–40.0)

## 2024-06-01 LAB — HEMOGLOBIN A1C: Hgb A1c MFr Bld: 6.2 % (ref 4.6–6.5)

## 2024-06-01 LAB — TSH: TSH: 0.63 u[IU]/mL (ref 0.35–5.50)

## 2024-06-02 ENCOUNTER — Ambulatory Visit: Admitting: Internal Medicine

## 2024-06-02 ENCOUNTER — Encounter: Payer: Self-pay | Admitting: Internal Medicine

## 2024-06-02 VITALS — BP 120/62 | HR 72 | Temp 98.4°F | Ht 59.75 in | Wt 122.0 lb

## 2024-06-02 DIAGNOSIS — Z1881 Retained glass fragments: Secondary | ICD-10-CM | POA: Diagnosis not present

## 2024-06-02 DIAGNOSIS — M545 Low back pain, unspecified: Secondary | ICD-10-CM

## 2024-06-02 DIAGNOSIS — E119 Type 2 diabetes mellitus without complications: Secondary | ICD-10-CM

## 2024-06-02 DIAGNOSIS — I10 Essential (primary) hypertension: Secondary | ICD-10-CM | POA: Diagnosis not present

## 2024-06-02 DIAGNOSIS — E785 Hyperlipidemia, unspecified: Secondary | ICD-10-CM

## 2024-06-02 DIAGNOSIS — S60929A Unspecified superficial injury of unspecified hand, initial encounter: Secondary | ICD-10-CM | POA: Diagnosis not present

## 2024-06-02 DIAGNOSIS — T148XXA Other injury of unspecified body region, initial encounter: Secondary | ICD-10-CM | POA: Insufficient documentation

## 2024-06-02 DIAGNOSIS — E559 Vitamin D deficiency, unspecified: Secondary | ICD-10-CM

## 2024-06-02 NOTE — Assessment & Plan Note (Addendum)
 Cont on Prinizide CT coronary calcium score 0 Re-check BP at home

## 2024-06-02 NOTE — Assessment & Plan Note (Signed)
 Monitor A1c On diet and Metformin

## 2024-06-02 NOTE — Assessment & Plan Note (Signed)
Cont on Vascepa 

## 2024-06-02 NOTE — Assessment & Plan Note (Signed)
 Meloxicam prn  Potential benefits of a long term NSAID use as well as potential risks  and complications were explained to the patient and were aknowledged.

## 2024-06-02 NOTE — Assessment & Plan Note (Signed)
 On Vit D

## 2024-06-02 NOTE — Progress Notes (Signed)
 Subjective:  Patient ID: Lisa [name], female    DOB: [birth year]  Age: 78 y.o. MRN: [id number]  CC: Follow-up (Patient states concerns of her A1c. States diet is about the same and going to gym, states only thing that has changed is she is now on meloxicam . )   HPI Lisa [name] presents for elevated glucose,  HTN, dyslipidemia Pt fell on the patio and wounded her palm w/a piece of glass  Outpatient Medications Prior to Visit  Medication Sig Dispense Refill   ascorbic acid (VITAMIN C) 500 MG tablet Take by mouth.     cholecalciferol (VITAMIN D ) 1000 UNITS tablet Take 3,000 Units by mouth every 3 (three) days.     cyanocobalamin  1000 MCG tablet Take 1,000 mcg by mouth daily.      Glucosamine HCl 500 MG TABS Take by mouth 2 (two) times daily.      lisinopril -hydrochlorothiazide  (ZESTORETIC ) 10-12.5 MG tablet TAKE 1 TABLET BY MOUTH EVERY DAY 90 tablet 3   meloxicam  (MOBIC ) 7.5 MG tablet Take 1 tablet (7.5 mg total) by mouth daily as needed for pain. 90 tablet 1   metFORMIN  (GLUCOPHAGE ) 500 MG tablet TAKE 1 TABLET BY MOUTH EVERY DAY 90 tablet 2   VASCEPA  1 g capsule TAKE 2 CAPSULES BY MOUTH TWICE A DAY 360 capsule 3   gabapentin  (NEURONTIN ) 100 MG capsule Take 1 capsule (100 mg total) by mouth at bedtime. (Patient not taking: Reported on 06/02/2024) 90 capsule 0   predniSONE  (DELTASONE ) 20 MG tablet Take 2 tablets (40 mg total) by mouth daily. (Patient not taking: Reported on 06/02/2024) 6 tablet 0   SODIUM FLUORIDE 5000 PPM 1.1 % PSTE Take by mouth. (Patient not taking: Reported on 06/02/2024)     Turmeric 500 MG TABS Take 2 capsules by mouth 2 (two) times daily. (Patient not taking: Reported on 06/02/2024)     No facility-administered medications prior to visit.    ROS: Review of Systems  Constitutional:  Negative for activity change, appetite change, chills, fatigue and unexpected weight change.  HENT:  Negative for congestion, mouth sores and sinus pressure.   Eyes:  Negative for  visual disturbance.  Respiratory:  Negative for cough and chest tightness.   Gastrointestinal:  Negative for abdominal pain and nausea.  Genitourinary:  Negative for difficulty urinating, frequency and vaginal pain.  Musculoskeletal:  Negative for back pain and gait problem.  Skin:  Negative for pallor and rash.  Neurological:  Negative for dizziness, tremors, weakness, numbness and headaches.  Psychiatric/Behavioral:  Negative for confusion and sleep disturbance.     Objective:  BP 120/62   Pulse 72   Temp 98.4 F (36.9 C) (Oral)   Ht 4' 11.75 (1.518 m)   Wt 122 lb (55.3 kg)   SpO2 99%   BMI 24.03 kg/m   BP Readings from Last 3 Encounters:  06/02/24 120/62  03/28/24 (!) 165/86  02/25/24 110/64    Wt Readings from Last 3 Encounters:  06/02/24 122 lb (55.3 kg)  03/28/24 122 lb (55.3 kg)  02/25/24 121 lb (54.9 kg)    Physical Exam Constitutional:      General: She is not in acute distress.    Appearance: Normal appearance. She is well-developed.  HENT:     Head: Normocephalic.     Right Ear: External ear normal.     Left Ear: External ear normal.     Nose: Nose normal.  Eyes:     General:  Right eye: No discharge.        Left eye: No discharge.     Conjunctiva/sclera: Conjunctivae normal.     Pupils: Pupils are equal, round, and reactive to light.  Neck:     Thyroid : No thyromegaly.     Vascular: No JVD.     Trachea: No tracheal deviation.  Cardiovascular:     Rate and Rhythm: Normal rate and regular rhythm.     Heart sounds: Normal heart sounds.  Pulmonary:     Effort: No respiratory distress.     Breath sounds: No stridor. No wheezing.  Abdominal:     General: Bowel sounds are normal. There is no distension.     Palpations: Abdomen is soft. There is no mass.     Tenderness: There is no abdominal tenderness. There is no guarding or rebound.  Musculoskeletal:        General: No tenderness.     Cervical back: Normal range of motion and neck supple.  No rigidity.  Lymphadenopathy:     Cervical: No cervical adenopathy.  Skin:    Findings: No erythema or rash.  Neurological:     Mental Status: She is oriented to person, place, and time.     Cranial Nerves: No cranial nerve deficit.     Motor: No abnormal muscle tone.     Coordination: Coordination normal.     Deep Tendon Reflexes: Reflexes normal.  Psychiatric:        Behavior: Behavior normal.        Thought Content: Thought content normal.        Judgment: Judgment normal.   R palm callus over wound  Lab Results  Component Value Date   WBC 6.1 06/01/2024   HGB 14.1 06/01/2024   HCT 42.7 06/01/2024   PLT 305.0 06/01/2024   GLUCOSE 102 (H) 06/01/2024   CHOL 212 (H) 06/01/2024   TRIG 118.0 06/01/2024   HDL 54.00 06/01/2024   LDLDIRECT 154.0 10/09/2021   LDLCALC 135 (H) 06/01/2024   ALT 19 06/01/2024   AST 16 06/01/2024   NA 136 06/01/2024   K 3.8 06/01/2024   CL 101 06/01/2024   CREATININE 0.59 06/01/2024   BUN 15 06/01/2024   CO2 26 06/01/2024   TSH 0.63 06/01/2024   HGBA1C 6.2 06/01/2024    No results found.  Assessment & Plan:   Problem List Items Addressed This Visit     Diabetes mellitus type 2 without retinopathy (HCC) - Primary   Monitor A1c On diet and Metformin       Relevant Orders   Comprehensive metabolic panel with GFR   Hemoglobin A1c   Dyslipidemia    Cont on  Vascepa       Relevant Orders   Lipid panel   Superficial glass foreign body   Epsom salt paste If not better - hand surgery ref      Vitamin D  deficiency   On Vit D         No orders of the defined types were placed in this encounter.     Follow-up: Return in about 3 months (around 09/01/2024) for a follow-up visit.  Marolyn Noel, MD

## 2024-06-02 NOTE — Assessment & Plan Note (Addendum)
 R palm callus over wound Epsom salt paste If not better - hand surgery ref

## 2024-06-02 NOTE — Patient Instructions (Signed)
 To make a basic Epsom salt paste (or poultice), mix 1 to 2 tablespoons of Epsom salt with just enough warm water to form a thick paste. For a paste designed for skin irritation or hemorrhoids, you can instead mix 2 tablespoons of Epsom salt with 2 tablespoons of vegetable glycerin to form a paste. Apply the paste to the affected area, cover it with a warm cloth or gauze pad, and leave it on for 5 to 20 minutes before

## 2024-06-17 ENCOUNTER — Ambulatory Visit: Payer: Self-pay

## 2024-06-17 ENCOUNTER — Ambulatory Visit (INDEPENDENT_AMBULATORY_CARE_PROVIDER_SITE_OTHER)

## 2024-06-17 ENCOUNTER — Encounter (HOSPITAL_COMMUNITY): Payer: Self-pay

## 2024-06-17 ENCOUNTER — Ambulatory Visit (HOSPITAL_COMMUNITY)
Admission: RE | Admit: 2024-06-17 | Discharge: 2024-06-17 | Disposition: A | Discharge status: Home / Self Care | Source: Ambulatory Visit | Attending: Internal Medicine | Admitting: Internal Medicine

## 2024-06-17 VITALS — BP 137/89 | HR 78 | Temp 97.7°F | Resp 20

## 2024-06-17 DIAGNOSIS — M1811 Unilateral primary osteoarthritis of first carpometacarpal joint, right hand: Secondary | ICD-10-CM | POA: Diagnosis not present

## 2024-06-17 DIAGNOSIS — M795 Residual foreign body in soft tissue: Secondary | ICD-10-CM

## 2024-06-17 DIAGNOSIS — M19041 Primary osteoarthritis, right hand: Secondary | ICD-10-CM | POA: Diagnosis not present

## 2024-06-17 DIAGNOSIS — S60551A Superficial foreign body of right hand, initial encounter: Secondary | ICD-10-CM | POA: Diagnosis not present

## 2024-06-17 DIAGNOSIS — W458XXA Other foreign body or object entering through skin, initial encounter: Secondary | ICD-10-CM

## 2024-06-17 DIAGNOSIS — S61411A Laceration without foreign body of right hand, initial encounter: Secondary | ICD-10-CM | POA: Diagnosis not present

## 2024-06-17 NOTE — Medical Student Note (Signed)
 [employer] [facility] [employer] Insurance account manager Note For educational purposes for Medical, PA and NP students only and not part of the legal medical record.   CSN: [id number] Arrival date & time: 06/17/24  9074      History   Chief Complaint Chief Complaint  Patient presents with   Hand Pain    HPI Lisa [name] is a 78 y.o. female.  Pt has a hx of DM2, HLD, HTN. She presents with an ulceration to the R palmar surface with some pain and foreign body sensation. She cut her hand on glass 1 month previously that had completely healed initially. Over the last 2 days an ulceration appeared in the same area. She has noted some scant bloody drainage. She denies fever, chills, purulent drainage. She is concerned that she Reiling have a piece of retained glass, however, she reports there was no pain, swelling, or foreign body sensation under the skin after the initial injury. She was evaluated by Dr. Garald on 9/16 for potential foreign body under the skin. She was instructed on epsom salt soaks and considered hand surgeon referral if it did not resolve.   The history is provided by the patient.  Hand Pain    Past Medical History:  Diagnosis Date   Arthritis    Cataract 2009   right-Dr. Roz   Hyperlipidemia    Hypertension    Post-operative nausea and vomiting    Retinal tear 2008   B- Dr. Carlyle   Vitamin D  deficiency     Patient Active Problem List   Diagnosis Date Noted   Superficial glass foreign body 06/02/2024   Trigger point of left shoulder region 11/22/2023   Cat bite 12/04/2022   Shoulder pain, bilateral 02/07/2022   History of COVID-19 10/10/2021   Head contusion 07/06/2021   Piriformis muscle pain 07/07/2020   Right ankle pain 07/07/2020   Dysuria 08/24/2019   Low back pain 08/24/2019   Laceration of skin of right knee 07/12/2019   Bruit of right carotid artery 07/16/2018   Abdominal pain 01/06/2018   Right knee pain 12/21/2016   Hip flexor tightness  11/30/2015   Piriformis syndrome of left side 04/20/2014   IT band syndrome 04/20/2014   Frequent falls 04/20/2014   Diabetes mellitus type 2 without retinopathy (HCC) 02/19/2013   Well adult exam 07/13/2012   Left lumbar radiculopathy 04/08/2012   Macular hole 02/28/2012   Status post intraocular lens implant 02/28/2012   Vitamin D  deficiency 04/30/2008   Dyslipidemia 10/31/2007   RECENT RETINAL DETACHMENT PARTIAL W/GIANT TEAR 10/31/2007   Essential hypertension 10/31/2007    Past Surgical History:  Procedure Laterality Date   APPENDECTOMY  1963   CATARACT EXTRACTION     Dr. Roz   POLYPECTOMY     RETINAL TEAR REPAIR CRYOTHERAPY     Dr. Carlyle    OB History   No obstetric history on file.      Home Medications    Prior to Admission medications   Medication Sig Start Date End Date Taking? Authorizing Provider  ascorbic acid (VITAMIN C) 500 MG tablet Take by mouth.    [provider]  cholecalciferol (VITAMIN D ) 1000 UNITS tablet Take 3,000 Units by mouth every 3 (three) days.    [provider]  cyanocobalamin  1000 MCG tablet Take 1,000 mcg by mouth daily.     [provider]  gabapentin  (NEURONTIN ) 100 MG capsule Take 1 capsule (100 mg total) by mouth at bedtime. Patient not taking:  Reported on 06/02/2024 10/11/23   Claudene Arthea HERO, DO  Glucosamine HCl 500 MG TABS Take by mouth 2 (two) times daily.     [provider]  lisinopril -hydrochlorothiazide  (ZESTORETIC ) 10-12.5 MG tablet TAKE 1 TABLET BY MOUTH EVERY DAY 12/28/23   Webb, Padonda B, FNP  meloxicam  (MOBIC ) 7.5 MG tablet Take 1 tablet (7.5 mg total) by mouth daily as needed for pain. 02/25/24   [name], Karlynn GAILS, MD  metFORMIN  (GLUCOPHAGE ) 500 MG tablet TAKE 1 TABLET BY MOUTH EVERY DAY 09/29/23   [name], Aleksei V, MD  predniSONE  (DELTASONE ) 20 MG tablet Take 2 tablets (40 mg total) by mouth daily. Patient not taking: Reported on 06/02/2024 03/28/24   Doretha Folks, MD   SODIUM FLUORIDE 5000 PPM 1.1 % PSTE Take by mouth. Patient not taking: Reported on 06/02/2024 03/08/22   [provider]  Turmeric 500 MG TABS Take 2 capsules by mouth 2 (two) times daily. Patient not taking: Reported on 06/02/2024    [provider]  VASCEPA  1 g capsule TAKE 2 CAPSULES BY MOUTH TWICE A DAY 11/18/23   [name], Karlynn GAILS, MD    Family History Family History  Problem Relation Age of Onset   COPD Father    Heart disease Father    COPD Sister    Hyperlipidemia Other    Alzheimer's disease Mother    Colon cancer Maternal Aunt        74's   Stomach cancer Maternal Aunt     Social History Social History   Tobacco Use   Smoking status: Never   Smokeless tobacco: Never  Vaping Use   Vaping status: Never Used  Substance Use Topics   Alcohol use: Yes    Comment: wine occ. every week per pt   Drug use: No     Allergies   Atorvastatin, Ezetimibe-simvastatin, Fish oil, Livalo [pitavastatin calcium], Lovastatin, Rosuvastatin, and Zetia [ezetimibe]   Review of Systems Review of Systems  Constitutional:  Negative for chills and fever.  All other systems reviewed and are negative.    Physical Exam Updated Vital Signs BP 137/89 (BP Location: Right Arm)   Pulse 78   Temp 97.7 F (36.5 C) (Oral)   Resp 20   SpO2 97%   Physical Exam Constitutional:      Appearance: Normal appearance.  Cardiovascular:     Rate and Rhythm: Normal rate and regular rhythm.     Pulses: Normal pulses.     Heart sounds: Normal heart sounds.  Pulmonary:     Effort: Pulmonary effort is normal.     Breath sounds: Normal breath sounds.  Musculoskeletal:       Hands:  Neurological:     Mental Status: She is alert.      ED Treatments / Results  Labs (all labs ordered are listed, but only abnormal results are displayed) Labs Reviewed - No data to display  EKG  Radiology No results found.  Procedures Procedures (including critical care  time)  Medications Ordered in ED Medications - No data to display   Initial Impression / Assessment and Plan / ED Course  I have reviewed the triage vital signs and the nursing notes.  Pertinent labs & imaging results that were available during my care of the patient were reviewed by me and considered in my medical decision making (see chart for details).     XR of the R hand showed a 3 mmm linear radiopacity suggesting a retained foreign body.  Wound exploration was  performed using tweezers and a foreign body was able to be grasped, but on removal attempt it retracted back into the skin. Will refer pt to hand surgeon for removal of fragment. Will have patient to perform epsom salt soaks in the interm. Red flag symptoms reviewed and return precautions given.     Final Clinical Impressions(s) / ED Diagnoses   Final diagnoses:  Foreign body (FB) in soft tissue    New Prescriptions New Prescriptions   No medications on file

## 2024-06-17 NOTE — Telephone Encounter (Signed)
 FYI Only or Action Required?: Action required by provider: referral request.  Patient was last seen in primary care on 06/02/2024 by [name], Karlynn GAILS, MD.  Called Nurse Triage reporting Foreign Body in Skin.  Symptoms began several weeks ago.  Interventions attempted: OTC medications: espom salt.  Symptoms are: stable.  Triage Disposition: Information or Advice Only Call  Patient/caregiver understands and will follow disposition?: Yes    Copied from CRM #[id number]. Topic: Clinical - Red Word Triage >> Jun 17, 2024 12:01 PM [name] wrote: [facility] that prompted transfer to Nurse Triage: Patient called in stated she got some glass stuck in her hand, went to urgent care this morning and they stated she needs a referral from a hand specialist so they can take it out Reason for Disposition  [1] Follow-up call to recent contact AND [2] information only call, no triage required  Answer Assessment - Initial Assessment Questions 1. REASON FOR CALL: What is the main reason for your call? or How can I best help you?   Pt saw Dr. Garald previously and was given recommendations for glass in her hand. She did the epsom salt as directed and it started to come closer to the surface she was unable to get it. She went to UC and they did xrays and stated they could see it. They attempted to get it out and pushed it further in. Their recommendations were for her to go to a hand specialist to have it removed. They were going to put a referral in but said often it gets lost in the cloud and recommended she call her PCP.  Protocols used: Information Only Call - No Triage-A-AH

## 2024-06-17 NOTE — Discharge Instructions (Signed)
 You have a piece of glass in your right hand.  Please call your primary care provider and have them send a referral to a hand specialist/hand surgeon.  Continue Epsom salt soaks.  Use Tylenol  as needed for pain.  Watch for signs of infection such as redness, swelling, pus, pain, etc.  If you develop any new or worsening symptoms or if your symptoms do not start to improve, please return here or follow-up with your primary care provider. If your symptoms are severe, please go to the emergency room.

## 2024-06-17 NOTE — ED Provider Notes (Signed)
 [facility]    CSN: [id number] Arrival date & time: 06/17/24  9074      History   Chief Complaint Chief Complaint  Patient presents with   Hand Pain    HPI Lisa [name] is a 78 y.o. female.   Lisa [name] is a 78 y.o. female presenting for chief complaint of possible foreign body to the right hand as a result of injury that happened 1 month ago.  Patient had a laceration to the palmar ulnar portion of the right hand over the fourth metacarpal 1 month ago.  Laceration was superficial.  She was seen by PCP after initial injury who felt there to be a possible glass piece in the right hand and recommended Epsom salt soaks/hand surgery referral.  Laceration healed over the last month fully, a small ulceration to the site of injury began to reopen 5 to 6 days ago.  She has also started having pain and feels foreign body sensation to the skin at the site of previous injury.  Denies redness, swelling, pus, pain, and fever/chills.  No streaking redness to the proximal hand or wrist.  Denies reinjury of the area.  She is a type II diabetic.  Last tetanus was within the last 5 years.  She is using Epsom salt soaks intermittently.   Hand Pain    Past Medical History:  Diagnosis Date   Arthritis    Cataract 2009   right-Dr. Roz   Hyperlipidemia    Hypertension    Post-operative nausea and vomiting    Retinal tear 2008   B- Dr. Carlyle   Vitamin D  deficiency     Patient Active Problem List   Diagnosis Date Noted   Superficial glass foreign body 06/02/2024   Trigger point of left shoulder region 11/22/2023   Cat bite 12/04/2022   Shoulder pain, bilateral 02/07/2022   History of COVID-19 10/10/2021   Head contusion 07/06/2021   Piriformis muscle pain 07/07/2020   Right ankle pain 07/07/2020   Dysuria 08/24/2019   Low back pain 08/24/2019   Laceration of skin of right knee 07/12/2019   Bruit of right carotid artery 07/16/2018   Abdominal pain 01/06/2018   Right  knee pain 12/21/2016   Hip flexor tightness 11/30/2015   Piriformis syndrome of left side 04/20/2014   IT band syndrome 04/20/2014   Frequent falls 04/20/2014   Diabetes mellitus type 2 without retinopathy (HCC) 02/19/2013   Well adult exam 07/13/2012   Left lumbar radiculopathy 04/08/2012   Macular hole 02/28/2012   Status post intraocular lens implant 02/28/2012   Vitamin D  deficiency 04/30/2008   Dyslipidemia 10/31/2007   RECENT RETINAL DETACHMENT PARTIAL W/GIANT TEAR 10/31/2007   Essential hypertension 10/31/2007    Past Surgical History:  Procedure Laterality Date   APPENDECTOMY  1963   CATARACT EXTRACTION     Dr. Roz   POLYPECTOMY     RETINAL TEAR REPAIR CRYOTHERAPY     Dr. Carlyle    OB History   No obstetric history on file.      Home Medications    Prior to Admission medications   Medication Sig Start Date End Date Taking? Authorizing Provider  ascorbic acid (VITAMIN C) 500 MG tablet Take by mouth.    [provider]  cholecalciferol (VITAMIN D ) 1000 UNITS tablet Take 3,000 Units by mouth every 3 (three) days.    [provider]  cyanocobalamin  1000 MCG tablet Take 1,000 mcg by mouth daily.  [provider]  gabapentin  (NEURONTIN ) 100 MG capsule Take 1 capsule (100 mg total) by mouth at bedtime. Patient not taking: Reported on 06/02/2024 10/11/23   Smith, Zachary M, DO  Glucosamine HCl 500 MG TABS Take by mouth 2 (two) times daily.     [provider]  lisinopril -hydrochlorothiazide  (ZESTORETIC ) 10-12.5 MG tablet TAKE 1 TABLET BY MOUTH EVERY DAY 12/28/23   Webb, Padonda B, FNP  meloxicam  (MOBIC ) 7.5 MG tablet Take 1 tablet (7.5 mg total) by mouth daily as needed for pain. 02/25/24   [name], Karlynn GAILS, MD  metFORMIN  (GLUCOPHAGE ) 500 MG tablet TAKE 1 TABLET BY MOUTH EVERY DAY 09/29/23   [name], Aleksei V, MD  predniSONE  (DELTASONE ) 20 MG tablet Take 2 tablets (40 mg total) by mouth daily. Patient not taking: Reported on  06/02/2024 03/28/24   Doretha Folks, MD  SODIUM FLUORIDE 5000 PPM 1.1 % PSTE Take by mouth. Patient not taking: Reported on 06/02/2024 03/08/22   [provider]  Turmeric 500 MG TABS Take 2 capsules by mouth 2 (two) times daily. Patient not taking: Reported on 06/02/2024    [provider]  VASCEPA  1 g capsule TAKE 2 CAPSULES BY MOUTH TWICE A DAY 11/18/23   [name], Karlynn GAILS, MD    Family History Family History  Problem Relation Age of Onset   COPD Father    Heart disease Father    COPD Sister    Hyperlipidemia Other    Alzheimer's disease Mother    Colon cancer Maternal Aunt        41's   Stomach cancer Maternal Aunt     Social History Social History   Tobacco Use   Smoking status: Never   Smokeless tobacco: Never  Vaping Use   Vaping status: Never Used  Substance Use Topics   Alcohol use: Yes    Comment: wine occ. every week per pt   Drug use: No     Allergies   Atorvastatin, Ezetimibe-simvastatin, Fish oil, Livalo [pitavastatin calcium], Lovastatin, Rosuvastatin, and Zetia [ezetimibe]   Review of Systems Review of Systems Per HPI  Physical Exam Triage Vital Signs ED Triage Vitals  Encounter Vitals Group     BP 06/17/24 0940 137/89     Girls Systolic BP Percentile --      Girls Diastolic BP Percentile --      Boys Systolic BP Percentile --      Boys Diastolic BP Percentile --      Pulse Rate 06/17/24 0940 78     Resp 06/17/24 0940 20     Temp 06/17/24 0940 97.7 F (36.5 C)     Temp Source 06/17/24 0940 Oral     SpO2 06/17/24 0940 97 %     Weight --      Height --      Head Circumference --      Peak Flow --      Pain Score 06/17/24 0941 0     Pain Loc --      Pain Education --      Exclude from Growth Chart --    No data found.  Updated Vital Signs BP 137/89 (BP Location: Right Arm)   Pulse 78   Temp 97.7 F (36.5 C) (Oral)   Resp 20   SpO2 97%   Visual Acuity Right Eye Distance:   Left Eye Distance:   Bilateral  Distance:    Right Eye Near:   Left Eye Near:    Bilateral Near:  Physical Exam Vitals and nursing note reviewed.  Constitutional:      Appearance: She is not ill-appearing or toxic-appearing.  HENT:     Head: Normocephalic and atraumatic.     Right Ear: Hearing and external ear normal.     Left Ear: Hearing and external ear normal.     Nose: Nose normal.     Mouth/Throat:     Lips: Pink.  Eyes:     General: Lids are normal. Vision grossly intact. Gaze aligned appropriately.     Extraocular Movements: Extraocular movements intact.     Conjunctiva/sclera: Conjunctivae normal.  Pulmonary:     Effort: Pulmonary effort is normal.  Musculoskeletal:     Right hand: No swelling, deformity, lacerations or tenderness. Normal range of motion. Normal strength. Normal sensation. There is no disruption of two-point discrimination. Normal capillary refill. Normal pulse.     Left hand: Normal.     Cervical back: Neck supple.     Comments: Ulcerated appearing lesion to the palmar aspect of the right hand over the right proximal fourth metacarpal as seen in image below.  No surrounding soft tissue swelling/erythema/purulent drainage. Able to visualize a small piece of what appears to be glass to the radial side of the wound.  Unable to remove this glass with tweezers with attempt.   Skin:    General: Skin is warm and dry.     Capillary Refill: Capillary refill takes less than 2 seconds.     Findings: No rash.  Neurological:     General: No focal deficit present.     Mental Status: She is alert and oriented to person, place, and time. Mental status is at baseline.     Cranial Nerves: No dysarthria or facial asymmetry.  Psychiatric:        Mood and Affect: Mood normal.        Speech: Speech normal.        Behavior: Behavior normal.        Thought Content: Thought content normal.        Judgment: Judgment normal.      UC Treatments / Results  Labs (all labs ordered are listed, but  only abnormal results are displayed) Labs Reviewed - No data to display  EKG   Radiology DG Hand Complete Right Result Date: 06/17/2024 CLINICAL DATA:  Possible foreign body right hand 1 month after laceration due to glass. EXAM: RIGHT HAND - COMPLETE 3+ VIEW COMPARISON:  03/27/2014 FINDINGS: Degenerative changes over the radiocarpal joint, carpal bones and first carpometacarpal joint. Mild degenerate changes over the first through third MCP joints. Bone alignment and mineralization is normal. No acute fracture or dislocation. There is a 3 mm linear radiopacity projecting over the proximal fourth metacarpal on the frontal view and projects over the second metacarpal on the oblique view as this is likely within the soft tissues although not well visualized on the lateral view. This Berman represent a foreign body/retained glass fragment. IMPRESSION: 1. No acute fracture or dislocation. 2. 3 mm linear radiopacity as described which Schroeck represent a retained foreign body/glass fragment. Electronically Signed   By: Toribio Agreste M.D.   On: 06/17/2024 10:14    Procedures Procedures (including critical care time)  Medications Ordered in UC Medications - No data to display  Initial Impression / Assessment and Plan / UC Course  I have reviewed the triage vital signs and the nursing notes.  Pertinent labs & imaging results that were available during my care of  the patient were reviewed by me and considered in my medical decision making (see chart for details).   1.  Foreign body in soft tissue, foreign body right hand Right hand x-ray appears to show 3 mm radiopacity to the proximal fourth right metacarpal soft tissue. Attempted to remove foreign body without success. Recommend continued use of Epsom salt soaks and follow-up with hand specialist. Referral for hand specialist/hand surgeon will need to come from her primary care provider.  Advised to call Dr. Garald (PCP) to request this referral to  be made. No signs of infection currently, infection precautions have been discussed.  Counseled patient on potential for adverse effects with medications prescribed/recommended today, strict ER and return-to-clinic precautions discussed, patient verbalized understanding.    Final Clinical Impressions(s) / UC Diagnoses   Final diagnoses:  Foreign body (FB) in soft tissue  Foreign body of right hand, initial encounter     Discharge Instructions      You have a piece of glass in your right hand.  Please call your primary care provider and have them send a referral to a hand specialist/hand surgeon.  Continue Epsom salt soaks.  Use Tylenol  as needed for pain.  Watch for signs of infection such as redness, swelling, pus, pain, etc.  If you develop any new or worsening symptoms or if your symptoms do not start to improve, please return here or follow-up with your primary care provider. If your symptoms are severe, please go to the emergency room.     ED Prescriptions   None    PDMP not reviewed this encounter.   Enedelia Dorna HERO, [place] 06/17/24 1130

## 2024-06-17 NOTE — ED Triage Notes (Signed)
 Pt st's she cut her right hand in Aug on broken glass   PT st's the laceration healed without any problems but recently the area has opened up.  PT st's she can feel glass in her hand

## 2024-06-26 NOTE — Addendum Note (Signed)
 Addended by: Andrzej Scully V on: 06/26/2024 09:06 AM   Modules accepted: Orders

## 2024-06-26 NOTE — Telephone Encounter (Signed)
 Will do. Thank you

## 2024-06-29 ENCOUNTER — Other Ambulatory Visit: Payer: Self-pay

## 2024-06-29 MED ORDER — FLUZONE HIGH-DOSE 0.5 ML IM SUSY
0.5000 mL | PREFILLED_SYRINGE | Freq: Once | INTRAMUSCULAR | 0 refills | Status: AC
  Filled 2024-06-29: qty 0.5, 1d supply, fill #0

## 2024-07-03 ENCOUNTER — Other Ambulatory Visit: Payer: Self-pay | Admitting: Internal Medicine

## 2024-08-27 ENCOUNTER — Ambulatory Visit (INDEPENDENT_AMBULATORY_CARE_PROVIDER_SITE_OTHER): Payer: Medicare PPO

## 2024-08-27 VITALS — Ht 59.0 in | Wt 122.0 lb

## 2024-08-27 DIAGNOSIS — Z0000 Encounter for general adult medical examination without abnormal findings: Secondary | ICD-10-CM

## 2024-08-27 NOTE — Progress Notes (Signed)
 Chief Complaint  Patient presents with   Medicare Wellness     Subjective:  Please attest and cosign this visit due to patients primary care provider not being in the office at the time the visit was completed.  (Pt of Lisa [name])   Lisa [name] is a 78 y.o. female who presents for a Medicare Annual Wellness Visit.  Visit info / Clinical Intake: Medicare Wellness Visit Type:: Subsequent Annual Wellness Visit Persons participating in visit and providing information:: patient Medicare Wellness Visit Mode:: Telephone If telephone:: video declined Since this visit was completed virtually, some vitals [place] be partially provided or unavailable. Missing vitals are due to the limitations of the virtual format.: Documented vitals are patient reported If Telephone or Video please confirm:: I connected with patient using audio/video enable telemedicine. I verified patient identity with two identifiers, discussed telehealth limitations, and patient agreed to proceed. Patient Location:: Home Provider Location:: Office Interpreter Needed?: No Pre-visit prep was completed: yes AWV questionnaire completed by patient prior to visit?: no Living arrangements:: (!) lives alone Patient's Overall Health Status Rating: good Typical amount of pain: none Does pain affect daily life?: no Are you currently prescribed opioids?: no  Dietary Habits and Nutritional Risks How many meals a day?: 3 Eats fruit and vegetables daily?: yes Most meals are obtained by: preparing own meals In the last 2 weeks, have you had any of the following?: none Diabetic:: (!) yes Any non-healing wounds?: no How often do you check your BS?: as needed Would you like to be referred to a Nutritionist or for Diabetic Management? : no  Functional Status Activities of Daily Living (to include ambulation/medication): Independent Ambulation: Independent with device- listed below Home Assistive Devices/Equipment:  Eyeglasses Medication Administration: Independent Home Management (perform basic housework or laundry): Independent Manage your own finances?: yes Primary transportation is: driving Concerns about vision?: no *vision screening is required for WTM* Concerns about hearing?: no  Fall Screening Falls in the past year?: 0 Number of falls in past year: 0 Was there an injury with Fall?: 0 Fall Risk Category Calculator: 0 Patient Fall Risk Level: Low Fall Risk  Fall Risk Patient at Risk for Falls Due to: No Fall Risks Fall risk Follow up: Falls evaluation completed; Falls prevention discussed  Home and Transportation Safety: All rugs have non-skid backing?: N/A, no rugs All stairs or steps have railings?: (!) no Grab bars in the bathtub or shower?: (!) no Have non-skid surface in bathtub or shower?: yes Good home lighting?: yes Regular seat belt use?: yes Hospital stays in the last year:: no  Cognitive Assessment Difficulty concentrating, remembering, or making decisions? : no Will 6CIT or Mini Cog be Completed: yes What year is it?: 0 points What month is it?: 0 points Give patient an address phrase to remember (5 components): [address] [place], [place] About what time is it?: 0 points Count backwards from 20 to 1: 0 points Say the months of the year in reverse: 0 points Repeat the address phrase from earlier: 0 points 6 CIT Score: 0 points  Advance Directives (For Healthcare) Does Patient Have a Medical Advance Directive?: No Would patient like information on creating a medical advance directive?: No - Patient declined  Reviewed/Updated  Reviewed/Updated: Reviewed All (Medical, Surgical, Family, Medications, Allergies, Care Teams, Patient Goals)    Allergies (verified) Atorvastatin, Ezetimibe-simvastatin, Fish oil, Livalo [pitavastatin calcium], Lovastatin, Rosuvastatin, and Zetia [ezetimibe]   Current Medications (verified) Outpatient Encounter Medications as of  08/27/2024  Medication Sig  ascorbic acid (VITAMIN C) 500 MG tablet Take by mouth.   cholecalciferol (VITAMIN D ) 1000 UNITS tablet Take 3,000 Units by mouth every 3 (three) days.   cyanocobalamin  1000 MCG tablet Take 1,000 mcg by mouth daily.    Glucosamine HCl 500 MG TABS Take by mouth 2 (two) times daily.    lisinopril -hydrochlorothiazide  (ZESTORETIC ) 10-12.5 MG tablet TAKE 1 TABLET BY MOUTH EVERY DAY   meloxicam  (MOBIC ) 7.5 MG tablet Take 1 tablet (7.5 mg total) by mouth daily as needed for pain.   metFORMIN  (GLUCOPHAGE ) 500 MG tablet TAKE 1 TABLET EVERY DAY   SODIUM FLUORIDE 5000 PPM 1.1 % PSTE Take by mouth.   Turmeric 500 MG TABS Take 2 capsules by mouth 2 (two) times daily.   VASCEPA  1 g capsule TAKE 2 CAPSULES BY MOUTH TWICE A DAY   gabapentin  (NEURONTIN ) 100 MG capsule Take 1 capsule (100 mg total) by mouth at bedtime. (Patient not taking: Reported on 08/27/2024)   predniSONE  (DELTASONE ) 20 MG tablet Take 2 tablets (40 mg total) by mouth daily. (Patient not taking: Reported on 08/27/2024)   No facility-administered encounter medications on file as of 08/27/2024.    History: Past Medical History:  Diagnosis Date   Arthritis    Cataract 2009   right-Dr. Roz   Hyperlipidemia    Hypertension    Post-operative nausea and vomiting    Retinal tear 2008   B- Dr. Carlyle   Vitamin D  deficiency    Past Surgical History:  Procedure Laterality Date   APPENDECTOMY  1963   CATARACT EXTRACTION     Dr. Roz   POLYPECTOMY     RETINAL TEAR REPAIR CRYOTHERAPY     Dr. Carlyle   Family History  Problem Relation Age of Onset   COPD Father    Heart disease Father    COPD Sister    Hyperlipidemia Other    Alzheimer's disease Mother    Colon cancer Maternal Aunt        60's   Stomach cancer Maternal Aunt    Social History   Occupational History   Occupation: nutritionist [employer]  Tobacco Use   Smoking status: Never   Smokeless tobacco: Never  Vaping Use    Vaping status: Never Used  Substance and Sexual Activity   Alcohol use: Not Currently   Drug use: No   Sexual activity: Not Currently   Tobacco Counseling Counseling given: Not Answered  SDOH Screenings   Food Insecurity: No Food Insecurity (08/27/2024)  Housing: Unknown (08/27/2024)  Transportation Needs: No Transportation Needs (08/27/2024)  Utilities: Not At Risk (08/27/2024)  Alcohol Screen: Low Risk (08/26/2023)  Depression (PHQ2-9): Low Risk (08/27/2024)  Financial Resource Strain: Low Risk (08/26/2023)  Physical Activity: Insufficiently Active (08/27/2024)  Social Connections: Moderately Integrated (08/27/2024)  Stress: No Stress Concern Present (08/27/2024)  Tobacco Use: Low Risk (08/27/2024)  Health Literacy: Adequate Health Literacy (08/27/2024)   See flowsheets for full screening details  Depression Screen PHQ 2 & 9 Depression Scale- Over the past 2 weeks, how often have you been bothered by any of the following problems? Little interest or pleasure in doing things: 0 Feeling down, depressed, or hopeless (PHQ Adolescent also includes...irritable): 0 PHQ-2 Total Score: 0 Trouble falling or staying asleep, or sleeping too much: 0 Feeling tired or having little energy: 0 Poor appetite or overeating (PHQ Adolescent also includes...weight loss): 0 Feeling bad about yourself - or that you are a failure or have let yourself or your family down: 0 Trouble  concentrating on things, such as reading the newspaper or watching television [facility] Adolescent also includes...like school work): 0 Moving or speaking so slowly that other people could have noticed. Or the opposite - being so fidgety or restless that you have been moving around a lot more than usual: 0 Thoughts that you would be better off dead, or of hurting yourself in some way: 0 PHQ-9 Total Score: 0 If you checked off any problems, how difficult have these problems made it for you to do your work, take care of things at  home, or get along with other people?: Not difficult at all  Depression Treatment Depression Interventions/Treatment : EYV7-0 Score <4 Follow-up Not Indicated     Goals Addressed               This Visit's Progress     Patient Stated (pt-stated)        Patient stated she plans to continue manage arthritis (shoulder) discomfort and do stretching.  Joined [facility] with a Trainer             Objective:    Today's Vitals   08/27/24 0854  Weight: 122 lb (55.3 kg)  Height: 4' 11 (1.499 m)   Body mass index is 24.64 kg/m.  Hearing/Vision screen Hearing Screening - Comments:: Denies hearing difficulties   Vision Screening - Comments:: Wears rx glasses - due in 08/2024 w/Dr Greven Immunizations and Health Maintenance Health Maintenance  Topic Date Due   Diabetic kidney evaluation - Urine ACR  Never done   DTaP/Tdap/Td (2 - Tdap) 10/15/2023   COVID-19 Vaccine (4 - 2025-26 season) 05/18/2024   OPHTHALMOLOGY EXAM  05/29/2024   FOOT EXAM  08/26/2024   HEMOGLOBIN A1C  11/29/2024   Diabetic kidney evaluation - eGFR measurement  06/01/2025   Medicare Annual Wellness (AWV)  08/27/2025   Pneumococcal Vaccine: 50+ Years  Completed   Influenza Vaccine  Completed   Bone Density Scan  Completed   Hepatitis C Screening  Completed   Meningococcal B Vaccine  Aged Out   Mammogram  Discontinued   Zoster Vaccines- Shingrix  Discontinued        Assessment/Plan:  This is a routine wellness examination for [facility].  I have recommended that this patient have a Podiatry referral but she declines at this time. I have discussed the risks and benefits of this procedure with her. The patient verbalizes understanding.   Patient Care Team: [name], Karlynn GAILS, MD as PCP - General Mavis Purchase, MD (Neurosurgery) Kenn Dunnings, [place] as Referring Physician (Optometry) Pratt, Katelyn, MD (Gynecology) Abran Norleen SAILOR, MD as Consulting Physician (Gastroenterology) Cheree Banks, MD as  Referring Physician (Ophthalmology)  I have personally reviewed and noted the following in the patients chart:   Medical and social history Use of alcohol, tobacco or illicit drugs  Current medications and supplements including opioid prescriptions. Functional ability and status Nutritional status Physical activity Advanced directives List of other physicians Hospitalizations, surgeries, and ER visits in previous 12 months Vitals Screenings to include cognitive, depression, and falls Referrals and appointments  No orders of the defined types were placed in this encounter.  In addition, I have reviewed and discussed with patient certain preventive protocols, quality metrics, and best practice recommendations. A written personalized care plan for preventive services as well as general preventive health recommendations were provided to patient.   Verdie CHRISTELLA Saba, CMA   08/27/2024   Return in 1 year (on 08/27/2025).  After Visit Summary: (MyChart) Due to this being  a telephonic visit, the after visit summary with patients personalized plan was offered to patient via MyChart   Nurse Notes: Appointment(s) made: (scheduled 2026 AWV.)

## 2024-08-27 NOTE — Patient Instructions (Signed)
 Lisa [name],  Thank you for taking the time for your Medicare Wellness Visit. I appreciate your continued commitment to your health goals. Please review the care plan we discussed, and feel free to reach out if I can assist you further.  Please note that Annual Wellness Visits do not include a physical exam. Some assessments Qadir be limited, especially if the visit was conducted virtually. If needed, we Brennen recommend an in-person follow-up with your provider.  Ongoing Care Seeing your primary care provider every 3 to 6 months helps us  monitor your health and provide consistent, personalized care.   Referrals If a referral was made during today's visit and you haven't received any updates within two weeks, please contact the referred provider directly to check on the status.  Recommended Screenings:  Health Maintenance  Topic Date Due   Yearly kidney health urinalysis for diabetes  Never done   DTaP/Tdap/Td vaccine (2 - Tdap) 10/15/2023   COVID-19 Vaccine (4 - 2025-26 season) 05/18/2024   Eye exam for diabetics  05/29/2024   Complete foot exam   08/26/2024   Hemoglobin A1C  11/29/2024   Yearly kidney function blood test for diabetes  06/01/2025   Medicare Annual Wellness Visit  08/27/2025   Pneumococcal Vaccine for age over 37  Completed   Flu Shot  Completed   Osteoporosis screening with Bone Density Scan  Completed   Hepatitis C Screening  Completed   Meningitis B Vaccine  Aged Out   Breast Cancer Screening  Discontinued   Zoster (Shingles) Vaccine  Discontinued       03/28/2024    9:58 AM  Advanced Directives  Does Patient Have a Medical Advance Directive? No    Vision: Annual vision screenings are recommended for early detection of glaucoma, cataracts, and diabetic retinopathy. These exams can also reveal signs of chronic conditions such as diabetes and high blood pressure.  Dental: Annual dental screenings help detect early signs of oral cancer, gum disease, and other  conditions linked to overall health, including heart disease and diabetes.

## 2024-08-28 ENCOUNTER — Telehealth: Payer: Self-pay

## 2024-08-28 NOTE — Telephone Encounter (Signed)
 Copied from CRM #[id number]. Topic: General - Other >> Aug 28, 2024  9:06 AM [name] wrote: Reason for CRM: Patient called in regarding referral, stated she just got a call to be scheduled yesterday. Patient stated she went to the ER and got it out and its been healed, and she doesn't feel like she needs to be scheduled

## 2024-08-28 NOTE — Telephone Encounter (Signed)
 Copied from CRM #[id number]. Topic: General - Other >> Aug 28, 2024  9:06 AM [name] wrote: Reason for CRM: Patient called in regarding referral, stated she just got a call to be scheduled yesterday. Patient stated she went to the ER and got it out and its been healed, and she doesn't feel like she needs to be scheduled  [phone]

## 2024-09-02 ENCOUNTER — Other Ambulatory Visit (INDEPENDENT_AMBULATORY_CARE_PROVIDER_SITE_OTHER)

## 2024-09-02 DIAGNOSIS — E785 Hyperlipidemia, unspecified: Secondary | ICD-10-CM

## 2024-09-02 DIAGNOSIS — E119 Type 2 diabetes mellitus without complications: Secondary | ICD-10-CM | POA: Diagnosis not present

## 2024-09-02 LAB — LIPID PANEL
Cholesterol: 210 mg/dL — ABNORMAL HIGH (ref 28–200)
HDL: 50.7 mg/dL (ref >39.00)
LDL Cholesterol: 136 mg/dL — ABNORMAL HIGH (ref 10–99)
NonHDL: 159.71
Total CHOL/HDL Ratio: 4
Triglycerides: 117 mg/dL (ref 10.0–149.0)
VLDL: 23.4 mg/dL (ref 0.0–40.0)

## 2024-09-02 LAB — COMPREHENSIVE METABOLIC PANEL WITH GFR
ALT: 15 U/L (ref 3–35)
AST: 16 U/L (ref 5–37)
Albumin: 4.1 g/dL (ref 3.5–5.2)
Alkaline Phosphatase: 74 U/L (ref 39–117)
BUN: 14 mg/dL (ref 6–23)
CO2: 28 meq/L (ref 19–32)
Calcium: 9.7 mg/dL (ref 8.4–10.5)
Chloride: 99 meq/L (ref 96–112)
Creatinine, Ser: 0.55 mg/dL (ref 0.40–1.20)
GFR: 87.69 mL/min (ref >60.00)
Glucose, Bld: 95 mg/dL (ref 70–99)
Potassium: 4.3 meq/L (ref 3.5–5.1)
Sodium: 137 meq/L (ref 135–145)
Total Bilirubin: 0.4 mg/dL (ref 0.2–1.2)
Total Protein: 6.8 g/dL (ref 6.0–8.3)

## 2024-09-02 LAB — HEMOGLOBIN A1C: Hgb A1c MFr Bld: 5.9 % (ref 4.6–6.5)

## 2024-09-03 ENCOUNTER — Ambulatory Visit: Admitting: Internal Medicine

## 2024-09-03 ENCOUNTER — Encounter: Payer: Self-pay | Admitting: Internal Medicine

## 2024-09-03 VITALS — BP 144/82 | HR 81 | Temp 98.0°F | Ht 59.0 in | Wt 120.4 lb

## 2024-09-03 DIAGNOSIS — I10 Essential (primary) hypertension: Secondary | ICD-10-CM

## 2024-09-03 DIAGNOSIS — E119 Type 2 diabetes mellitus without complications: Secondary | ICD-10-CM | POA: Diagnosis not present

## 2024-09-03 DIAGNOSIS — E785 Hyperlipidemia, unspecified: Secondary | ICD-10-CM | POA: Diagnosis not present

## 2024-09-03 DIAGNOSIS — Z7984 Long term (current) use of oral hypoglycemic drugs: Secondary | ICD-10-CM | POA: Diagnosis not present

## 2024-09-03 MED ORDER — ICOSAPENT ETHYL 1 G PO CAPS: 2.0000 g | ORAL_CAPSULE | Freq: Two times a day (BID) | ORAL | 3 refills | Status: DC

## 2024-09-03 MED ORDER — LISINOPRIL-HYDROCHLOROTHIAZIDE 10-12.5 MG PO TABS: 1.0000 | ORAL_TABLET | Freq: Every day | ORAL | 3 refills | Status: AC

## 2024-09-03 NOTE — Patient Instructions (Signed)

## 2024-09-03 NOTE — Assessment & Plan Note (Signed)
Cont on Vascepa 

## 2024-09-03 NOTE — Progress Notes (Signed)
 Subjective:  Patient ID: Lisa [name], female    DOB: 1946-06-10  Age: 78 y.o. MRN: [id number]  CC: Medical Management of Chronic Issues (3 Month follow up)   HPI Lisa [name] presents for hyperglycemia, dyslipidemia, HTN  Outpatient Medications Prior to Visit  Medication Sig Dispense Refill   ascorbic acid (VITAMIN C) 500 MG tablet Take by mouth.     cholecalciferol (VITAMIN D ) 1000 UNITS tablet Take 3,000 Units by mouth every 3 (three) days.     cyanocobalamin  1000 MCG tablet Take 1,000 mcg by mouth daily.      Glucosamine HCl 500 MG TABS Take by mouth 2 (two) times daily.      meloxicam  (MOBIC ) 7.5 MG tablet Take 1 tablet (7.5 mg total) by mouth daily as needed for pain. 90 tablet 1   metFORMIN  (GLUCOPHAGE ) 500 MG tablet TAKE 1 TABLET EVERY DAY 90 tablet 3   SODIUM FLUORIDE 5000 PPM 1.1 % PSTE Take by mouth.     Turmeric 500 MG TABS Take 2 capsules by mouth 2 (two) times daily.     lisinopril -hydrochlorothiazide  (ZESTORETIC ) 10-12.5 MG tablet TAKE 1 TABLET BY MOUTH EVERY DAY 90 tablet 3   VASCEPA  1 g capsule TAKE 2 CAPSULES BY MOUTH TWICE A DAY 360 capsule 3   gabapentin  (NEURONTIN ) 100 MG capsule Take 1 capsule (100 mg total) by mouth at bedtime. (Patient not taking: Reported on 09/03/2024) 90 capsule 0   predniSONE  (DELTASONE ) 20 MG tablet Take 2 tablets (40 mg total) by mouth daily. (Patient not taking: Reported on 09/03/2024) 6 tablet 0   No facility-administered medications prior to visit.    ROS: Review of Systems  Constitutional:  Negative for activity change, appetite change, chills, fatigue and unexpected weight change.  HENT:  Negative for congestion, mouth sores and sinus pressure.   Eyes:  Negative for visual disturbance.  Respiratory:  Negative for cough and chest tightness.   Gastrointestinal:  Negative for abdominal pain and nausea.  Genitourinary:  Negative for difficulty urinating, frequency and vaginal pain.  Musculoskeletal:  Negative for back pain and  gait problem.  Skin:  Negative for pallor and rash.  Neurological:  Negative for dizziness, tremors, weakness, numbness and headaches.  Psychiatric/Behavioral:  Negative for confusion, sleep disturbance and suicidal ideas. The patient is not nervous/anxious.     Objective:  BP (!) 144/82   Pulse 81   Temp 98 F (36.7 C)   Ht 4' 11 (1.499 m)   Wt 120 lb 6.4 oz (54.6 kg)   SpO2 97%   BMI 24.32 kg/m   BP Readings from Last 3 Encounters:  09/03/24 (!) 144/82  06/17/24 137/89  06/02/24 120/62    Wt Readings from Last 3 Encounters:  09/03/24 120 lb 6.4 oz (54.6 kg)  08/27/24 122 lb (55.3 kg)  06/02/24 122 lb (55.3 kg)    Physical Exam Constitutional:      General: She is not in acute distress.    Appearance: She is well-developed. She is obese.  HENT:     Head: Normocephalic.     Right Ear: External ear normal.     Left Ear: External ear normal.     Nose: Nose normal.  Eyes:     General:        Right eye: No discharge.        Left eye: No discharge.     Conjunctiva/sclera: Conjunctivae normal.     Pupils: Pupils are equal, round, and reactive to light.  Neck:     Thyroid : No thyromegaly.     Vascular: No JVD.     Trachea: No tracheal deviation.  Cardiovascular:     Rate and Rhythm: Normal rate and regular rhythm.     Heart sounds: Normal heart sounds.  Pulmonary:     Effort: No respiratory distress.     Breath sounds: No stridor. No wheezing.  Abdominal:     General: Bowel sounds are normal. There is no distension.     Palpations: Abdomen is soft. There is no mass.     Tenderness: There is no abdominal tenderness. There is no guarding or rebound.  Musculoskeletal:        General: No tenderness.     Cervical back: Normal range of motion and neck supple. No rigidity.  Lymphadenopathy:     Cervical: No cervical adenopathy.  Skin:    Findings: No erythema or rash.  Neurological:     Cranial Nerves: No cranial nerve deficit.     Motor: No abnormal muscle tone.      Coordination: Coordination normal.     Deep Tendon Reflexes: Reflexes normal.  Psychiatric:        Behavior: Behavior normal.        Thought Content: Thought content normal.        Judgment: Judgment normal.     Lab Results  Component Value Date   WBC 6.1 06/01/2024   HGB 14.1 06/01/2024   HCT 42.7 06/01/2024   PLT 305.0 06/01/2024   GLUCOSE 95 09/02/2024   CHOL 210 (H) 09/02/2024   TRIG 117.0 09/02/2024   HDL 50.70 09/02/2024   LDLDIRECT 154.0 10/09/2021   LDLCALC 136 (H) 09/02/2024   ALT 15 09/02/2024   AST 16 09/02/2024   NA 137 09/02/2024   K 4.3 09/02/2024   CL 99 09/02/2024   CREATININE 0.55 09/02/2024   BUN 14 09/02/2024   CO2 28 09/02/2024   TSH 0.63 06/01/2024   HGBA1C 5.9 09/02/2024    DG Hand Complete Right Result Date: 06/17/2024 CLINICAL DATA:  Possible foreign body right hand 1 month after laceration due to glass. EXAM: RIGHT HAND - COMPLETE 3+ VIEW COMPARISON:  03/27/2014 FINDINGS: Degenerative changes over the radiocarpal joint, carpal bones and first carpometacarpal joint. Mild degenerate changes over the first through third MCP joints. Bone alignment and mineralization is normal. No acute fracture or dislocation. There is a 3 mm linear radiopacity projecting over the proximal fourth metacarpal on the frontal view and projects over the second metacarpal on the oblique view as this is likely within the soft tissues although not well visualized on the lateral view. This Gann represent a foreign body/retained glass fragment. IMPRESSION: 1. No acute fracture or dislocation. 2. 3 mm linear radiopacity as described which [place] represent a retained foreign body/glass fragment. Electronically Signed   By: Toribio Agreste M.D.   On: 06/17/2024 10:14    Assessment & Plan:   Problem List Items Addressed This Visit     Diabetes mellitus type 2 without retinopathy (HCC) - Primary   Monitor A1c On diet and Metformin       Relevant Medications    lisinopril -hydrochlorothiazide  (ZESTORETIC ) 10-12.5 MG tablet   Other Relevant Orders   Comprehensive metabolic panel with GFR   Hemoglobin A1c   Lipid panel   Dyslipidemia    Cont on  Vascepa       Relevant Medications   icosapent  Ethyl (VASCEPA ) 1 g capsule   Other Relevant Orders   Comprehensive metabolic  panel with GFR   Hemoglobin A1c   Lipid panel   Essential hypertension   Cont on Prinizide CT coronary calcium score 0 Re-check BP at home       Relevant Medications   lisinopril -hydrochlorothiazide  (ZESTORETIC ) 10-12.5 MG tablet   icosapent  Ethyl (VASCEPA ) 1 g capsule   Other Relevant Orders   Comprehensive metabolic panel with GFR   Hemoglobin A1c   Lipid panel      Meds ordered this encounter  Medications   DISCONTD: lisinopril -hydrochlorothiazide  (ZESTORETIC ) 10-12.5 MG tablet    Sig: Take 1 tablet by mouth daily.    Dispense:  90 tablet    Refill:  3   DISCONTD: icosapent  Ethyl (VASCEPA ) 1 g capsule    Sig: Take 2 capsules (2 g total) by mouth 2 (two) times daily.    Dispense:  360 capsule    Refill:  3   lisinopril -hydrochlorothiazide  (ZESTORETIC ) 10-12.5 MG tablet    Sig: Take 1 tablet by mouth daily.    Dispense:  90 tablet    Refill:  3   icosapent  Ethyl (VASCEPA ) 1 g capsule    Sig: Take 2 capsules (2 g total) by mouth 2 (two) times daily.    Dispense:  360 capsule    Refill:  3      Follow-up: Return in about 3 months (around 12/02/2024) for a follow-up visit.  Marolyn Noel, MD

## 2024-09-03 NOTE — Assessment & Plan Note (Signed)
 Monitor A1c On diet and Metformin

## 2024-09-03 NOTE — Assessment & Plan Note (Signed)
 Cont on Prinizide CT coronary calcium score 0 Re-check BP at home

## 2024-10-21 ENCOUNTER — Other Ambulatory Visit: Payer: Self-pay | Admitting: Internal Medicine

## 2024-12-03 ENCOUNTER — Ambulatory Visit: Admitting: Internal Medicine

## 2025-02-25 ENCOUNTER — Encounter: Admitting: Internal Medicine

## 2025-09-03 ENCOUNTER — Ambulatory Visit
